# Patient Record
Sex: Female | Born: 1960
Health system: Southern US, Community
[De-identification: ages and names within clinical notes are randomized; demographics above are authoritative.]

## PROBLEM LIST (undated history)

## (undated) DIAGNOSIS — T7840XA Allergy, unspecified, initial encounter: Secondary | ICD-10-CM

## (undated) DIAGNOSIS — K589 Irritable bowel syndrome without diarrhea: Secondary | ICD-10-CM

## (undated) DIAGNOSIS — Z8669 Personal history of other diseases of the nervous system and sense organs: Secondary | ICD-10-CM

## (undated) DIAGNOSIS — R0600 Dyspnea, unspecified: Secondary | ICD-10-CM

## (undated) DIAGNOSIS — J189 Pneumonia, unspecified organism: Secondary | ICD-10-CM

## (undated) DIAGNOSIS — I509 Heart failure, unspecified: Secondary | ICD-10-CM

## (undated) DIAGNOSIS — F410 Panic disorder [episodic paroxysmal anxiety] without agoraphobia: Secondary | ICD-10-CM

## (undated) DIAGNOSIS — G4730 Sleep apnea, unspecified: Secondary | ICD-10-CM

## (undated) DIAGNOSIS — M1990 Unspecified osteoarthritis, unspecified site: Secondary | ICD-10-CM

## (undated) DIAGNOSIS — I10 Essential (primary) hypertension: Secondary | ICD-10-CM

## (undated) DIAGNOSIS — R9431 Abnormal electrocardiogram [ECG] [EKG]: Secondary | ICD-10-CM

## (undated) DIAGNOSIS — E119 Type 2 diabetes mellitus without complications: Secondary | ICD-10-CM

## (undated) DIAGNOSIS — I639 Cerebral infarction, unspecified: Secondary | ICD-10-CM

## (undated) DIAGNOSIS — F329 Major depressive disorder, single episode, unspecified: Secondary | ICD-10-CM

## (undated) DIAGNOSIS — N200 Calculus of kidney: Secondary | ICD-10-CM

## (undated) DIAGNOSIS — F419 Anxiety disorder, unspecified: Secondary | ICD-10-CM

## (undated) DIAGNOSIS — K219 Gastro-esophageal reflux disease without esophagitis: Secondary | ICD-10-CM

## (undated) DIAGNOSIS — E785 Hyperlipidemia, unspecified: Secondary | ICD-10-CM

## (undated) DIAGNOSIS — R569 Unspecified convulsions: Secondary | ICD-10-CM

## (undated) DIAGNOSIS — K760 Fatty (change of) liver, not elsewhere classified: Secondary | ICD-10-CM

## (undated) DIAGNOSIS — F32A Depression, unspecified: Secondary | ICD-10-CM

## (undated) DIAGNOSIS — Z87442 Personal history of urinary calculi: Secondary | ICD-10-CM

## (undated) HISTORY — PX: TUMOR REMOVAL: SHX12

## (undated) HISTORY — DX: Heart failure, unspecified: I50.9

## (undated) HISTORY — DX: Type 2 diabetes mellitus without complications: E11.9

## (undated) HISTORY — DX: Anxiety disorder, unspecified: F41.9

## (undated) HISTORY — DX: Dyspnea, unspecified: R06.00

## (undated) HISTORY — DX: Depression, unspecified: F32.A

## (undated) HISTORY — DX: Unspecified convulsions: R56.9

## (undated) HISTORY — DX: Abnormal electrocardiogram (ECG) (EKG): R94.31

## (undated) HISTORY — DX: Irritable bowel syndrome, unspecified: K58.9

## (undated) HISTORY — DX: Essential (primary) hypertension: I10

## (undated) HISTORY — DX: Major depressive disorder, single episode, unspecified: F32.9

## (undated) HISTORY — DX: Sleep apnea, unspecified: G47.30

## (undated) HISTORY — DX: Cerebral infarction, unspecified: I63.9

## (undated) HISTORY — DX: Calculus of kidney: N20.0

## (undated) HISTORY — DX: Gastro-esophageal reflux disease without esophagitis: K21.9

## (undated) HISTORY — DX: Hyperlipidemia, unspecified: E78.5

## (undated) HISTORY — DX: Allergy, unspecified, initial encounter: T78.40XA

---
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
## 1999-06-30 ENCOUNTER — Encounter (INDEPENDENT_AMBULATORY_CARE_PROVIDER_SITE_OTHER): Payer: Self-pay

## 1999-06-30 ENCOUNTER — Ambulatory Visit (HOSPITAL_COMMUNITY): Admission: RE | Admit: 1999-06-30 | Discharge: 1999-06-30 | Payer: Self-pay | Admitting: Gynecology

## 1999-09-12 HISTORY — PX: HYSTEROSCOPY: SHX211

## 2000-01-10 ENCOUNTER — Other Ambulatory Visit: Admission: RE | Admit: 2000-01-10 | Discharge: 2000-01-10 | Payer: Self-pay | Admitting: Gynecology

## 2000-09-11 HISTORY — PX: TOTAL ABDOMINAL HYSTERECTOMY: SHX209

## 2000-10-27 ENCOUNTER — Inpatient Hospital Stay (HOSPITAL_COMMUNITY): Admission: AD | Admit: 2000-10-27 | Discharge: 2000-10-27 | Payer: Self-pay | Admitting: Gynecology

## 2000-10-30 ENCOUNTER — Inpatient Hospital Stay (HOSPITAL_COMMUNITY): Admission: RE | Admit: 2000-10-30 | Discharge: 2000-11-01 | Payer: Self-pay | Admitting: Gynecology

## 2000-10-30 ENCOUNTER — Encounter (INDEPENDENT_AMBULATORY_CARE_PROVIDER_SITE_OTHER): Payer: Self-pay

## 2001-06-18 ENCOUNTER — Other Ambulatory Visit: Admission: RE | Admit: 2001-06-18 | Discharge: 2001-06-18 | Payer: Self-pay | Admitting: Gynecology

## 2002-07-15 ENCOUNTER — Other Ambulatory Visit: Admission: RE | Admit: 2002-07-15 | Discharge: 2002-07-15 | Payer: Self-pay | Admitting: Gynecology

## 2003-07-21 ENCOUNTER — Other Ambulatory Visit: Admission: RE | Admit: 2003-07-21 | Discharge: 2003-07-21 | Payer: Self-pay | Admitting: Gynecology

## 2003-09-23 ENCOUNTER — Ambulatory Visit (HOSPITAL_COMMUNITY): Admission: RE | Admit: 2003-09-23 | Discharge: 2003-09-23 | Payer: Self-pay | Admitting: Neurology

## 2004-03-09 ENCOUNTER — Observation Stay (HOSPITAL_COMMUNITY): Admission: AD | Admit: 2004-03-09 | Discharge: 2004-03-10 | Payer: Self-pay | Admitting: Cardiology

## 2004-08-03 ENCOUNTER — Other Ambulatory Visit: Admission: RE | Admit: 2004-08-03 | Discharge: 2004-08-03 | Payer: Self-pay | Admitting: Gynecology

## 2005-08-30 ENCOUNTER — Other Ambulatory Visit: Admission: RE | Admit: 2005-08-30 | Discharge: 2005-08-30 | Payer: Self-pay | Admitting: Gynecology

## 2006-09-17 ENCOUNTER — Other Ambulatory Visit: Admission: RE | Admit: 2006-09-17 | Discharge: 2006-09-17 | Payer: Self-pay | Admitting: Gynecology

## 2007-10-30 ENCOUNTER — Ambulatory Visit: Payer: Self-pay | Admitting: Cardiology

## 2008-09-14 ENCOUNTER — Ambulatory Visit: Payer: Self-pay | Admitting: Cardiology

## 2008-09-15 ENCOUNTER — Ambulatory Visit: Payer: Self-pay | Admitting: Cardiology

## 2009-06-08 DIAGNOSIS — R0602 Shortness of breath: Secondary | ICD-10-CM | POA: Insufficient documentation

## 2009-06-08 DIAGNOSIS — R9431 Abnormal electrocardiogram [ECG] [EKG]: Secondary | ICD-10-CM | POA: Insufficient documentation

## 2009-06-08 DIAGNOSIS — I10 Essential (primary) hypertension: Secondary | ICD-10-CM | POA: Insufficient documentation

## 2010-03-18 ENCOUNTER — Other Ambulatory Visit: Admission: RE | Admit: 2010-03-18 | Discharge: 2010-03-18 | Payer: Self-pay | Admitting: Gynecology

## 2010-03-18 ENCOUNTER — Ambulatory Visit: Payer: Self-pay | Admitting: Gynecology

## 2010-10-02 ENCOUNTER — Encounter: Payer: Self-pay | Admitting: Gynecology

## 2011-01-24 NOTE — Assessment & Plan Note (Signed)
[facility]                          [facility] OFFICE NOTE   NAME:[name], [name] FIFITA                        MRN:          [id number]  DATE:09/14/2008                            DOB:          December 09, 1960    HISTORY OF PRESENT ILLNESS:  The patient is a 50 year old female with a  history of normal coronary arteries by prior cardiac catheterization in  2005.  Of note is that the patient has some baseline abnormal  electrocardiogram with ST elevation in inferolateral leads.  QTc was  also slightly prolonged at 470 msec.  The patient's main complaint is  shortness of breath.  She states, on minimal exertion, she becomes very  dyspneic.  She has some chest tightness, the dyspnea is not severe.  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  The patient had admission in February  2009 at [facility] for dyspnea and chest pain.  She ruled out for  myocardial infarction.  She had a stress echo done which was within  normal limits.  The patient denies any orthopnea or PND.  She has no  palpitations or syncope.  She did go see Dr. Corliss Skains, Rheumatology,  and was noted to have low vitamin D levels.  She has been supplemented  and this helped significantly with her depression.   MEDICATIONS:  1. Diovan 80 mg p.o. nightly.  2. Maxzide 37.5/25 mg half a tablet p.o. daily.  3. Metoprolol tartrate 50 mg half a tablet p.o. daily.  4. Aspirin 81 mg a day.  5. Vitamin D 50,000 units q. week.  6. Lexapro 40 mg subcu q.a.m.   PHYSICAL EXAMINATION:  VITAL SIGNS:  Blood pressure is 120/80, heart  rate 75 beats per minute, weight is 172 pounds.  NECK:  Normal carotid upstroke.  No carotid bruits.  LUNGS:  Clear breath sounds bilaterally.  HEART:  Regular rate and rhythm.  Normal S1 and S2.  No murmur, rubs, or  gallops, particularly I do not hear an S4.  ABDOMEN:  Soft, nontender.  No rebound or  guarding.  Good bowel sounds.  EXTREMITIES:  No cyanosis, clubbing, or edema.  NEUROLOGIC:  The patient is alert and oriented and grossly nonfocal.   PROBLEM LIST:  1. Dyspnea.      a.     Normal coronary arteries by catheterization in 2005.      b.     Normal echo stress test in February 2009.      c.     Rule out diastolic heart failure.      d.     Rule out deconditioning.  2. History of depression.  3. Abnormal baseline electrocardiogram with ST elevation and prolonged      QT.  4. Hypertension.  Rule out hypertensive heart disease.   PLAN:  1. I suspect the patient's dyspnea is both the combination of      deconditioning as well as hypertensive heart disease and diastolic      heart failure.  I drew a BNP  level today.  We will just obtain a      formal echocardiographic study to particularly look at the      patient's diastolic parameters.  Of note is that on chest x-ray she      has cardiomegaly which could be secondary to biatrial enlargement.  2. The patient has a baseline abnormal electrocardiogram, but this has      unchanged from prior.  Her QTc is prolonged; however, we will check      a BMET to make sure also potassium levels are within normal limits.  3. If the cardiac workup is within normal limits.  The patient should      start a gradual exercise program and should try to lose weight as      this is likely contributing to her dyspnea.     Learta Codding, MD,FACC  Electronically Signed    GED/MedQ  DD: 09/14/2008  DT: 09/14/2008  Job #: [id number]   cc:   Doreen Beam, MD

## 2011-01-27 NOTE — Discharge Summary (Signed)
[facility]  Patient:    [name] [name], [name] [name]                        MRN: [id number] Adm. Date:  [id number] Disc. Date: [id number] Attending:  Merrily Pew Dictator:   Antony Contras, N.P.                           Discharge Summary  DISCHARGE DIAGNOSES:          Leiomyomata.  PROCEDURE:                    Total abdominal hysterectomy, bilateral salpingo-oophorectomy.  HISTORY OF PRESENT ILLNESS:   Patient is a 50 year old gravida 1, para 0, AB1 with a long history of menorrhagia with multiple myomas to include a submucous myoma.  Patient had a hysteroscopic resection about a year and a half ago with a regrowth of a submucous myoma.  Patient presents at this time for definitive surgery and desires both total abdominal hysterectomy and bilateral salpingo-oophorectomy in order to avoid the possibility of any further surgery.  HOSPITAL COURSE:              Patient was admitted October 30, 2000.  TAH/BSO was performed by Dr. Audie Box assisted by Dr. Farrel Gobble under general anesthesia.  Findings include a mildly enlarged uterus.  Ovaries and tubes were normal.  The right sigmoid had some adhesions and endo puckering, but no active disease.  Postoperatively patient remained afebrile, had no difficulty voiding.  Patient was able to be discharged in satisfactory condition on her second postoperative day.  LABORATORIES:                 CBC:  Hemoglobin 10.5, hematocrit 30.4, WBC 12.2, platelets 260,000.  DISPOSITION:                  Patient to follow-up in two weeks.  She was started on an estrogen patch 0.05.  Also placed on Tylox for pain. DD:  11/19/00 TD:  11/20/00 Job: [id number]

## 2011-01-27 NOTE — Cardiovascular Report (Signed)
NAME:  [name] [name], STOECKER                           ACCOUNT NO.:  [id number]   MEDICAL RECORD NO.:  [id number]                   PATIENT TYPE:  INP   LOCATION:  3727                                 FACILITY:  [facility]   PHYSICIAN:  Rollene Rotunda, M.D.                DATE OF BIRTH:  01/24/1961   DATE OF PROCEDURE:  03/10/2004  DATE OF DISCHARGE:                              CARDIAC CATHETERIZATION   PRIMARY CARE PHYSICIAN:  Doreen Beam, MD   CARDIOLOGIST:  Rollene Rotunda, M.D.   INDICATIONS:  Patient with chest pain suggestive of unstable angina.   PROCEDURE NOTE:  Left heart catheterization was performed via the right  femoral artery. The artery was cannulated using an anterior wall puncture. A  #6 French arterial sheath was inserted via the modified Seldinger technique.  Preformed Judkins and pigtail catheter were utilized.  The patient tolerated  the procedure well and left the lab in stable condition.   RESULTS   HEMODYNAMICS:  LV 162/23, AO 161/91.   CORONARIES:  1. The left main was normal.  2. The LAD was normal.  3. There was a large first diagonal which was normal.  4. There was a circumflex which was normal.  5. There was a large OM-1 and OM-2 which were normal.  6. The right coronary artery was dominant and normal.   LEFT VENTRICULOGRAM:  The left ventriculogram was obtained in the RAO  projection. The EF was 65% with normal wall motion.   CONCLUSION:  1. Normal coronaries.  2. Normal left ventricular function.   PLAN:  No further cardiac workup is suggested. The patient will be referred  back to the primary care doctor for evaluation of nonanginal chest pain.  She will resume her previous cardiac medications.                                               Rollene Rotunda, M.D.    JH/MEDQ  D:  03/10/2004  T:  03/10/2004  Job:  [id number]   cc:   Doreen Beam  [address]  [place]  [place] [address]  Fax: [phone]

## 2011-01-27 NOTE — Discharge Summary (Signed)
NAME:  [name] [name], [name] [name]                           ACCOUNT NO.:  [id number]   MEDICAL RECORD NO.:  [id number]                   PATIENT TYPE:  INP   LOCATION:  3727                                 FACILITY:  [facility]   PHYSICIAN:  [name] [name], M.D.                DATE OF BIRTH:  08/12/1961   DATE OF ADMISSION:  03/09/2004  DATE OF DISCHARGE:  03/10/2004                                 DISCHARGE SUMMARY   PROCEDURE:  1. Cardiac catheterization.  2. Coronary arteriogram.  3. Left ventriculogram.   HOSPITAL COURSE:  [name] [name] is a 50 year old female with no known history of  coronary artery disease. She has a history of hypertension and amaurosis  fugax as well as gastroesophageal reflux disease symptoms. She went to  [facility] for chest pain and was seen there by [name] [name] who  consulted cardiology. [name] [name] saw the patient and felt that her symptoms  were suspicious for angina pectous and that she had a strong family history  for premature coronary artery disease. It was felt that she needed further  evaluation and catheterization. She was transferred to [facility].   Her enzymes were negative for MI, and she had a cardiac catheterization March 10, 2004. She had no significant coronary artery disease, and her EF was  65%. [name] [name] evaluated the films and felt that no further cardiac  workup was indicated. He felt that she was okay for discharge and could  resume her previous medications. She is to follow up with primary care and  with cardiology on a p.r.n. basis.   DISCHARGE CONDITION:  Improved.   DISCHARGE DIAGNOSES:  1. Chest pain, no significant coronary artery disease by catheterization.  2. Hypertension.  3. Neck pain with a history of cervical degenerative joint disease.  4. History of migraines.  5. History of thoracic pain.  6. Amaurosis fugax.  7. Carpal tunnel.  8. Gastroesophageal reflux disease.  9. History of vasovagal syncope in May of 2004  with hypokalemia, conversion     reaction, and gait disorder.  10.      Allergies to DOXYCYCLINE, E-MYCIN, and CODEINE.  11.      Total history.  12.      Hyperlipidemia.   DISCHARGE INSTRUCTIONS:  Her activity level was to include no driving,  sexual, or strenuous activity for two days. She is to call the office with  problems with the catheterization site. She is to stick to a low fat diet.  She is to follow up with [name] [name] as needed. She is to call [name] [name] for  an appointment.   DISCHARGE MEDICATIONS:  1. Lexapro 40 mg q.h.s.  2. Diovan 80 mg 1 half tablet q.h.s.  3. Aspirin 81 mg q.h.s.  4. Triamterene hydrochlorothiazide 37.5/25 1 half tablet q.h.s.      [name] [name], P.A. [facility]  [name] [name], M.D.    RB/MEDQ  D:  03/10/2004  T:  03/11/2004  Job:  [id number]   cc:   [name] [name]  [address]  [place]  [place] [address]  Fax: [phone]   [facility]  [place], [place]

## 2011-01-27 NOTE — H&P (Signed)
[facility]  Patient:    KELIS, PLASSE                          MRN: [id number] Adm. Date:  10/30/00 Attending:  Marcial Pacas P. Fontaine, M.D.                         History and Physical  CHIEF COMPLAINT:              Menorrhagia.  HISTORY OF PRESENT ILLNESS:   A 50 year old, G1, P0, TAB 1 female with a long history of menorrhagia, dysmenorrhea and leiomyomata. The patient underwent hysteroscopic resection of a submucosa myoma in 2000 with initial response with decreased cramping, decreased bleeding but subsequently had increasing bleeding and cramping and underwent sonohysterogram October 2001 and was found to have an enlarge uterus with multiple myomas and after saline injection had an intercavitary myoma measuring 24 x 14 mm. Options for conservative therapy with rehysteroscopic resection versus definitive surgery to include hysterectomy was discussed and the patient elected for a hysterectomy. She is being followed for portal hypertension and had previously taken oral contraceptives in an attempt to control this years past but this negatively affected her blood pressure and she cannot tolerate significant hormonal manipulation. The patient has been anemic in the past and was begun on Lupron suppression over the past several months with hemoglobin check in January of 12.9. The patient is admitted at this time for a total abdominal hysterectomy, bilateral salpingo-oophorectomy.  PAST MEDICAL HISTORY:         Hypertension.  PAST SURGICAL HISTORY:        TAB, ankle surgery, ear surgery, hysteroscopy.  CURRENT MEDICATIONS:          Maxzide 25 mg 1/2 tab daily, Lotrel 5/20 mg one tablet daily, Nu-Iron one tablet daily, Lipitor 10 mg daily.  ALLERGIES:                    ERYTHROMYCIN, DOXYCYCLINE AND CODEINE.  SOCIAL HISTORY:               Noncontributory without alcohol or tobacco use.  FAMILY HISTORY:               Noncontributory.  ADMISSION  PHYSICAL EXAM:  HEENT:                        Normal.  LUNGS:                        Clear.  CARDIAC:                      Regular rate without rubs, murmurs or gallops.  ABDOMEN:                      Benign.  PELVIC:                       External BUS, vagina grossly normal. Cervix grossly normal. Uterus anteverted, slightly enlarged, nontender. Adnexa without masses or tenderness.  ASSESSMENT:                   A 50 year old, G1, P0, AB 1 female with a long history of menorrhagia with multiple myomas to include a submucosa myoma. The patients status post hysteroscopic resection a year and a half  ago with regrowth of the submucous myoma. Options to include rehysteroscopy up to and including hysterectomy were reviewed and the patient was to proceed with hysterectomy. The ovarian conservation issue was discussed with the patient and the options of maintaining her ovaries versus removing her ovaries were discussed and the risks and benefits of both approaches reviewed. The patient understands that if she keeps both ovaries she will have continued hormonal production but does have the risk of reoperation for both benign or malignant ovarian processes. If she has her ovaries removed then the issue of estrogen replacement therapy was discussed and the potential risks of estrogen replacement therapy were reviewed to include gallbladder disease, vascular thrombosis, as well as the possible increased risk of breast cancer. The options of removing her ovaries with no hormonal replacement was also discussed and the risks long term to her health were reviewed. The patient strongly desires to have both ovaries removed to avoid any potential for surgery in the future and she plans on initiating estrogen replacement therapy afterwards. The acute intraoperative and postoperative risks of the surgery were reviewed with her to include the risks of infection both generalized internal requiring  prolonged antibiotics as well as abscess requiring opening and draining of abscess formations. The risks of wound complications to include opening and draining of wound infections and closure by secondary intention was all discussed, understood and accepted. The risks of hemorrhage necessitating transfusion and the risk of transfusion were reviewed to include transfusion reaction, hepatitis, AIDs, as well as unknown diseases such as mad cow disease all of which was understood and accepted. The risks of inadvertent injury to internal organs including bowel, bladder, ureters, vessels, and nerves necessitating major exploratory repairative surgeries and future repairative surgeries including ostomy formation was discussed with her. The absolute irreversible sterility of hysterectomy was discussed with her, understood and accepted. Lastly the issues of sexuality following hysterectomy was discussed and the possibilities for orgasmic dysfunction as well as persistent dyspareunia was reviewed, understood, and accepted. The patients questions were answered to her satisfaction and she is ready to proceed with surgery. DD:  10/25/00 TD:  10/25/00 Job: [id number]

## 2011-03-17 ENCOUNTER — Encounter: Payer: Self-pay | Admitting: Cardiology

## 2012-07-17 ENCOUNTER — Other Ambulatory Visit: Payer: Self-pay | Admitting: Gynecology

## 2012-07-17 DIAGNOSIS — Z1231 Encounter for screening mammogram for malignant neoplasm of breast: Secondary | ICD-10-CM

## 2012-07-31 ENCOUNTER — Encounter: Payer: Self-pay | Admitting: Gynecology

## 2012-08-07 ENCOUNTER — Ambulatory Visit (HOSPITAL_COMMUNITY): Payer: Self-pay

## 2012-08-21 ENCOUNTER — Ambulatory Visit (HOSPITAL_COMMUNITY): Payer: Self-pay

## 2012-08-28 ENCOUNTER — Ambulatory Visit (HOSPITAL_COMMUNITY): Payer: Self-pay

## 2013-01-13 DIAGNOSIS — R079 Chest pain, unspecified: Secondary | ICD-10-CM

## 2013-01-14 ENCOUNTER — Encounter: Payer: Self-pay | Admitting: Cardiology

## 2013-01-14 DIAGNOSIS — R079 Chest pain, unspecified: Secondary | ICD-10-CM

## 2013-10-29 ENCOUNTER — Encounter: Payer: Self-pay | Admitting: Gastroenterology

## 2013-12-02 ENCOUNTER — Ambulatory Visit: Payer: 59 | Admitting: Gastroenterology

## 2013-12-23 ENCOUNTER — Ambulatory Visit: Payer: 59 | Admitting: Gastroenterology

## 2013-12-24 ENCOUNTER — Ambulatory Visit: Payer: Self-pay | Admitting: Gynecology

## 2014-01-07 ENCOUNTER — Ambulatory Visit: Payer: Self-pay | Admitting: Gynecology

## 2014-05-11 ENCOUNTER — Encounter: Payer: Self-pay | Admitting: Gastroenterology

## 2014-07-13 ENCOUNTER — Other Ambulatory Visit (INDEPENDENT_AMBULATORY_CARE_PROVIDER_SITE_OTHER): Payer: 59

## 2014-07-13 ENCOUNTER — Ambulatory Visit (INDEPENDENT_AMBULATORY_CARE_PROVIDER_SITE_OTHER): Payer: 59 | Admitting: Gastroenterology

## 2014-07-13 ENCOUNTER — Encounter: Payer: Self-pay | Admitting: Gastroenterology

## 2014-07-13 VITALS — BP 138/90 | HR 64 | Ht 61.0 in | Wt 173.6 lb

## 2014-07-13 DIAGNOSIS — R197 Diarrhea, unspecified: Secondary | ICD-10-CM

## 2014-07-13 LAB — COMPREHENSIVE METABOLIC PANEL
ALT: 54 U/L — ABNORMAL HIGH (ref 0–35)
AST: 56 U/L — ABNORMAL HIGH (ref 0–37)
Albumin: 3.3 g/dL — ABNORMAL LOW (ref 3.5–5.2)
Alkaline Phosphatase: 101 U/L (ref 39–117)
BUN: 12 mg/dL (ref 6–23)
CO2: 30 mEq/L (ref 19–32)
Calcium: 9.3 mg/dL (ref 8.4–10.5)
Chloride: 101 mEq/L (ref 96–112)
Creatinine, Ser: 0.7 mg/dL (ref 0.4–1.2)
GFR: 88.12 mL/min (ref >60.00)
Glucose, Bld: 109 mg/dL — ABNORMAL HIGH (ref 70–99)
Potassium: 3.3 mEq/L — ABNORMAL LOW (ref 3.5–5.1)
Sodium: 138 mEq/L (ref 135–145)
Total Bilirubin: 0.5 mg/dL (ref 0.2–1.2)
Total Protein: 7 g/dL (ref 6.0–8.3)

## 2014-07-13 LAB — CBC WITH DIFFERENTIAL/PLATELET
Basophils Absolute: 0.1 10*3/uL (ref 0.0–0.1)
Basophils Relative: 0.8 % (ref 0.0–3.0)
Eosinophils Absolute: 0.2 10*3/uL (ref 0.0–0.7)
Eosinophils Relative: 2.3 % (ref 0.0–5.0)
HCT: 39.7 % (ref 36.0–46.0)
Hemoglobin: 13.3 g/dL (ref 12.0–15.0)
Lymphocytes Relative: 28.8 % (ref 12.0–46.0)
Lymphs Abs: 2.2 10*3/uL (ref 0.7–4.0)
MCHC: 33.5 g/dL (ref 30.0–36.0)
MCV: 88.6 fl (ref 78.0–100.0)
Monocytes Absolute: 0.5 10*3/uL (ref 0.1–1.0)
Monocytes Relative: 7 % (ref 3.0–12.0)
Neutro Abs: 4.8 10*3/uL (ref 1.4–7.7)
Neutrophils Relative %: 61.1 % (ref 43.0–77.0)
Platelets: 216 10*3/uL (ref 150.0–400.0)
RBC: 4.48 Mil/uL (ref 3.87–5.11)
RDW: 14.6 % (ref 11.5–15.5)
WBC: 7.8 10*3/uL (ref 4.0–10.5)

## 2014-07-13 LAB — SEDIMENTATION RATE: Sed Rate: 22 mm/hr (ref 0–22)

## 2014-07-13 MED ORDER — MOVIPREP 100 G PO SOLR: 1.0000 | Freq: Once | ORAL | Status: DC

## 2014-07-13 NOTE — Progress Notes (Signed)
HPI: This is a    Very pleasant 53 year old woman whom I am meeting for the first time today  More frequent than usual BMs (4-5 times per day) has been going for 2 years.  Usually just once per day.  Usually very loose, non-bloody.  She will take imodium about 2 times per week.  No real urgency associated.  Non bloody.  Drinks 3 sodas per day.  NO etoh.  Overall her weight has been up recently.  Never had colonoscopy  No FH of colon cancer.  Review of systems: Pertinent positive and negative review of systems were noted in the above HPI section. Complete review of systems was performed and was otherwise normal.    Past Medical History  Diagnosis Date  . Hypertension     Unspecified  . Abnormal EKG   . Dyspnea   . Depression   . Stroke     Past Surgical History  Procedure Laterality Date  . Tumor removal      from ear  . Total abdominal hysterectomy      Current Outpatient Prescriptions  Medication Sig Dispense Refill  . ALPRAZolam (XANAX) 0.5 MG tablet   5  . aspirin 81 MG tablet Take 81 mg by mouth daily.    . bisoprolol-hydrochlorothiazide (ZIAC) 10-6.25 MG per tablet   12  . FLUoxetine (PROZAC) 20 MG capsule   11  . omeprazole (PRILOSEC) 20 MG capsule   9  . verapamil (CALAN-SR) 120 MG CR tablet   11   No current facility-administered medications for this visit.    Allergies as of 07/13/2014 - Review Complete 07/13/2014  Allergen Reaction Noted  . Doxycycline Rash 07/13/2014  . Erythromycin Rash 07/13/2014    Family History  Problem Relation Age of Onset  . Coronary artery disease Paternal Grandfather   . Uterine cancer Mother   . Heart disease Maternal Grandmother   . Irritable bowel syndrome Mother     History   Social History  . Marital Status: Married    Spouse Name: N/A    Number of Children: N/A  . Years of Education: N/A   Occupational History  . Full Time     Sales   Social History Main Topics  . Smoking status: Never Smoker   .  Smokeless tobacco: Never Used  . Alcohol Use: No  . Drug Use: No  . Sexual Activity: Not on file   Other Topics Concern  . Not on file   Social History Narrative   Regular exercise: No       Physical Exam: BP 138/90 mmHg  Pulse 64  Ht 5\' 1"  (1.549 m)  Wt 173 lb 9.6 oz (78.744 kg)  BMI 32.82 kg/m2 Constitutional: generally well-appearing Psychiatric: alert and oriented x3 Eyes: extraocular movements intact Mouth: oral pharynx moist, no lesions Neck: supple no lymphadenopathy Cardiovascular: heart regular rate and rhythm Lungs: clear to auscultation bilaterally Abdomen: soft, nontender, nondistended, no obvious ascites, no peritoneal signs, normal bowel sounds Extremities: no lower extremity edema bilaterally Skin: no lesions on visible extremities    Assessment and plan: 53 y.o. female with   Frequent bowel movements for the past 2 years  She really has chronic diarrhea with multiple loose stools daily for about 2 years. I recommend we proceed with colonoscopy as well as some basic blood tests including a CBC, complete in about profile, celiac panel, sedimentation rate. She understands and wishes to proceed.

## 2014-07-13 NOTE — Patient Instructions (Addendum)
You can take take imodium daily if needed.  You have been scheduled for a colonoscopy. Please follow written instructions given to you at your visit today.  Please pick up your prep kit at the pharmacy within the next 1-3 days. If you use inhalers (even only as needed), please bring them with you on the day of your procedure. Your physician has requested that you go to www.startemmi.com and enter the access code given to you at your visit today. This web site gives a general overview about your procedure. However, you should still follow specific instructions given to you by our office regarding your preparation for the procedure.  Your physician has requested that you go to the basement for the following lab work before leaving today: CBC, CMET, Celiac 10, Sed Rate

## 2014-07-14 LAB — CELIAC PANEL 10
Endomysial Screen: NEGATIVE
Gliadin IgA: 10.7 U/mL (ref <20)
Gliadin IgG: 10.4 U/mL (ref <20)
IgA: 117 mg/dL (ref 69–380)
Tissue Transglut Ab: 15.5 U/mL (ref <20)
Tissue Transglutaminase Ab, IgA: 5.6 U/mL (ref <20)

## 2014-07-21 ENCOUNTER — Other Ambulatory Visit: Payer: Self-pay

## 2014-07-21 DIAGNOSIS — R7401 Elevation of levels of liver transaminase levels: Secondary | ICD-10-CM

## 2014-07-21 DIAGNOSIS — R740 Nonspecific elevation of levels of transaminase and lactic acid dehydrogenase [LDH]: Principal | ICD-10-CM

## 2014-07-23 ENCOUNTER — Encounter: Payer: Self-pay | Admitting: Gastroenterology

## 2014-09-14 ENCOUNTER — Encounter: Payer: 59 | Admitting: Gastroenterology

## 2014-11-04 ENCOUNTER — Telehealth: Payer: Self-pay | Admitting: Gastroenterology

## 2014-11-04 NOTE — Telephone Encounter (Signed)
Patient had called due to rectal bleeding. I had left her message to call back when I got her voicemail.

## 2014-11-13 ENCOUNTER — Encounter: Payer: Self-pay | Admitting: Gastroenterology

## 2014-11-20 ENCOUNTER — Telehealth: Payer: Self-pay | Admitting: Gastroenterology

## 2014-11-20 ENCOUNTER — Encounter: Payer: Self-pay | Admitting: Nurse Practitioner

## 2014-11-20 ENCOUNTER — Ambulatory Visit (INDEPENDENT_AMBULATORY_CARE_PROVIDER_SITE_OTHER): Payer: 59 | Admitting: Nurse Practitioner

## 2014-11-20 VITALS — BP 120/76 | HR 76 | Ht 61.0 in | Wt 173.0 lb

## 2014-11-20 DIAGNOSIS — K648 Other hemorrhoids: Secondary | ICD-10-CM

## 2014-11-20 DIAGNOSIS — K625 Hemorrhage of anus and rectum: Secondary | ICD-10-CM

## 2014-11-20 MED ORDER — HYDROCORTISONE ACETATE 25 MG RE SUPP: 25.0000 mg | Freq: Every day | RECTAL | Status: DC

## 2014-11-20 NOTE — Patient Instructions (Signed)
You have been scheduled for a colonoscopy. Please follow written instructions given to you at your visit today.  Please pick up your prep supplies at the pharmacy within the next 1-3 days. If you use inhalers (even only as needed), please bring them with you on the day of your procedure. Your physician has requested that you go to www.startemmi.com and enter the access code given to you at your visit today. This web site gives a general overview about your procedure. However, you should still follow specific instructions given to you by our office regarding your preparation for the procedure.  We have sent the following medications to your pharmacy for you to pick up at your convenience: Anusol [facility] suppositories- Insert one suppository per rectum every night x 7 nights.  If insurance will not pay, may use preparation H suppositories 1% per rectum once per night x 7 nights instead.

## 2014-11-20 NOTE — Progress Notes (Signed)
     History of Present Illness:   Patient is a 54 year old female known to Dr. Ardis Hughs. He evaluated her in November of last year for diarrhea. Labs obtained were remarkable for mild transaminitis. Her white count and hgb were normal. Diarrhea was actually more of a chronic issue. Patient was scheduled for a colonoscopy but has cancelled a few times. She is rescheduled for May but is worked in for rectal bleeding.   Patient was hospitalized in [place] late last month for generalized mid to lower abdominal and rectal bleeding. Her hemoglobin was 12. CT scan of the abdomen and pelvis negative for any acute findings per admission H &P. Patient was seen by GI apparently. Per patient, colonoscopy offered but she wanted to have it done outpatient with [place]. Patient had no further bleeding until yesterday when she saw blood in her  underwear   . Current Medications, Allergies, Past Medical History, Past Surgical History, Family History and Social History were reviewed in [employer] record.  Physical Exam: General: Pleasant, obese white female in no acute distress Head: Normocephalic and atraumatic Eyes:  sclerae anicteric, conjunctiva pink  Ears: Normal auditory acuity Lungs: Clear throughout to auscultation Heart: Regular rate and rhythm Abdomen: Soft, obese, mild LLQ tenderness. No masses, no hepatomegaly. Normal bowel sounds Rectal: External hemorrhoids. No obvious fissures. Anal vaults tight, walls felt edematous / full.  Musculoskeletal: Symmetrical with no gross deformities  Extremities: No edema  Neurological: Alert oriented x 4, grossly nonfocal Psychological:  Alert and cooperative. Normal mood and affect  Assessment and Recommendations:   54 year old female with recent admission for abdominal pain and rectal bleeding. Infectious seems unlikely in absence of diarrhea. Diverticular bleed seems unlikey given the pain. Ischemic colitis would fit though CTscan  was normal. She does have hemorrhoids which may have been ource of bleeding but that doesn't explain abdominal pain. Patient already scheduled for colonoscopy in May, will try and have this moved closer. The risks, benefits, and alternatives to colonoscopy with possible biopsy and possible polypectomy were discussed with the patient and she consents to proceed. Trial of Anusol HC supp for hemorrhoids.

## 2014-11-20 NOTE — Telephone Encounter (Signed)
Pt states she had some rectal bleeding last night and today her stomach is very bloated. Pt scheduled to see Tye Savoy NP today at 1:30pm, pt aware.

## 2014-11-21 ENCOUNTER — Telehealth: Payer: Self-pay | Admitting: Internal Medicine

## 2014-11-21 NOTE — Telephone Encounter (Signed)
Pt called Still having rectal bleeding, bright red Painful when passing stool then throbbing for some time after Using suppository recently rx'ed No abd pain, no fever, chills Expect fissure or spasm Would likely benefit from ntg 0.125% ointment TID Did not want to come to specialty pharmacy today as she lives in [place], [place] Will ask Patty or Paula/Regina to call her Monday and will defer to Dr. Ardis Hughs re: med management, colon is already scheduled per patient

## 2014-11-22 ENCOUNTER — Encounter: Payer: Self-pay | Admitting: Nurse Practitioner

## 2014-11-22 DIAGNOSIS — K625 Hemorrhage of anus and rectum: Secondary | ICD-10-CM | POA: Insufficient documentation

## 2014-11-22 DIAGNOSIS — K648 Other hemorrhoids: Secondary | ICD-10-CM | POA: Insufficient documentation

## 2014-11-22 NOTE — Telephone Encounter (Signed)
Patient called over weekend with more bleeding and now having RECTAL pain (new). She is using anusol supp for internal hemorrhoids She may have a fissure. Please call in Diltiazem gel for her to apply about one inch up into anus TID for 4 weeks. Margaree Mackintosh bath tid may help.   Thanks

## 2014-11-23 ENCOUNTER — Telehealth: Payer: Self-pay | Admitting: Nurse Practitioner

## 2014-11-23 MED ORDER — DILTIAZEM GEL 2 %: CUTANEOUS | Status: DC

## 2014-11-23 NOTE — Telephone Encounter (Signed)
See phone note from 11/21/14 for additional notes.

## 2014-11-23 NOTE — Addendum Note (Signed)
Addended by: Marlon Pel on: 11/23/2014 09:48 AM   Modules accepted: Orders

## 2014-11-23 NOTE — Telephone Encounter (Signed)
Patient notified of the recommendations.  She will call back for additional questions or concerns.

## 2014-11-23 NOTE — Progress Notes (Signed)
I agree with the above note, plan. She no showed for previous colonoscopy, we'll try to get her in sooner than her currently scheduled May colonoscopy. Should be possible.

## 2014-12-04 ENCOUNTER — Encounter (HOSPITAL_COMMUNITY): Payer: Self-pay | Admitting: Emergency Medicine

## 2014-12-04 ENCOUNTER — Emergency Department (HOSPITAL_COMMUNITY)
Admission: EM | Admit: 2014-12-04 | Discharge: 2014-12-04 | Disposition: A | Discharge status: Home / Self Care | Payer: 59 | Attending: Emergency Medicine | Admitting: Emergency Medicine

## 2014-12-04 DIAGNOSIS — Z8673 Personal history of transient ischemic attack (TIA), and cerebral infarction without residual deficits: Secondary | ICD-10-CM | POA: Diagnosis not present

## 2014-12-04 DIAGNOSIS — Z7952 Long term (current) use of systemic steroids: Secondary | ICD-10-CM | POA: Insufficient documentation

## 2014-12-04 DIAGNOSIS — Z7982 Long term (current) use of aspirin: Secondary | ICD-10-CM | POA: Insufficient documentation

## 2014-12-04 DIAGNOSIS — R109 Unspecified abdominal pain: Secondary | ICD-10-CM | POA: Diagnosis not present

## 2014-12-04 DIAGNOSIS — F329 Major depressive disorder, single episode, unspecified: Secondary | ICD-10-CM | POA: Diagnosis not present

## 2014-12-04 DIAGNOSIS — K625 Hemorrhage of anus and rectum: Secondary | ICD-10-CM

## 2014-12-04 DIAGNOSIS — I10 Essential (primary) hypertension: Secondary | ICD-10-CM | POA: Insufficient documentation

## 2014-12-04 DIAGNOSIS — K644 Residual hemorrhoidal skin tags: Secondary | ICD-10-CM | POA: Insufficient documentation

## 2014-12-04 DIAGNOSIS — Z79899 Other long term (current) drug therapy: Secondary | ICD-10-CM | POA: Insufficient documentation

## 2014-12-04 LAB — URINALYSIS, ROUTINE W REFLEX MICROSCOPIC
Bilirubin Urine: NEGATIVE
Glucose, UA: NEGATIVE mg/dL
Hgb urine dipstick: NEGATIVE
Ketones, ur: NEGATIVE mg/dL
Leukocytes, UA: NEGATIVE
Nitrite: NEGATIVE
Protein, ur: NEGATIVE mg/dL
Specific Gravity, Urine: 1.012 (ref 1.005–1.030)
Urobilinogen, UA: 0.2 mg/dL (ref 0.0–1.0)
pH: 5.5 (ref 5.0–8.0)

## 2014-12-04 LAB — COMPREHENSIVE METABOLIC PANEL
ALT: 58 U/L — ABNORMAL HIGH (ref 0–35)
AST: 70 U/L — ABNORMAL HIGH (ref 0–37)
Albumin: 4 g/dL (ref 3.5–5.2)
Alkaline Phosphatase: 107 U/L (ref 39–117)
Anion gap: 10 (ref 5–15)
BUN: 14 mg/dL (ref 6–23)
CO2: 28 mmol/L (ref 19–32)
Calcium: 9.2 mg/dL (ref 8.4–10.5)
Chloride: 103 mmol/L (ref 96–112)
Creatinine, Ser: 0.65 mg/dL (ref 0.50–1.10)
GFR calc Af Amer: >90 mL/min (ref >90)
GFR calc non Af Amer: >90 mL/min (ref >90)
Glucose, Bld: 153 mg/dL — ABNORMAL HIGH (ref 70–99)
Potassium: 3.4 mmol/L — ABNORMAL LOW (ref 3.5–5.1)
Sodium: 141 mmol/L (ref 135–145)
Total Bilirubin: 0.6 mg/dL (ref 0.3–1.2)
Total Protein: 7.2 g/dL (ref 6.0–8.3)

## 2014-12-04 LAB — CBC WITH DIFFERENTIAL/PLATELET
Basophils Absolute: 0 10*3/uL (ref 0.0–0.1)
Basophils Relative: 0 % (ref 0–1)
Eosinophils Absolute: 0.1 10*3/uL (ref 0.0–0.7)
Eosinophils Relative: 1 % (ref 0–5)
HCT: 36.8 % (ref 36.0–46.0)
Hemoglobin: 12 g/dL (ref 12.0–15.0)
Lymphocytes Relative: 31 % (ref 12–46)
Lymphs Abs: 2 10*3/uL (ref 0.7–4.0)
MCH: 29.9 pg (ref 26.0–34.0)
MCHC: 32.6 g/dL (ref 30.0–36.0)
MCV: 91.8 fL (ref 78.0–100.0)
Monocytes Absolute: 0.4 10*3/uL (ref 0.1–1.0)
Monocytes Relative: 6 % (ref 3–12)
Neutro Abs: 3.9 10*3/uL (ref 1.7–7.7)
Neutrophils Relative %: 62 % (ref 43–77)
Platelets: 209 10*3/uL (ref 150–400)
RBC: 4.01 MIL/uL (ref 3.87–5.11)
RDW: 13.1 % (ref 11.5–15.5)
WBC: 6.4 10*3/uL (ref 4.0–10.5)

## 2014-12-04 LAB — LIPASE, BLOOD: Lipase: 20 U/L (ref 11–59)

## 2014-12-04 MED ORDER — DOCUSATE SODIUM 100 MG PO CAPS: 100.0000 mg | ORAL_CAPSULE | Freq: Two times a day (BID) | ORAL | Status: DC

## 2014-12-04 MED ORDER — HYDROCORTISONE ACETATE 25 MG RE SUPP: 25.0000 mg | Freq: Every day | RECTAL | Status: DC

## 2014-12-04 NOTE — ED Notes (Addendum)
Pt from home c/o rectal bleeding intermittently x few weeks. She reports having BRB on paper when she wiped. C/o lower abdominal cramping

## 2014-12-04 NOTE — Discharge Instructions (Signed)
Rectal Bleeding Rectal bleeding is when blood passes out of the anus. It is usually a sign that something is wrong. It may not be serious, but it should always be evaluated. Rectal bleeding may present as bright red blood or extremely dark stools. The color may range from dark red or maroon to black (like tar). It is important that the cause of rectal bleeding be identified so treatment can be started and the problem corrected. CAUSES   Hemorrhoids. These are enlarged (dilated) blood vessels or veins in the anal or rectal area.  Fistulas. Theseare abnormal, burrowing channels that usually run from inside the rectum to the skin around the anus. They can bleed.  Anal fissures. This is a tear in the tissue of the anus. Bleeding occurs with bowel movements.  Diverticulosis. This is a condition in which pockets or sacs project from the bowel wall. Occasionally, the sacs can bleed.  Diverticulitis. Thisis an infection involving diverticulosis of the colon.  Proctitis and colitis. These are conditions in which the rectum, colon, or both, can become inflamed and pitted (ulcerated).  Polyps and cancer. Polyps are non-cancerous (benign) growths in the colon that may bleed. Certain types of polyps turn into cancer.  Protrusion of the rectum. Part of the rectum can project from the anus and bleed.  Certain medicines.  Intestinal infections.  Blood vessel abnormalities. HOME CARE INSTRUCTIONS  Eat a high-fiber diet to keep your stool soft.  Limit activity.  Drink enough fluids to keep your urine clear or pale yellow.  Warm baths may be useful to soothe rectal pain.  Follow up with your caregiver as directed. SEEK IMMEDIATE MEDICAL CARE IF:  You develop increased bleeding.  You have black or dark red stools.  You vomit blood or material that looks like coffee grounds.  You have abdominal pain or tenderness.  You have a fever.  You feel weak, nauseous, or you faint.  You have  severe rectal pain or you are unable to have a bowel movement. MAKE SURE YOU:  Understand these instructions.  Will watch your condition.  Will get help right away if you are not doing well or get worse. Document Released: 02/17/2002 Document Revised: 11/20/2011 Document Reviewed: 02/12/2011 [facility] Patient Information 2015 [place], [place]. This information is not intended to replace advice given to you by your health care provider. Make sure you discuss any questions you have with your health care provider. See your Gi doctor for evaluation. Try Recticare over the counter to help discomfort.

## 2014-12-04 NOTE — ED Notes (Signed)
Pt comes from home where she lives with her husband with c/o rectal bleeding x4 weeks, worsening today.  Pt was admitted to [facility] for rectal bleeding @ 4 weeks ago and evaluated overnight.  Patient states scans were negative and she was referred to [facility].  Patient has followed up with them x1 and was prescribed diltiazem gel.  Pt states she has been using that as prescribed.  Patient states this afternoon she had a BM with a clot of medium red blood.  Patient also endorses diarrhea.  Patient denies N/V and fever.  On exam, patient's lung sounds are clear in all lobes.  Heart sounds WNL, S1/S2, no gallop, rub or murmur.  +3 radial and pedal pulses.  Patient's abdomen soft and tender to palpation, >periumbilical.  Bowel sounds hypoactive.  Mucus membranes are moist.  No pallor noted to conjunctiva or gums.

## 2014-12-04 NOTE — ED Provider Notes (Signed)
CSN: [id number]     Arrival date & time 12/04/14  1402 History   First MD Initiated Contact with Patient 12/04/14 1513     Chief Complaint  Patient presents with  . Rectal Bleeding  . Abdominal Pain     (Consider location/radiation/quality/duration/timing/severity/associated sxs/prior Treatment) Patient is a 54 y.o. female presenting with hematochezia and abdominal pain. The history is provided by the patient. No language interpreter was used.  Rectal Bleeding Quality:  Bright red Duration:  4 weeks Timing:  Sporadic Progression:  Worsening Chronicity:  Recurrent Context: hemorrhoids and rectal pain   Pain details:    Quality:  Aching   Severity:  Moderate   Duration:  4 weeks   Timing:  Constant   Progression:  Worsening Similar prior episodes: no   Relieved by:  Nothing Associated symptoms: abdominal pain   Risk factors: no anticoagulant use   Abdominal Pain Associated symptoms: hematochezia     Past Medical History  Diagnosis Date  . Hypertension     Unspecified  . Abnormal EKG   . Dyspnea   . Depression   . Stroke    Past Surgical History  Procedure Laterality Date  . Tumor removal      from ear  . Total abdominal hysterectomy     Family History  Problem Relation Age of Onset  . Coronary artery disease Paternal Grandfather   . Uterine cancer Mother   . Heart disease Maternal Grandmother   . Irritable bowel syndrome Mother    History  Substance Use Topics  . Smoking status: Never Smoker   . Smokeless tobacco: Never Used  . Alcohol Use: No   OB History    No data available     Review of Systems  Gastrointestinal: Positive for abdominal pain, blood in stool, hematochezia and rectal pain.  All other systems reviewed and are negative.     Allergies  Doxycycline and Erythromycin  Home Medications   Prior to Admission medications   Medication Sig Start Date End Date Taking? Authorizing Provider  ALPRAZolam Duanne Moron) 0.5 MG tablet Take 0.5 mg  by mouth 3 (three) times daily as needed for anxiety.  05/19/14  Yes Historical Provider, MD  aspirin 81 MG tablet Take 81 mg by mouth daily.   Yes Historical Provider, MD  bisoprolol-hydrochlorothiazide (ZIAC) 10-6.25 MG per tablet Take 1 tablet by mouth every morning.  07/02/14  Yes Historical Provider, MD  diltiazem 2 % GEL Apply one inch into anus three times a day for 4 weeks. 11/23/14  Yes Willia Craze, NP  FLUoxetine (PROZAC) 20 MG capsule Take 60 mg by mouth every morning.  06/24/14  Yes Historical Provider, MD  omeprazole (PRILOSEC) 20 MG capsule Take 20 mg by mouth at bedtime.  07/02/14  Yes Historical Provider, MD  verapamil (CALAN-SR) 120 MG CR tablet Take 120 mg by mouth at bedtime.  07/02/14  Yes Historical Provider, MD  hydrocortisone (ANUSOL-HC) 25 MG suppository Place 1 suppository (25 mg total) rectally at bedtime. Patient not taking: Reported on 12/04/2014 11/20/14   Willia Craze, NP  MOVIPREP 100 G SOLR Take 1 kit (200 g total) by mouth once. Patient not taking: Reported on 12/04/2014 07/13/14   Milus Banister, MD   BP 160/93 mmHg  Pulse 72  Temp(Src) 97.7 F (36.5 C) (Oral)  Resp 18  SpO2 98% Physical Exam  Constitutional: She is oriented to person, place, and time. She appears well-developed and well-nourished.  HENT:  Head: Normocephalic and atraumatic.  Eyes: Pupils are equal, round, and reactive to light.  Neck: Normal range of motion.  Cardiovascular: Normal rate and normal heart sounds.   Pulmonary/Chest: Effort normal.  Abdominal: Soft. There is no tenderness.  Genitourinary:  Rectal  Large external non thrombosed hemorrhoids, no gross blood, rectal exam palp internal hemorrhoids, painful,   Musculoskeletal: Normal range of motion.  Neurological: She is alert and oriented to person, place, and time.  Skin: Skin is warm.  Psychiatric: She has a normal mood and affect.  Nursing note and vitals reviewed.   ED Course  Procedures (including critical care  time) Labs Review Labs Reviewed  COMPREHENSIVE METABOLIC PANEL - Abnormal; Notable for the following:    Potassium 3.4 (*)    Glucose, Bld 153 (*)    AST 70 (*)    ALT 58 (*)    All other components within normal limits  CBC WITH DIFFERENTIAL/PLATELET  LIPASE, BLOOD  URINALYSIS, ROUTINE W REFLEX MICROSCOPIC    Imaging Review No results found.   EKG Interpretation None      MDM  I spoke to Dr. Hoyt Koch on call for Dr. Ardis Hughs.  She advised Recticare, anusol suppositories, colace and sitz baths.   Pt advised to call her Gi doctor on Monday to be seen   Final diagnoses:  Rectal bleeding    Results for orders placed or performed during the hospital encounter of 12/04/14  CBC with Differential  Result Value Ref Range   WBC 6.4 4.0 - 10.5 K/uL   RBC 4.01 3.87 - 5.11 MIL/uL   Hemoglobin 12.0 12.0 - 15.0 g/dL   HCT 36.8 36.0 - 46.0 %   MCV 91.8 78.0 - 100.0 fL   MCH 29.9 26.0 - 34.0 pg   MCHC 32.6 30.0 - 36.0 g/dL   RDW 13.1 11.5 - 15.5 %   Platelets 209 150 - 400 K/uL   Neutrophils Relative % 62 43 - 77 %   Neutro Abs 3.9 1.7 - 7.7 K/uL   Lymphocytes Relative 31 12 - 46 %   Lymphs Abs 2.0 0.7 - 4.0 K/uL   Monocytes Relative 6 3 - 12 %   Monocytes Absolute 0.4 0.1 - 1.0 K/uL   Eosinophils Relative 1 0 - 5 %   Eosinophils Absolute 0.1 0.0 - 0.7 K/uL   Basophils Relative 0 0 - 1 %   Basophils Absolute 0.0 0.0 - 0.1 K/uL  Comprehensive metabolic panel  Result Value Ref Range   Sodium 141 135 - 145 mmol/L   Potassium 3.4 (L) 3.5 - 5.1 mmol/L   Chloride 103 96 - 112 mmol/L   CO2 28 19 - 32 mmol/L   Glucose, Bld 153 (H) 70 - 99 mg/dL   BUN 14 6 - 23 mg/dL   Creatinine, Ser 0.65 0.50 - 1.10 mg/dL   Calcium 9.2 8.4 - 10.5 mg/dL   Total Protein 7.2 6.0 - 8.3 g/dL   Albumin 4.0 3.5 - 5.2 g/dL   AST 70 (H) 0 - 37 U/L   ALT 58 (H) 0 - 35 U/L   Alkaline Phosphatase 107 39 - 117 U/L   Total Bilirubin 0.6 0.3 - 1.2 mg/dL   GFR calc non Af Amer >90 >90 mL/min   GFR calc Af  Amer >90 >90 mL/min   Anion gap 10 5 - 15  Lipase, blood  Result Value Ref Range   Lipase 20 11 - 59 U/L  Urinalysis, Routine w reflex microscopic  Result Value Ref Range  Color, Urine YELLOW YELLOW   APPearance CLEAR CLEAR   Specific Gravity, Urine 1.012 1.005 - 1.030   pH 5.5 5.0 - 8.0   Glucose, UA NEGATIVE NEGATIVE mg/dL   Hgb urine dipstick NEGATIVE NEGATIVE   Bilirubin Urine NEGATIVE NEGATIVE   Ketones, ur NEGATIVE NEGATIVE mg/dL   Protein, ur NEGATIVE NEGATIVE mg/dL   Urobilinogen, UA 0.2 0.0 - 1.0 mg/dL   Nitrite NEGATIVE NEGATIVE   Leukocytes, UA NEGATIVE NEGATIVE   No results found. Meds ordered this encounter  Medications  . hydrocortisone (ANUSOL-HC) 25 MG suppository    Sig: Place 1 suppository (25 mg total) rectally at bedtime.    Dispense:  7 suppository    Refill:  0    Order Specific Question:  Supervising Provider    Answer:  MILLER, BRIAN [[id number]]  . docusate sodium (COLACE) 100 MG capsule    Sig: Take 1 capsule (100 mg total) by mouth every 12 (twelve) hours.    Dispense:  60 capsule    Refill:  0    Order Specific Question:  Supervising Provider    Answer:  Noemi Chapel [[id number]]      Hollace Kinnier [place], PA-C 12/04/14 1639  Debby Freiberg, MD 12/05/14 [phone]

## 2014-12-04 NOTE — ED Notes (Signed)
Nurse drawing labs. 

## 2014-12-07 ENCOUNTER — Telehealth: Payer: Self-pay | Admitting: Gastroenterology

## 2014-12-07 NOTE — Telephone Encounter (Signed)
Pt was seen in the ER over the holiday for rectal bleeding. Pt states she is still bleeding and would like to be seen. Pt scheduled to see Alonza Bogus PA tomorrow at 2:30pm. Pt aware of appt.

## 2014-12-08 ENCOUNTER — Telehealth: Payer: Self-pay | Admitting: *Deleted

## 2014-12-08 ENCOUNTER — Encounter: Payer: Self-pay | Admitting: Gastroenterology

## 2014-12-08 ENCOUNTER — Ambulatory Visit (INDEPENDENT_AMBULATORY_CARE_PROVIDER_SITE_OTHER): Payer: 59 | Admitting: Gastroenterology

## 2014-12-08 VITALS — BP 160/90 | HR 88 | Ht 61.0 in | Wt 175.2 lb

## 2014-12-08 DIAGNOSIS — K625 Hemorrhage of anus and rectum: Secondary | ICD-10-CM | POA: Diagnosis not present

## 2014-12-08 DIAGNOSIS — K648 Other hemorrhoids: Secondary | ICD-10-CM

## 2014-12-08 DIAGNOSIS — K644 Residual hemorrhoidal skin tags: Secondary | ICD-10-CM | POA: Insufficient documentation

## 2014-12-08 NOTE — Patient Instructions (Addendum)
We have given you a work note for Monday 12-07-2014.   We have given you rectal care instructions/Sitz bath ,  We will call you with the appointment for a consult with [facility] for external hemorrhoids.

## 2014-12-08 NOTE — Progress Notes (Signed)
     12/08/2014 [name] [name] [id number] 03/09/1961   History of Present Illness:  This is a 54 year old female who was seen here earlier this month by our NP for rectal bleeding.  Has never undergone colonoscopy and that is scheduled with Dr. Ardis Hughs for 4/8.  She was examined and was given hydrocortisone suppositories to use.  She says that despite those she continues to have pain and bleeding.  Passed a clot the other day and went to the ED; Hgb at that time was 12.0 grams.  She called back here at one point and was given diltiazem gel to use as well.  She says that she is frustrated and wants something done with her hemorrhoids.  Says that it feels like there is a ball between her butt-cheeks because of the hemorrhoids.   Current Medications, Allergies, Past Medical History, Past Surgical History, Family History and Social History were reviewed in [employer] record.   Physical Exam: BP 160/90 mmHg  Pulse 88  Ht 5\' 1"  (1.549 m)  Wt 175 lb 4 oz (79.493 kg)  BMI 33.13 kg/m2 General: Well developed white female in no acute distress; very anxious Head: Normocephalic and atraumatic Eyes:  Sclerae anicteric, conjunctiva pink  Ears: Normal auditory acuity Rectal:  Large external hemorrhoids noted.  Some discomfort on DRE posteriorly.  No stool or blood noted on exam glove. Musculoskeletal: Symmetrical with no gross deformities  Extremities: No edema  Neurological: Alert oriented x 4, grossly non-focal Psychological:  Alert and cooperative. Normal mood and affect  Assessment and Recommendations: -54 year old female with rectal bleeding and pain:  She does have large external hemorrhoids on exam and no doubt could be causing her some discomfort.  The bleeding and actual pain may be coming from internal hemorrhoids and possibly an anal fissure.  Will continue diltiazem gel 2% TID and hydrocortisone suppositories at bedtime for now along with sitz baths.  Proceed with  colonoscopy on 4/8 with Dr. Ardis Hughs to rule out other sources of bleeding as well.  She is adamant about having something done with her hemorrhoids.  Will refer to [facility] as well for evaluation.  Ok to give a work note for missing work yesterday due to her recent issues.

## 2014-12-08 NOTE — Telephone Encounter (Signed)
Called pt to inform her the consult with [facility] is scheduled for 12-28-2014 . She is to arrive at 8:30. Will have to fill out some patient paperwork. Pt verbalized understanding.She will see Dr. Rosendo Gros.

## 2014-12-09 NOTE — Progress Notes (Signed)
i agree with the above note, plan 

## 2014-12-18 ENCOUNTER — Encounter: Payer: Self-pay | Admitting: Gastroenterology

## 2014-12-18 ENCOUNTER — Ambulatory Visit (AMBULATORY_SURGERY_CENTER): Payer: 59 | Admitting: Gastroenterology

## 2014-12-18 VITALS — BP 138/62 | HR 66 | Temp 97.4°F | Resp 18 | Ht 61.0 in | Wt 175.0 lb

## 2014-12-18 DIAGNOSIS — D125 Benign neoplasm of sigmoid colon: Secondary | ICD-10-CM | POA: Diagnosis not present

## 2014-12-18 DIAGNOSIS — K648 Other hemorrhoids: Secondary | ICD-10-CM

## 2014-12-18 DIAGNOSIS — D120 Benign neoplasm of cecum: Secondary | ICD-10-CM

## 2014-12-18 DIAGNOSIS — K644 Residual hemorrhoidal skin tags: Secondary | ICD-10-CM

## 2014-12-18 DIAGNOSIS — Z1211 Encounter for screening for malignant neoplasm of colon: Secondary | ICD-10-CM | POA: Diagnosis present

## 2014-12-18 MED ORDER — SODIUM CHLORIDE 0.9 % IV SOLN: 500.0000 mL | INTRAVENOUS | Status: DC

## 2014-12-18 NOTE — Patient Instructions (Signed)
Impressions/recommendations:  Polyps (handout given) Hemorrhoids (handout given)  YOU HAD AN ENDOSCOPIC PROCEDURE TODAY AT [place]:   Refer to the procedure report that was given to you for any specific questions about what was found during the examination.  If the procedure report does not answer your questions, please call your gastroenterologist to clarify.  If you requested that your care partner not be given the details of your procedure findings, then the procedure report has been included in a sealed envelope for you to review at your convenience later.  YOU SHOULD EXPECT: Some feelings of bloating in the abdomen. Passage of more gas than usual.  Walking can help get rid of the air that was put into your GI tract during the procedure and reduce the bloating. If you had a lower endoscopy (such as a colonoscopy or flexible sigmoidoscopy) you may notice spotting of blood in your stool or on the toilet paper. If you underwent a bowel prep for your procedure, you may not have a normal bowel movement for a few days.  Please Note:  You might notice some irritation and congestion in your nose or some drainage.  This is from the oxygen used during your procedure.  There is no need for concern and it should clear up in a day or so.  SYMPTOMS TO REPORT IMMEDIATELY:   Following lower endoscopy (colonoscopy or flexible sigmoidoscopy):  Excessive amounts of blood in the stool  Significant tenderness or worsening of abdominal pains  Swelling of the abdomen that is new, acute  Fever of 100F or higher   For urgent or emergent issues, a gastroenterologist can be reached at any hour by calling [phone].   DIET: Your first meal following the procedure should be a small meal and then it is ok to progress to your normal diet. Heavy or fried foods are harder to digest and may make you feel nauseous or bloated.  Likewise, meals heavy in dairy and vegetables can increase bloating.   Drink plenty of fluids but you should avoid alcoholic beverages for 24 hours.  ACTIVITY:  You should plan to take it easy for the rest of today and you should NOT DRIVE or use heavy machinery until tomorrow (because of the sedation medicines used during the test).    FOLLOW UP: Our staff will call the number listed on your records the next business day following your procedure to check on you and address any questions or concerns that you may have regarding the information given to you following your procedure. If we do not reach you, we will leave a message.  However, if you are feeling well and you are not experiencing any problems, there is no need to return our call.  We will assume that you have returned to your regular daily activities without incident.  If any biopsies were taken you will be contacted by phone or by letter within the next 1-3 weeks.  Please call us at [phone] if you have not heard about the biopsies in 3 weeks.    SIGNATURES/CONFIDENTIALITY: You and/or your care partner have signed paperwork which will be entered into your electronic medical record.  These signatures attest to the fact that that the information above on your After Visit Summary has been reviewed and is understood.  Full responsibility of the confidentiality of this discharge information lies with you and/or your care-partner.

## 2014-12-18 NOTE — Progress Notes (Signed)
A/ox3 pleased with MAC, report to Tracy RN 

## 2014-12-18 NOTE — Progress Notes (Signed)
Called to room to assist during endoscopic procedure.  Patient ID and intended procedure confirmed with present staff. Received instructions for my participation in the procedure from the performing physician.  

## 2014-12-18 NOTE — Op Note (Signed)
[facility]  [address] [place], [address]   COLONOSCOPY PROCEDURE REPORT  PATIENT: [name] [name], [name] [name]  MR#: [id number] BIRTHDATE: [birth year] , 53  yrs. old GENDER: female ENDOSCOPIST: Milus Banister, MD REFERRED QB:[id number] Woody Seller, M.D. PROCEDURE DATE:  12/18/2014 PROCEDURE:   Colonoscopy with snare polypectomy First Screening Colonoscopy - Avg.  risk and is 50 yrs.  old or older Yes.  Prior Negative Screening - Now for repeat screening. N/A  History of Adenoma - Now for follow-up colonoscopy & has been > or = to 3 yrs.  N/A ASA CLASS:   Class II INDICATIONS:Screening for colonic neoplasia. MEDICATIONS: Monitored anesthesia care and Propofol 200 mg IV  DESCRIPTION OF PROCEDURE:   After the risks benefits and alternatives of the procedure were thoroughly explained, informed consent was obtained.  The digital rectal exam revealed no abnormalities of the rectum.   The LB [id number]  endoscope was introduced through the anus and advanced to the cecum, which was identified by both the appendix and ileocecal valve. No adverse events experienced.   The quality of the prep was excellent.  The instrument was then slowly withdrawn as the colon was fully examined.  COLON FINDINGS: Fourpolyps ranging between 3-24mm in size were found in the sigmoid colon and at the cecum.  The largest was in the sigmoid, was pedunculated and was removed with snare/cautery (jar 2) . The otherwse were all sessile, removed with cold snare (jar 1).  The resection was complete, the polyp tissue was completely retrieved and sent to histology.   Large external and small internal hemorrhoids were found.  Retroflexed views revealed no abnormalities. The time to cecum = 2.8 Withdrawal time = 11.6   The scope was withdrawn and the procedure completed. COMPLICATIONS: There were no immediate complications.  ENDOSCOPIC IMPRESSION: 1.  Four sessile polyps ranging between 3-1mm in size were found  in the sigmoid colon and at the cecum; polypectomies were performed with a cold snare 2.   External and internal hemorrhoids  RECOMMENDATIONS: 1. If the polyp(s) removed today are proven to be adenomatous (pre-cancerous) polyps, you will need a colonoscopy in 3 years. Otherwise you should continue to follow colorectal cancer screening guidelines for "routine risk" patients with a colonoscopy in 10 years.  You will receive a letter within 1-2 weeks with the results of your biopsy as well as final recommendations.  Please call my office if you have not received a letter after 3 weeks. 2. Please continue with general surgery appt to discuss your large external hemorrhoids.  eSigned:  Milus Banister, MD 12/18/2014 2:56 PM

## 2014-12-21 ENCOUNTER — Telehealth: Payer: Self-pay | Admitting: *Deleted

## 2014-12-21 NOTE — Telephone Encounter (Signed)
  Follow up Call-  Call back number 12/18/2014  Post procedure Call Back phone  # [phone]  Permission to leave phone message Yes     Patient questions:  Do you have a fever, pain , or abdominal swelling? No. Pain Score  0 *  Have you tolerated food without any problems? Yes.    Have you been able to return to your normal activities? Yes.    Do you have any questions about your discharge instructions: Diet   No. Medications  No. Follow up visit  No.  Do you have questions or concerns about your Care? No.  Actions: * If pain score is 4 or above: No action needed, pain <4.

## 2014-12-24 ENCOUNTER — Encounter: Payer: Self-pay | Admitting: Gastroenterology

## 2015-01-25 ENCOUNTER — Encounter: Payer: Self-pay | Admitting: Gastroenterology

## 2015-02-19 NOTE — Telephone Encounter (Signed)
Pt seen

## 2015-03-29 ENCOUNTER — Telehealth: Payer: Self-pay | Admitting: Gastroenterology

## 2015-03-29 NOTE — Telephone Encounter (Signed)
See alternate note  

## 2015-03-29 NOTE — Telephone Encounter (Signed)
Pt cx her surgical consult, she was advised to follow up with that office.  I offered to call [facility] for her and she states she will call herself and get that set up.  Pt was given the phone number and told to call back with any concerns or problems

## 2015-09-08 ENCOUNTER — Telehealth: Payer: Self-pay | Admitting: Gastroenterology

## 2015-09-08 NOTE — Telephone Encounter (Signed)
Call placed to [facility] and office note to be faxed to the office.

## 2015-09-08 NOTE — Telephone Encounter (Signed)
Dr Ardis Hughs the pt is continuing to have rectal bleeding and cramps with external hemorrhoids.  I have placed the last [facility] office note on your desk for review.  She was last seen in our office on 12/18/14.  Do you want her to be seen by extender or your next available?

## 2015-09-08 NOTE — Telephone Encounter (Signed)
Per last note pt was referred to [facility] for rectal bleeding, she cx the appt.  I advised her when she last called to follow up with [facility].  Message left for her to return call and to confirm if she saw [facility].

## 2015-09-08 NOTE — Telephone Encounter (Signed)
Pt said she talked with [facility] and they said they do not recommend surgery at this time

## 2015-09-09 NOTE — Telephone Encounter (Signed)
Yes, offer her both (my next avail and next extender appt, whichever she prefers)  thanks

## 2015-09-09 NOTE — Telephone Encounter (Signed)
The pt has been scheduled for 11/08/15 with Dr Ardis Hughs per her preference.

## 2015-09-30 ENCOUNTER — Telehealth: Payer: Self-pay | Admitting: Gastroenterology

## 2015-09-30 ENCOUNTER — Other Ambulatory Visit (INDEPENDENT_AMBULATORY_CARE_PROVIDER_SITE_OTHER): Payer: 59

## 2015-09-30 ENCOUNTER — Encounter: Payer: Self-pay | Admitting: Gastroenterology

## 2015-09-30 ENCOUNTER — Ambulatory Visit (INDEPENDENT_AMBULATORY_CARE_PROVIDER_SITE_OTHER): Payer: 59 | Admitting: Gastroenterology

## 2015-09-30 VITALS — BP 144/84 | HR 70 | Ht 61.0 in | Wt 168.0 lb

## 2015-09-30 DIAGNOSIS — K644 Residual hemorrhoidal skin tags: Secondary | ICD-10-CM

## 2015-09-30 DIAGNOSIS — K648 Other hemorrhoids: Secondary | ICD-10-CM

## 2015-09-30 LAB — CBC WITH DIFFERENTIAL/PLATELET
Basophils Absolute: 0 10*3/uL (ref 0.0–0.1)
Basophils Relative: 0.4 % (ref 0.0–3.0)
Eosinophils Absolute: 0.1 10*3/uL (ref 0.0–0.7)
Eosinophils Relative: 1.2 % (ref 0.0–5.0)
HCT: 36.2 % (ref 36.0–46.0)
Hemoglobin: 12.1 g/dL (ref 12.0–15.0)
Lymphocytes Relative: 33.2 % (ref 12.0–46.0)
Lymphs Abs: 1.8 10*3/uL (ref 0.7–4.0)
MCHC: 33.3 g/dL (ref 30.0–36.0)
MCV: 85.6 fl (ref 78.0–100.0)
Monocytes Absolute: 0.4 10*3/uL (ref 0.1–1.0)
Monocytes Relative: 7.6 % (ref 3.0–12.0)
Neutro Abs: 3.1 10*3/uL (ref 1.4–7.7)
Neutrophils Relative %: 57.6 % (ref 43.0–77.0)
Platelets: 155 10*3/uL (ref 150.0–400.0)
RBC: 4.23 Mil/uL (ref 3.87–5.11)
RDW: 14 % (ref 11.5–15.5)
WBC: 5.4 10*3/uL (ref 4.0–10.5)

## 2015-09-30 NOTE — Telephone Encounter (Signed)
[name] [name] I found this note open in the chart nothing was documented in it.  The pt left a message on my voice mail that she missed you call as well

## 2015-09-30 NOTE — Progress Notes (Signed)
Review of pertinent gastrointestinal problems: 1. Precancerous colon polyps:  Colonoscopy Dr. Ardis Hughs 12/2014 found 5 polyps, 4 were precancerous, recommended recall colonoscopy at 3 year interval. 2. Internal/external hemorrhoids: large external, small internal.  Was referred to [facility] 04/2015 who recommended topical therapies.  HPI: This is a  very pleasant 55 year old woman whom I last saw the time of a colonoscopy several months ago.  Chief complaint is continued rectal bleeding  She does not have any problems with constipation. She has fairly soft stools once a day to every few days. She has significant dripping of blood from her hemorrhoids once to twice a week. She has no anal pain from them. She has general fatigue. No CBC in several with months.     Past Medical History  Diagnosis Date  . Hypertension     Unspecified  . Abnormal EKG   . Dyspnea   . Depression   . Stroke ([place])   . Sleep apnea     no CPAP worn    Past Surgical History  Procedure Laterality Date  . Tumor removal      from ear  . Total abdominal hysterectomy      Current Outpatient Prescriptions  Medication Sig Dispense Refill  . ALPRAZolam (XANAX) 0.5 MG tablet Take 0.5 mg by mouth 3 (three) times daily as needed for anxiety.   5  . aspirin 81 MG tablet Take 81 mg by mouth daily.    . bisoprolol-hydrochlorothiazide (ZIAC) 10-6.25 MG per tablet Take 1 tablet by mouth every morning.   12  . FLUoxetine (PROZAC) 20 MG capsule Take 60 mg by mouth every morning.   11  . omeprazole (PRILOSEC) 20 MG capsule Take 20 mg by mouth at bedtime.   9  . verapamil (CALAN-SR) 120 MG CR tablet Take 120 mg by mouth at bedtime.   11   No current facility-administered medications for this visit.    Allergies as of 09/30/2015 - Review Complete 09/30/2015  Allergen Reaction Noted  . Doxycycline Rash 07/13/2014  . Erythromycin Rash 07/13/2014    Family History  Problem Relation Age of Onset  . Coronary artery  disease Paternal Grandfather   . Uterine cancer Mother   . Irritable bowel syndrome Mother   . Heart disease Maternal Grandmother   . Colitis Neg Hx   . Esophageal cancer Neg Hx   . Stomach cancer Neg Hx   . Rectal cancer Neg Hx     Social History   Social History  . Marital Status: Married    Spouse Name: N/A  . Number of Children: N/A  . Years of Education: N/A   Occupational History  . Full Time     Sales   Social History Main Topics  . Smoking status: Never Smoker   . Smokeless tobacco: Never Used  . Alcohol Use: No  . Drug Use: No  . Sexual Activity: Not on file   Other Topics Concern  . Not on file   Social History Narrative   Regular exercise: No     Physical Exam: BP 144/84 mmHg  Pulse 70  Ht 5\' 1"  (1.549 m)  Wt 168 lb (76.204 kg)  BMI 31.76 kg/m2 Constitutional: generally well-appearing Psychiatric: alert and oriented x3 Abdomen: soft, nontender, nondistended, no obvious ascites, no peritoneal signs, normal bowel sounds Rectal examination with female assistant in the room: Medium to large sized external hemorrhoids which are nonthrombosed. She has at least 3 or 4 hemorrhoidal protuberances, no anal fissures, digital rectal  exam revealed no internal masses, no distal rectal masses.  Assessment and plan: 55 y.o. female with persistently bleeding external hemorrhoids  She does not have any significant constipation to try to adjust. I am going to put her on fiber supplement once daily since she has somewhat loose stools, this may help a bit with the looseness keep her from having a white a bit.  She has dripping of blood into the toilet at least once a week and I do believe that the external hemorrhoids or other cause. She will get a CBC today to see if she is anemic. We are going to refer her back to [facility] surgery, Dr. Rosendo Gros to reconsider hemorrhoidal therapies.   Owens Loffler, MD [facility] 09/30/2015, 3:12 PM

## 2015-09-30 NOTE — Telephone Encounter (Signed)
Pt returned call and was scheduled to see Dr Ardis Hughs today at 3 pm for rectal bleeding

## 2015-09-30 NOTE — Patient Instructions (Addendum)
Return to see Dr. Rosendo Gros at [facility] we will help set up the appt.  The pt has a balance at [facility] and they need her to call the office before an appt can be made. You will have labs checked today in the basement lab.  Please head down after you check out with the front desk ([facility]). Please start taking citrucel (orange flavored) powder fiber supplement.  This may cause some bloating at first but that usually goes away. Begin with a small spoonful and work your way up to a large, heaping spoonful daily over a week.

## 2015-10-01 ENCOUNTER — Encounter: Payer: Self-pay | Admitting: Gastroenterology

## 2015-11-08 ENCOUNTER — Ambulatory Visit: Payer: 59 | Admitting: Gastroenterology

## 2015-11-16 ENCOUNTER — Encounter: Payer: Self-pay | Admitting: Gynecology

## 2015-11-16 ENCOUNTER — Ambulatory Visit (INDEPENDENT_AMBULATORY_CARE_PROVIDER_SITE_OTHER): Payer: 59 | Admitting: Gynecology

## 2015-11-16 VITALS — BP 130/80 | Ht 61.0 in | Wt 164.0 lb

## 2015-11-16 DIAGNOSIS — Z01419 Encounter for gynecological examination (general) (routine) without abnormal findings: Secondary | ICD-10-CM

## 2015-11-16 DIAGNOSIS — N951 Menopausal and female climacteric states: Secondary | ICD-10-CM | POA: Diagnosis not present

## 2015-11-16 NOTE — Progress Notes (Signed)
    Italie Lejeune Clarey 28-Aug-1961 [id number]        55 y.o.  G0P0000  for annual exam.  Has not been in the office since 2011. Several issues noted below.  Past medical history,surgical history, problem list, medications, allergies, family history and social history were all reviewed and documented as reviewed in the EPIC chart.  ROS:  Performed with pertinent positives and negatives included in the history, assessment and plan.   Additional significant findings :  none   Exam: Caryn Bee assistant Filed Vitals:   11/16/15 1445  BP: 130/80  Height: 5\' 1"  (1.549 m)  Weight: 164 lb (74.39 kg)   General appearance:  Normal affect, orientation and appearance. Skin: Grossly normal HEENT: Without gross lesions.  No cervical or supraclavicular adenopathy. Thyroid normal.  Lungs:  Clear without wheezing, rales or rhonchi Cardiac: RR, without RMG Abdominal:  Soft, nontender, without masses, guarding, rebound, organomegaly or hernia Breasts:  Examined lying and sitting without masses, retractions, discharge or axillary adenopathy. Pelvic:  Ext/BUS/vagina normal with mild atrophic changes. Pap smear of cuff done  Adnexa without masses or tenderness    Anus and perineum normal   Rectovaginal normal sphincter tone without palpated masses or tenderness.    Assessment/Plan:  55 y.o. G0P0000 female for annual exam.   1. Postmenopausal/mild atrophic changes. Status post TAH/BSO 2002 for menorrhagia. Final pathology showed leiomyomata, adenomyosis and simple and complex endometrial hyperplasia without atypia. Had been on ERT afterwards but discontinued 2004 when she developed a stroke thought to be secondary to hypertension. Had done well until over the past year she's developed more hot flashes and sweats.  Otherwise doing well. Currently on Prozac and Wellbutrin by her psychiatrist for depression. Reviewed options with the patient. Given her stroke history do not feel ERT wise at this time.  Alternatives such as Effexor also discussed but again being on several psychiatric medications do not feel this is appropriate. Recommended she try OTC soy based products such as Estrovin and she is going to go ahead and try this. She is seeing her internist tomorrow and have recommended that she ask them to check her thyroid just to make sure this is not the issue when she agrees to discuss this with him. Will follow up with me if this continues to be a significant issue. 2. Mammography several years ago. Patient knows she is overdue and agrees to schedule. Names and numbers provided. SBE month are reviewed. 3. Pap smear 2011. Pap smear of cuff done today. No history of significant abnormal Pap smears. Options to stop screening altogether given she is status post hysterectomy for benign indications versus less frequent screening intervals reviewed. Will readdress on an annual basis. 4. Colonoscopy 2016. Repeat at their recommended interval. 5. DEXA reported 2015 normal. Recommend repeat at age 84. Increased calcium and vitamin D. 6. Health maintenance. No routine blood work done as patient reports this done at her primary physician's office. Follow up 1 year, sooner as needed.   Anastasio Auerbach MD, 3:16 PM 11/16/2015

## 2015-11-16 NOTE — Patient Instructions (Signed)
Try over-the-counter soy-based products for the hot flashes such as Estrovin  Call to Schedule your mammogram  Facilities in [place]: 1)  [facility]. [address] [place] [address] [place] Phone: [phone] 2)  Dr. Isaiah Blakes at [facility] [address] Phone: [phone]     Mammogram A mammogram is an X-ray test to find changes in a woman's breast. You should get a mammogram if:  You are 56 years of age or older  You have risk factors.   Your doctor recommends that you have one.  BEFORE THE TEST  Do not schedule the test the week before your period, especially if your breasts are sore during this time.  On the day of your mammogram:  Wash your breasts and armpits well. After washing, do not put on any deodorant or talcum powder on until after your test.   Eat and drink as you usually do.   Take your medicines as usual.   If you are diabetic and take insulin, make sure you:   Eat before coming for your test.   Take your insulin as usual.   If you cannot keep your appointment, call before the appointment to cancel. Schedule another appointment.  TEST  You will need to undress from the waist up. You will put on a hospital gown.   Your breast will be put on the mammogram machine, and it will press firmly on your breast with a piece of plastic called a compression paddle. This will make your breast flatter so that the machine can X-ray all parts of your breast.   Both breasts will be X-rayed. Each breast will be X-rayed from above and from the side. An X-ray might need to be taken again if the picture is not good enough.   The mammogram will last about 15 to 30 minutes.  AFTER THE TEST Finding out the results of your test Ask when your test results will be ready. Make sure you get your test results.  Document Released: 11/24/2008 Document Revised: 08/17/2011 Document Reviewed: 11/24/2008 [facility] Patient  Information 2012 [place].

## 2015-11-16 NOTE — Addendum Note (Signed)
Addended by: Nelva Nay on: 11/16/2015 03:45 PM   Modules accepted: Orders

## 2015-11-17 LAB — URINALYSIS W MICROSCOPIC + REFLEX CULTURE
Bacteria, UA: NONE SEEN [HPF]
Bilirubin Urine: NEGATIVE
Casts: NONE SEEN [LPF]
Crystals: NONE SEEN [HPF]
Glucose, UA: NEGATIVE
Hgb urine dipstick: NEGATIVE
Ketones, ur: NEGATIVE
Leukocytes, UA: NEGATIVE
Nitrite: NEGATIVE
Protein, ur: NEGATIVE
RBC / HPF: NONE SEEN RBC/HPF (ref <=2)
Specific Gravity, Urine: 1.016 (ref 1.001–1.035)
Squamous Epithelial / LPF: NONE SEEN [HPF] (ref <=5)
WBC, UA: NONE SEEN WBC/HPF (ref <=5)
Yeast: NONE SEEN [HPF]
pH: 6 (ref 5.0–8.0)

## 2015-11-17 LAB — PAP IG W/ RFLX HPV ASCU

## 2015-11-18 ENCOUNTER — Encounter (HOSPITAL_COMMUNITY): Payer: Self-pay | Admitting: Emergency Medicine

## 2015-11-18 ENCOUNTER — Emergency Department (HOSPITAL_COMMUNITY)
Admission: EM | Admit: 2015-11-18 | Discharge: 2015-11-18 | Disposition: A | Discharge status: Home / Self Care | Payer: 59 | Attending: Emergency Medicine | Admitting: Emergency Medicine

## 2015-11-18 DIAGNOSIS — Z8673 Personal history of transient ischemic attack (TIA), and cerebral infarction without residual deficits: Secondary | ICD-10-CM | POA: Insufficient documentation

## 2015-11-18 DIAGNOSIS — F329 Major depressive disorder, single episode, unspecified: Secondary | ICD-10-CM | POA: Insufficient documentation

## 2015-11-18 DIAGNOSIS — I10 Essential (primary) hypertension: Secondary | ICD-10-CM | POA: Insufficient documentation

## 2015-11-18 DIAGNOSIS — Z5321 Procedure and treatment not carried out due to patient leaving prior to being seen by health care provider: Secondary | ICD-10-CM | POA: Insufficient documentation

## 2015-11-18 DIAGNOSIS — R109 Unspecified abdominal pain: Secondary | ICD-10-CM | POA: Insufficient documentation

## 2015-11-18 LAB — URINALYSIS, ROUTINE W REFLEX MICROSCOPIC
Bilirubin Urine: NEGATIVE
Glucose, UA: NEGATIVE mg/dL
Hgb urine dipstick: NEGATIVE
Ketones, ur: NEGATIVE mg/dL
Leukocytes, UA: NEGATIVE
Nitrite: NEGATIVE
Protein, ur: NEGATIVE mg/dL
Specific Gravity, Urine: >1.03 — ABNORMAL HIGH (ref 1.005–1.030)
pH: 5.5 (ref 5.0–8.0)

## 2015-11-18 NOTE — ED Notes (Signed)
Pt left before being seen by provider per registration.

## 2015-11-18 NOTE — ED Notes (Signed)
Having left flank pain for last two days.  Seen PCP yesterday and given percocet.  Having increasing pain to left flank area.  Rates pain 8/10.  History of kidney stones.

## 2016-03-06 ENCOUNTER — Other Ambulatory Visit: Payer: Self-pay | Admitting: Gynecology

## 2016-03-06 ENCOUNTER — Telehealth: Payer: Self-pay | Admitting: *Deleted

## 2016-03-06 DIAGNOSIS — N644 Mastodynia: Secondary | ICD-10-CM

## 2016-03-06 NOTE — Telephone Encounter (Signed)
Pt called c/o breast tenderness x 2 days now, requesting order for breast center. I advised pt to schedule OV with provider as this is a new problem. Transferred to front desk.

## 2016-03-07 ENCOUNTER — Ambulatory Visit: Payer: 59 | Admitting: Gynecology

## 2016-03-24 ENCOUNTER — Telehealth: Payer: Self-pay | Admitting: Gastroenterology

## 2016-03-24 NOTE — Telephone Encounter (Signed)
Pt has pain below the belly button on the left side, walking makes it hurt worse and very sore.  Saw PCP and had a CT that was normal other than "fatty liver" .  She will have the result faxed to our office.  The pain is off/on, was recently put on metformin 2 weeks ago and the pain began shortly after beginning the medication.  Dr Woody Seller at [facility] prescribed the metformin.  Pt was advised to see keep appt with Dr Woody Seller on Monday and advise him of the pain and when it started.  I will also inform Dr Ardis Hughs for further recommendations.

## 2016-03-27 NOTE — Telephone Encounter (Signed)
i agree.  Lets see what IM says about the metformin.  Thanks

## 2016-07-03 ENCOUNTER — Other Ambulatory Visit: Payer: Self-pay | Admitting: Gynecology

## 2016-07-03 DIAGNOSIS — Z1231 Encounter for screening mammogram for malignant neoplasm of breast: Secondary | ICD-10-CM

## 2016-07-17 ENCOUNTER — Ambulatory Visit
Admission: RE | Admit: 2016-07-17 | Discharge: 2016-07-17 | Disposition: A | Discharge status: Home / Self Care | Payer: 59 | Source: Ambulatory Visit | Attending: Gynecology | Admitting: Gynecology

## 2016-07-17 DIAGNOSIS — Z1231 Encounter for screening mammogram for malignant neoplasm of breast: Secondary | ICD-10-CM

## 2016-07-21 ENCOUNTER — Other Ambulatory Visit: Payer: Self-pay | Admitting: Gynecology

## 2016-07-21 DIAGNOSIS — R928 Other abnormal and inconclusive findings on diagnostic imaging of breast: Secondary | ICD-10-CM

## 2016-07-25 ENCOUNTER — Other Ambulatory Visit: Payer: Self-pay

## 2016-07-25 ENCOUNTER — Other Ambulatory Visit: Payer: Self-pay | Admitting: Gynecology

## 2016-07-25 DIAGNOSIS — R928 Other abnormal and inconclusive findings on diagnostic imaging of breast: Secondary | ICD-10-CM

## 2016-07-26 ENCOUNTER — Ambulatory Visit
Admission: RE | Admit: 2016-07-26 | Discharge: 2016-07-26 | Disposition: A | Discharge status: Home / Self Care | Payer: 59 | Source: Ambulatory Visit | Attending: Gynecology | Admitting: Gynecology

## 2016-07-26 DIAGNOSIS — R928 Other abnormal and inconclusive findings on diagnostic imaging of breast: Secondary | ICD-10-CM

## 2016-12-05 ENCOUNTER — Other Ambulatory Visit (HOSPITAL_COMMUNITY): Payer: Self-pay | Admitting: Psychiatry

## 2016-12-29 ENCOUNTER — Telehealth: Payer: Self-pay | Admitting: *Deleted

## 2016-12-29 NOTE — Telephone Encounter (Signed)
-----   Message from Arnoldo Lenis, MD sent at 12/29/2016  2:21 PM EDT ----- Can put on for first available, if nothing within 2 weeks let me know  JB ----- Message ----- From: Massie Maroon, CMA Sent: 12/29/2016  10:21 AM To: Arnoldo Lenis, MD  This pt is requesting to see you for SOB. Her husband sees you. This would be a self referral.   Hardie Pulley

## 2016-12-29 NOTE — Telephone Encounter (Signed)
Pt scheduled for 5/7 - pt made aware of appt

## 2017-01-09 ENCOUNTER — Ambulatory Visit: Payer: 59 | Admitting: Gastroenterology

## 2017-01-12 ENCOUNTER — Encounter: Payer: Self-pay | Admitting: *Deleted

## 2017-01-15 ENCOUNTER — Telehealth: Payer: Self-pay | Admitting: Cardiology

## 2017-01-15 ENCOUNTER — Encounter: Payer: Self-pay | Admitting: Cardiology

## 2017-01-15 ENCOUNTER — Ambulatory Visit (INDEPENDENT_AMBULATORY_CARE_PROVIDER_SITE_OTHER): Payer: 59 | Admitting: Cardiology

## 2017-01-15 VITALS — BP 164/89 | HR 76 | Ht 61.0 in | Wt 172.4 lb

## 2017-01-15 DIAGNOSIS — E119 Type 2 diabetes mellitus without complications: Secondary | ICD-10-CM

## 2017-01-15 DIAGNOSIS — E782 Mixed hyperlipidemia: Secondary | ICD-10-CM

## 2017-01-15 DIAGNOSIS — R0602 Shortness of breath: Secondary | ICD-10-CM

## 2017-01-15 DIAGNOSIS — I10 Essential (primary) hypertension: Secondary | ICD-10-CM

## 2017-01-15 MED ORDER — LISINOPRIL 5 MG PO TABS: 5.0000 mg | ORAL_TABLET | Freq: Every day | ORAL | 1 refills | Status: DC

## 2017-01-15 MED ORDER — PRAVASTATIN SODIUM 20 MG PO TABS
20.0000 mg | ORAL_TABLET | Freq: Every evening | ORAL | 1 refills | Status: DC
Start: 2017-01-15 — End: 2017-07-16

## 2017-01-15 NOTE — Patient Instructions (Signed)
Your physician recommends that you schedule a follow-up appointment in: [place] has recommended you make the following change in your medication:   START LISINOPRIL 5 MG DAILY  START PRAVASTATIN 20 MG DAILY  Your physician has requested that you have an echocardiogram. Echocardiography is a painless test that uses sound waves to create images of your heart. It provides your doctor with information about the size and shape of your heart and how well your heart's chambers and valves are working. This procedure takes approximately one hour. There are no restrictions for this procedure.  Your physician recommends that you return for lab work in: [place]  Thank you for choosing [facility]!!

## 2017-01-15 NOTE — Telephone Encounter (Signed)
Echo scheduled in [place] February 15, 2017

## 2017-01-15 NOTE — Progress Notes (Signed)
Clinical Summary [name] [name] is a 56 y.o.female seen as new patient, seen by Dr Dannielle Burn in 2010. She is self referred for SOB.    1. SOB - long history, evaluted by Lutricia Feil several years ago - 2005 normal coronaries. 2009 normal stress echo. 2014 stress echo negative.  - chronic ST/T changes on EKG  - recent SOB x 1 month. Mainly occurs with exertion. Example walking with flowers today just a few feet, SOB and chest pain - tightness in midchest, 7/10 in severity. Lasted few minutes. Has had prior episodes - no coughing, no wheezing - reports 7 lbs weight gain.   CAD risk factors: HTN, DM2,   2. Hyperlipidemia - LDL 181 - she stopped lipitor due to leg pains in the past   3. HTN - followed at [facility].  - does not check bp regularly  4. DM2 - followed by pcp, on oral agents - on ASA. Not on statin or ACE-I  SH: Husband is patient of mine, Medical laboratory scientific officer.  Past Medical History:  Diagnosis Date  . Abnormal EKG   . Depression   . Dyspnea   . Hypertension    Unspecified  . Sleep apnea    no CPAP worn  . Stroke [facility])      Allergies  Allergen Reactions  . Doxycycline Rash  . Erythromycin Rash     Current Outpatient Prescriptions  Medication Sig Dispense Refill  . ALPRAZolam (XANAX) 0.5 MG tablet Take 0.5 mg by mouth 3 (three) times daily as needed for anxiety.   5  . aspirin 81 MG tablet Take 81 mg by mouth daily.    . bisoprolol-hydrochlorothiazide (ZIAC) 10-6.25 MG per tablet Take 1 tablet by mouth every morning.   12  . buPROPion (WELLBUTRIN SR) 150 MG 12 hr tablet Take 150 mg by mouth daily.    . Cholecalciferol (VITAMIN D PO) Take 5,000 Units by mouth.    Marland Kitchen FLUoxetine (PROZAC) 20 MG capsule Take 60 mg by mouth every morning.   11  . omeprazole (PRILOSEC) 20 MG capsule Take 20 mg by mouth at bedtime.   9  . verapamil (CALAN-SR) 120 MG CR tablet Take 120 mg by mouth at bedtime.   11   No current facility-administered medications for this visit.       Past Surgical History:  Procedure Laterality Date  . HYSTEROSCOPY  2001   submucous myomectomy  . TOTAL ABDOMINAL HYSTERECTOMY  2002   TAH BSO  pathology showed leiomyoma, adenomyosis, simple and complex hyperplasia without atypia  . TUMOR REMOVAL     from ear     Allergies  Allergen Reactions  . Doxycycline Rash  . Erythromycin Rash      Family History  Problem Relation Age of Onset  . Cancer Father     Prostate  . Coronary artery disease Paternal Grandfather   . Uterine cancer Mother   . Irritable bowel syndrome Mother   . Stroke Mother   . Heart disease Maternal Grandmother   . Heart attack Brother   . Colitis Neg Hx   . Esophageal cancer Neg Hx   . Stomach cancer Neg Hx   . Rectal cancer Neg Hx      Social History [name] [name] reports that she has never smoked. She has never used smokeless tobacco. [name] [name] reports that she does not drink alcohol.   Review of Systems CONSTITUTIONAL: No weight loss, fever, chills, weakness or fatigue.  HEENT: Eyes: No  visual loss, blurred vision, double vision or yellow sclerae.No hearing loss, sneezing, congestion, runny nose or sore throat.  SKIN: No rash or itching.  CARDIOVASCULAR: per HPI RESPIRATORY: per HPI GASTROINTESTINAL: No anorexia, nausea, vomiting or diarrhea. No abdominal pain or blood.  GENITOURINARY: No burning on urination, no polyuria NEUROLOGICAL: No headache, dizziness, syncope, paralysis, ataxia, numbness or tingling in the extremities. No change in bowel or bladder control.  MUSCULOSKELETAL: No muscle, back pain, joint pain or stiffness.  LYMPHATICS: No enlarged nodes. No history of splenectomy.  PSYCHIATRIC: No history of depression or anxiety.  ENDOCRINOLOGIC: No reports of sweating, cold or heat intolerance. No polyuria or polydipsia.  Marland Kitchen   Physical Examination Vitals:   01/15/17 1340 01/15/17 1344  BP: (!) 171/83 (!) 164/89  Pulse: 74 76   Vitals:   01/15/17 1340  Weight: 172 lb 6.4  oz (78.2 kg)  Height: 5\' 1"  (1.549 m)    Gen: resting comfortably, no acute distress HEENT: no scleral icterus, pupils equal round and reactive, no palptable cervical adenopathy,  CV: RRR, no m/r/g, no jvd Resp: Clear to auscultation bilaterally GI: abdomen is soft, non-tender, non-distended, normal bowel sounds, no hepatosplenomegaly MSK: extremities are warm, no edema.  Skin: warm, no rash Neuro:  no focal deficits Psych: appropriate affect     Assessment and Plan  1. SOB - unclear etiology. CAD risk factors included DM2, and HTN. EKG in clinic shows SR lateral T-wave inversions.  - we will obtain echo, pending results likely obtain exercise nuclear stress.   2. HTN - elevated in clinic - in setting of her DM2 and HTN, will start lisinopril 5mg  daily.  - can consider stopping verapamil in the future to simplify her regimen as ACE-I is titrated.   3. Hyperlipidemia - elevated LDL. In setting of HL and DM2 will try alternative statin, she had side effects on lipitor in the past.She has mild LFT elevation at baseline AST 60, ALT 50. - start pravastatin 20mg  daily, follow liver function with CMET in 2 weeks  4. DM2 - glycemic control per pcp - from cardiac standpoint she is on ASA. We will start statin and ACE-I.    F/u pending testing results. After echo, likely obtain exercise nuclear stress.  Arnoldo Lenis, M.D.

## 2017-02-06 ENCOUNTER — Ambulatory Visit (INDEPENDENT_AMBULATORY_CARE_PROVIDER_SITE_OTHER): Payer: 59 | Admitting: Gynecology

## 2017-02-06 ENCOUNTER — Encounter: Payer: Self-pay | Admitting: Gynecology

## 2017-02-06 ENCOUNTER — Telehealth: Payer: Self-pay | Admitting: *Deleted

## 2017-02-06 ENCOUNTER — Other Ambulatory Visit (HOSPITAL_COMMUNITY): Payer: Self-pay | Admitting: Psychiatry

## 2017-02-06 VITALS — BP 130/86 | Ht 61.0 in | Wt 169.0 lb

## 2017-02-06 DIAGNOSIS — N644 Mastodynia: Secondary | ICD-10-CM | POA: Diagnosis not present

## 2017-02-06 DIAGNOSIS — N952 Postmenopausal atrophic vaginitis: Secondary | ICD-10-CM

## 2017-02-06 DIAGNOSIS — Z01411 Encounter for gynecological examination (general) (routine) with abnormal findings: Secondary | ICD-10-CM

## 2017-02-06 NOTE — Progress Notes (Signed)
    [name] [name] April 27, 1961 [id number]        56 y.o.  G0P0000 for annual exam.  Notes over the last several weeks right lateral breast discomfort. No palpable abnormalities on self-exam or any nipple discharge.  Most recent mammogram 07/2016. She did have follow up views of the right breast due to possible mass on screening mammography. A small cyst was noted.  Past medical history,surgical history, problem list, medications, allergies, family history and social history were all reviewed and documented as reviewed in the EPIC chart.  ROS:  Performed with pertinent positives and negatives included in the history, assessment and plan.   Additional significant findings :  None   Exam: Caryn Bee assistant Vitals:   02/06/17 1106  BP: 130/86  Weight: 169 lb (76.7 kg)  Height: 5\' 1"  (1.549 m)   Body mass index is 31.93 kg/m.  General appearance:  Normal affect, orientation and appearance. Skin: Grossly normal HEENT: Without gross lesions.  No cervical or supraclavicular adenopathy. Thyroid normal.  Lungs:  Clear without wheezing, rales or rhonchi Cardiac: RR, without RMG Abdominal:  Soft, nontender, without masses, guarding, rebound, organomegaly or hernia Breasts:  Examined lying and sitting without masses, retractions, discharge or axillary adenopathy. Pelvic:  Ext, BUS, Vagina: With atrophic changes  Adnexa: Without masses or tenderness    Anus and perineum: Normal   Rectovaginal: Normal sphincter tone without palpated masses or tenderness.    Assessment/Plan:  56 y.o. G0P0000 female for annual exam.   1. Right breast mastalgia over the last several weeks. Normal exam. Small cyst noted 6 months ago on mammogram/ultrasound. Recommended follow up study now with diagnostic mammogram and ultrasound of the right breast and patient will follow up with the breast center for this. She knows to call us if she does not hear from them within the next 1-2 weeks to schedule. 2. History of  TAH/BSO 2002 for menorrhagia. Final pathology showed leiomyoma, adenomyosis and simple/complex endometrial hyperplasia without atypia. Had been on HRT but discontinued 2004. Doing well from a symptomatic standpoint now. Was having more significant hot flashes/night sweats last year but these seem to be getting better. 3. Pap smear 2017. No Pap smear done today. No history of abnormal Pap smears previously. Options to stop screening altogether based on hysterectomy history discussed per current screening guidelines. 4. Colonoscopy 2016. Repeat at their recommended interval. 5. DEXA 2015 normal. Recommend repeat DEXA at age 80. 71. Health maintenance. Blood pressure 130/86. Does have a history of elevated blood pressure as well as diabetes being followed through her primary physician's office. She will continue to follow up with them in reference to this. No routine lab work done as this is done at their office. Follow up for her diagnostic breast studies otherwise follow up in one year for annual exam.   Anastasio Auerbach MD, 11:36 AM 02/06/2017

## 2017-02-06 NOTE — Patient Instructions (Signed)
Breast center will call to schedule mammogram and ultrasound

## 2017-02-06 NOTE — Telephone Encounter (Signed)
Appointment on 02/09/17 @ 8:40am pt informed.

## 2017-02-06 NOTE — Telephone Encounter (Signed)
-----   Message from Anastasio Auerbach, MD sent at 02/06/2017 11:35 AM EDT ----- Schedule right breast diagnostic mammography and ultrasound at the breast center reference several week history of right tail of Spence pain.

## 2017-02-08 ENCOUNTER — Other Ambulatory Visit: Payer: 59

## 2017-02-08 ENCOUNTER — Ambulatory Visit
Admission: RE | Admit: 2017-02-08 | Discharge: 2017-02-08 | Disposition: A | Discharge status: Home / Self Care | Payer: 59 | Source: Ambulatory Visit | Attending: Gynecology | Admitting: Gynecology

## 2017-02-08 DIAGNOSIS — N644 Mastodynia: Secondary | ICD-10-CM

## 2017-02-09 ENCOUNTER — Other Ambulatory Visit: Payer: 59

## 2017-02-14 ENCOUNTER — Other Ambulatory Visit: Payer: 59

## 2017-02-14 ENCOUNTER — Ambulatory Visit: Payer: 59 | Admitting: Gastroenterology

## 2017-02-15 ENCOUNTER — Ambulatory Visit (INDEPENDENT_AMBULATORY_CARE_PROVIDER_SITE_OTHER): Payer: 59

## 2017-02-15 ENCOUNTER — Other Ambulatory Visit: Payer: Self-pay

## 2017-02-15 DIAGNOSIS — R0602 Shortness of breath: Secondary | ICD-10-CM | POA: Diagnosis not present

## 2017-02-15 LAB — ECHOCARDIOGRAM COMPLETE
Ao-asc: 27 cm
E decel time: 272 msec
E/e' ratio: 12.66
FS: 42 % (ref 28–44)
IVS/LV PW RATIO, ED: 1.17
LA ID, A-P, ES: 43 mm
LA diam end sys: 43 mm
LA diam index: 2.33 cm/m2
LA vol A4C: 42.1 ml
LA vol index: 22.5 mL/m2
LA vol: 41.6 mL
LV E/e' medial: 12.66
LV E/e'average: 12.66
LV PW d: 6.9 mm — AB (ref 0.6–1.1)
LVOT SV: 69 mL
LVOT VTI: 27.2 cm
LVOT area: 2.54 cm2
LVOT diameter: 18 mm
LVOT peak grad rest: 7 mmHg
LVOT peak vel: 132 cm/s
Lateral S' vel: 15.2 cm/s
MV Dec: 272
MV Peak grad: 3 mmHg
MV pk A vel: 95.5 m/s
MV pk E vel: 80.8 m/s
RV sys press: 20 mmHg
Reg peak vel: 209 cm/s
TAPSE: 32.1 mm
TDI e' medial: 6.38
TR max vel: 209 cm/s

## 2017-02-19 ENCOUNTER — Telehealth: Payer: Self-pay | Admitting: Cardiology

## 2017-02-19 ENCOUNTER — Encounter: Payer: Self-pay | Admitting: *Deleted

## 2017-02-19 ENCOUNTER — Telehealth: Payer: Self-pay | Admitting: *Deleted

## 2017-02-19 DIAGNOSIS — R0602 Shortness of breath: Secondary | ICD-10-CM

## 2017-02-19 NOTE — Telephone Encounter (Signed)
Pt aware and rescheduled for next available 7/6

## 2017-02-19 NOTE — Telephone Encounter (Signed)
Yes reschedule until after stress   Zandra Abts MD

## 2017-02-19 NOTE — Telephone Encounter (Signed)
-----   Message from Arnoldo Lenis, MD sent at 02/19/2017 10:33 AM EDT ----- Echo looks good, heart function is normal. Please order an exercise nuclear stress test for SOB to look for blockages as the possible cause of her symptoms, hold ziac and verapamil day of test.   Zandra Abts MD

## 2017-02-19 NOTE — Telephone Encounter (Signed)
Pt agreeable to stress test - will place orders and forward to schedulers - pt has appt tomorrow with Dr Harl Bowie - will message if pt should reschedule appt until after stress test

## 2017-02-19 NOTE — Telephone Encounter (Signed)
exercise nuclear stress test for this pt for SOB Schedule February 23, 2017 at [facility]

## 2017-02-20 ENCOUNTER — Ambulatory Visit: Payer: 59 | Admitting: Cardiology

## 2017-02-23 ENCOUNTER — Encounter (HOSPITAL_COMMUNITY): Payer: Self-pay

## 2017-02-23 ENCOUNTER — Encounter (HOSPITAL_BASED_OUTPATIENT_CLINIC_OR_DEPARTMENT_OTHER)
Admission: RE | Admit: 2017-02-23 | Discharge: 2017-02-23 | Disposition: A | Discharge status: Home / Self Care | Payer: 59 | Source: Ambulatory Visit | Attending: Cardiology | Admitting: Cardiology

## 2017-02-23 ENCOUNTER — Encounter (HOSPITAL_COMMUNITY)
Admission: RE | Admit: 2017-02-23 | Discharge: 2017-02-23 | Disposition: A | Discharge status: Home / Self Care | Payer: 59 | Source: Ambulatory Visit | Attending: Cardiology | Admitting: Cardiology

## 2017-02-23 DIAGNOSIS — R0602 Shortness of breath: Secondary | ICD-10-CM

## 2017-02-23 LAB — NM MYOCAR MULTI W/SPECT W/WALL MOTION / EF
Estimated workload: 6.8 METS
Exercise duration (min): 3 min
Exercise duration (sec): 57 s
LV dias vol: 35 mL (ref 46–106)
LV sys vol: 5 mL
MPHR: 163 {beats}/min
Peak HR: 129 {beats}/min
Percent HR: 79 %
RATE: 0.35
RPE: 16
Rest HR: 74 {beats}/min
SDS: 3
SRS: 2
SSS: 5
TID: 0.93

## 2017-02-23 MED ORDER — REGADENOSON 0.4 MG/5ML IV SOLN
INTRAVENOUS | Status: AC
  Administered 2017-02-23: 0.4 mg via INTRAVENOUS
  Filled 2017-02-23: qty 5

## 2017-02-23 MED ORDER — SODIUM CHLORIDE 0.9% FLUSH
INTRAVENOUS | Status: AC
  Administered 2017-02-23: 10 mL via INTRAVENOUS
  Filled 2017-02-23: qty 10

## 2017-02-23 MED ORDER — TECHNETIUM TC 99M TETROFOSMIN IV KIT
10.0000 | PACK | Freq: Once | INTRAVENOUS | Status: AC | PRN
  Administered 2017-02-23: 10 via INTRAVENOUS

## 2017-02-23 MED ORDER — TECHNETIUM TC 99M TETROFOSMIN IV KIT
30.0000 | PACK | Freq: Once | INTRAVENOUS | Status: AC | PRN
  Administered 2017-02-23: 30 via INTRAVENOUS

## 2017-02-26 ENCOUNTER — Telehealth: Payer: Self-pay

## 2017-02-26 NOTE — Telephone Encounter (Signed)
-----   Message from Arnoldo Lenis, MD sent at 02/26/2017 12:59 PM EDT ----- Stress test looks good, no evidence of blocakges. She should f/u in 1 month to discuss all her tests in detail and reassess her symptmos  Zandra Abts MD

## 2017-02-26 NOTE — Telephone Encounter (Signed)
Patient notified. Routed to PCP 

## 2017-03-16 ENCOUNTER — Ambulatory Visit (INDEPENDENT_AMBULATORY_CARE_PROVIDER_SITE_OTHER): Payer: 59 | Admitting: Cardiology

## 2017-03-16 ENCOUNTER — Encounter: Payer: Self-pay | Admitting: Cardiology

## 2017-03-16 VITALS — BP 122/78 | HR 71 | Ht 61.0 in | Wt 168.0 lb

## 2017-03-16 DIAGNOSIS — R0602 Shortness of breath: Secondary | ICD-10-CM | POA: Diagnosis not present

## 2017-03-16 DIAGNOSIS — E782 Mixed hyperlipidemia: Secondary | ICD-10-CM | POA: Diagnosis not present

## 2017-03-16 DIAGNOSIS — I10 Essential (primary) hypertension: Secondary | ICD-10-CM | POA: Diagnosis not present

## 2017-03-16 NOTE — Patient Instructions (Signed)

## 2017-03-16 NOTE — Progress Notes (Signed)
Clinical Summary [name] [name] is a 56 y.o.female seen today for follow up of the following medical problems.  1. SOB - long history, evaluted by Lutricia Feil several years ago - 2005 normal coronaries. 2009 normal stress echo. 2014 stress echo negative.  - chronic ST/T changes on EKG  - last visit reported symptoms of SOB and chest tightness.     02/2017 echo LVEF 65-70%, no WMAs, grade I diastolic dysfunction 03/9389 nuclear stress test no ischemia, exercised 4 minutes -  Symptoms improved since last visit.   2. Hyperlipidemia - LDL 181 - she stopped lipitor due to leg pains in the past  - last visit we started pravatstin 20mg  daily. Tolerating well without troubles.   3. HTN - followed at [facility].  - does not check bp regularly  - last visit we started lisinopril 5mg  daily. Stable renal function and potassium.      SH: Husband is patient of mine, Medical laboratory scientific officer.    Past Medical History:  Diagnosis Date  . Abnormal EKG   . Anxiety    PTSD  . Depression   . Diabetes mellitus without complication ([place])   . Dyspnea   . Hypertension    Unspecified  . Sleep apnea    no CPAP worn  . Stroke [facility])      Allergies  Allergen Reactions  . Doxycycline Rash  . Erythromycin Rash     Current Outpatient Prescriptions  Medication Sig Dispense Refill  . ALPRAZolam (XANAX) 0.5 MG tablet Take 0.5 mg by mouth 3 (three) times daily as needed for anxiety.   5  . aspirin 81 MG tablet Take 81 mg by mouth daily.    . bisoprolol-hydrochlorothiazide (ZIAC) 10-6.25 MG per tablet Take 1 tablet by mouth every morning.   12  . buPROPion (WELLBUTRIN SR) 150 MG 12 hr tablet Take 150 mg by mouth daily.    Marland Kitchen FLUoxetine (PROZAC) 20 MG capsule Take 60 mg by mouth every morning.   11  . glimepiride (AMARYL) 2 MG tablet Take 1 mg by mouth at bedtime.    Marland Kitchen lisinopril (PRINIVIL,ZESTRIL) 5 MG tablet Take 1 tablet (5 mg total) by mouth daily. 90 tablet 1  . metFORMIN (GLUCOPHAGE) 500 MG  tablet Take 500 mg by mouth daily with breakfast.    . omeprazole (PRILOSEC) 20 MG capsule Take 20 mg by mouth at bedtime.   9  . pravastatin (PRAVACHOL) 20 MG tablet Take 1 tablet (20 mg total) by mouth every evening. 90 tablet 1  . verapamil (CALAN-SR) 120 MG CR tablet Take 120 mg by mouth at bedtime.   11   No current facility-administered medications for this visit.      Past Surgical History:  Procedure Laterality Date  . HYSTEROSCOPY  2001   submucous myomectomy  . TOTAL ABDOMINAL HYSTERECTOMY  2002   TAH BSO  pathology showed leiomyoma, adenomyosis, simple and complex hyperplasia without atypia  . TUMOR REMOVAL     from ear     Allergies  Allergen Reactions  . Doxycycline Rash  . Erythromycin Rash      Family History  Problem Relation Age of Onset  . Cancer Father        Prostate  . Coronary artery disease Paternal Grandfather   . Uterine cancer Mother   . Irritable bowel syndrome Mother   . Stroke Mother   . Heart disease Maternal Grandmother   . Heart attack Brother   . Cancer Brother  Prostate  . Colitis Neg Hx   . Esophageal cancer Neg Hx   . Stomach cancer Neg Hx   . Rectal cancer Neg Hx      Social History Ms. Goodreau reports that she has never smoked. She has never used smokeless tobacco. Ms. Holster reports that she does not drink alcohol.   Review of Systems CONSTITUTIONAL: No weight loss, fever, chills, weakness or fatigue.  HEENT: Eyes: No visual loss, blurred vision, double vision or yellow sclerae.No hearing loss, sneezing, congestion, runny nose or sore throat.  SKIN: No rash or itching.  CARDIOVASCULAR: per hpi RESPIRATORY: No shortness of breath, cough or sputum.  GASTROINTESTINAL: No anorexia, nausea, vomiting or diarrhea. No abdominal pain or blood.  GENITOURINARY: No burning on urination, no polyuria NEUROLOGICAL: No headache, dizziness, syncope, paralysis, ataxia, numbness or tingling in the extremities. No change in bowel or  bladder control.  MUSCULOSKELETAL: No muscle, back pain, joint pain or stiffness.  LYMPHATICS: No enlarged nodes. No history of splenectomy.  PSYCHIATRIC: No history of depression or anxiety.  ENDOCRINOLOGIC: No reports of sweating, cold or heat intolerance. No polyuria or polydipsia.  Marland Kitchen   Physical Examination Vitals:   03/16/17 1109  BP: 122/78  Pulse: 71   Vitals:   03/16/17 1109  Weight: 168 lb (76.2 kg)  Height: 5\' 1"  (1.549 m)    Gen: resting comfortably, no acute distress HEENT: no scleral icterus, pupils equal round and reactive, no palptable cervical adenopathy,  CV: RRR, no mrg, no jvd Resp: Clear to auscultation bilaterally GI: abdomen is soft, non-tender, non-distended, normal bowel sounds, no hepatosplenomegaly MSK: extremities are warm, no edema.  Skin: warm, no rash Neuro:  no focal deficits Psych: appropriate affect   Diagnostic Studies  02/2017 echo Study Conclusions  - Left ventricle: The cavity size was normal. Wall thickness was   increased in a pattern of mild LVH. Systolic function was   vigorous. The estimated ejection fraction was in the range of 65%   to 70%. Wall motion was normal; there were no regional wall   motion abnormalities. Doppler parameters are consistent with   abnormal left ventricular relaxation (grade 1 diastolic   dysfunction). - Aortic valve: Valve area (VTI): 2.32 cm^2. Valve area (Vmax):   2.19 cm^2. Valve area (Vmean): 2.18 cm^2. - Technically adequate study.    02/2017 echo  Blood pressure demonstrated a hypertensive response to exercise.  There was no ST segment deviation noted during stress.  The study is normal.  This is a low risk study.  Nuclear stress EF: 85%.  Assessment and Plan   1. SOB - negative cardiac workup, symptoms have improved - continue to monitor.   2. HTN - at goal, continue current meds  3. Hyperlipidemia - elevated LDL. In setting of HL and DM2 will try alternative statin, she  had side effects on lipitor in the past.She has mild LFT elevation at baseline AST 60, ALT 50. - tolerating pravstatin, we will continue  F/u 6 months      Arnoldo Lenis, M.D

## 2017-06-06 ENCOUNTER — Other Ambulatory Visit: Payer: Self-pay | Admitting: Gynecology

## 2017-06-06 DIAGNOSIS — Z1231 Encounter for screening mammogram for malignant neoplasm of breast: Secondary | ICD-10-CM

## 2017-07-16 ENCOUNTER — Other Ambulatory Visit: Payer: Self-pay | Admitting: Cardiology

## 2017-07-19 ENCOUNTER — Ambulatory Visit: Payer: 59

## 2017-07-21 ENCOUNTER — Other Ambulatory Visit: Payer: Self-pay | Admitting: Cardiology

## 2017-08-15 ENCOUNTER — Ambulatory Visit: Payer: 59

## 2017-10-20 ENCOUNTER — Other Ambulatory Visit: Payer: Self-pay | Admitting: Cardiology

## 2017-12-04 ENCOUNTER — Encounter: Payer: Self-pay | Admitting: Gastroenterology

## 2017-12-28 ENCOUNTER — Other Ambulatory Visit: Payer: Self-pay

## 2017-12-28 ENCOUNTER — Emergency Department (HOSPITAL_COMMUNITY)
Admission: EM | Admit: 2017-12-28 | Discharge: 2017-12-28 | Disposition: A | Discharge status: Home / Self Care | Payer: 59 | Attending: Emergency Medicine | Admitting: Emergency Medicine

## 2017-12-28 ENCOUNTER — Emergency Department (HOSPITAL_COMMUNITY): Payer: 59

## 2017-12-28 DIAGNOSIS — R1032 Left lower quadrant pain: Secondary | ICD-10-CM | POA: Diagnosis present

## 2017-12-28 DIAGNOSIS — I10 Essential (primary) hypertension: Secondary | ICD-10-CM | POA: Diagnosis not present

## 2017-12-28 DIAGNOSIS — N200 Calculus of kidney: Secondary | ICD-10-CM | POA: Diagnosis not present

## 2017-12-28 DIAGNOSIS — Z7982 Long term (current) use of aspirin: Secondary | ICD-10-CM | POA: Diagnosis not present

## 2017-12-28 DIAGNOSIS — E119 Type 2 diabetes mellitus without complications: Secondary | ICD-10-CM | POA: Insufficient documentation

## 2017-12-28 DIAGNOSIS — Z79899 Other long term (current) drug therapy: Secondary | ICD-10-CM | POA: Insufficient documentation

## 2017-12-28 LAB — COMPREHENSIVE METABOLIC PANEL
ALT: 86 U/L — ABNORMAL HIGH (ref 14–54)
AST: 98 U/L — ABNORMAL HIGH (ref 15–41)
Albumin: 4.1 g/dL (ref 3.5–5.0)
Alkaline Phosphatase: 177 U/L — ABNORMAL HIGH (ref 38–126)
Anion gap: 14 (ref 5–15)
BUN: 21 mg/dL — ABNORMAL HIGH (ref 6–20)
CO2: 21 mmol/L — ABNORMAL LOW (ref 22–32)
Calcium: 9.7 mg/dL (ref 8.9–10.3)
Chloride: 102 mmol/L (ref 101–111)
Creatinine, Ser: 0.83 mg/dL (ref 0.44–1.00)
GFR calc Af Amer: >60 mL/min (ref >60)
GFR calc non Af Amer: >60 mL/min (ref >60)
Glucose, Bld: 239 mg/dL — ABNORMAL HIGH (ref 65–99)
Potassium: 3.7 mmol/L (ref 3.5–5.1)
Sodium: 137 mmol/L (ref 135–145)
Total Bilirubin: 0.5 mg/dL (ref 0.3–1.2)
Total Protein: 7.1 g/dL (ref 6.5–8.1)

## 2017-12-28 LAB — CBC
HCT: 41.7 % (ref 36.0–46.0)
Hemoglobin: 13.6 g/dL (ref 12.0–15.0)
MCH: 28.5 pg (ref 26.0–34.0)
MCHC: 32.6 g/dL (ref 30.0–36.0)
MCV: 87.4 fL (ref 78.0–100.0)
Platelets: 257 10*3/uL (ref 150–400)
RBC: 4.77 MIL/uL (ref 3.87–5.11)
RDW: 13.7 % (ref 11.5–15.5)
WBC: 9.7 10*3/uL (ref 4.0–10.5)

## 2017-12-28 LAB — URINALYSIS, ROUTINE W REFLEX MICROSCOPIC
Bilirubin Urine: NEGATIVE
Glucose, UA: NEGATIVE mg/dL
Ketones, ur: NEGATIVE mg/dL
Leukocytes, UA: NEGATIVE
Nitrite: NEGATIVE
Protein, ur: 100 mg/dL — AB
Specific Gravity, Urine: 1.009 (ref 1.005–1.030)
pH: 6 (ref 5.0–8.0)

## 2017-12-28 LAB — LIPASE, BLOOD: Lipase: 35 U/L (ref 11–51)

## 2017-12-28 LAB — I-STAT BETA HCG BLOOD, ED (MC, WL, AP ONLY): I-stat hCG, quantitative: 9 m[IU]/mL — ABNORMAL HIGH (ref <5)

## 2017-12-28 MED ORDER — ONDANSETRON HCL 4 MG/2ML IJ SOLN
4.0000 mg | Freq: Once | INTRAMUSCULAR | Status: AC
  Administered 2017-12-28: 4 mg via INTRAVENOUS
  Filled 2017-12-28: qty 2

## 2017-12-28 MED ORDER — HYDROCODONE-ACETAMINOPHEN 5-325 MG PO TABS: 2.0000 | ORAL_TABLET | Freq: Four times a day (QID) | ORAL | 0 refills | Status: DC | PRN

## 2017-12-28 MED ORDER — OXYCODONE-ACETAMINOPHEN 5-325 MG PO TABS
1.0000 | ORAL_TABLET | Freq: Once | ORAL | Status: AC
  Administered 2017-12-28: 1 via ORAL
  Filled 2017-12-28: qty 1

## 2017-12-28 MED ORDER — ONDANSETRON 4 MG PO TBDP: 4.0000 mg | ORAL_TABLET | Freq: Three times a day (TID) | ORAL | 0 refills | Status: DC | PRN

## 2017-12-28 MED ORDER — HYDROMORPHONE HCL 2 MG/ML IJ SOLN
1.0000 mg | Freq: Once | INTRAMUSCULAR | Status: AC
  Administered 2017-12-28: 1 mg via INTRAVENOUS
  Filled 2017-12-28: qty 1

## 2017-12-28 MED ORDER — KETOROLAC TROMETHAMINE 30 MG/ML IJ SOLN
30.0000 mg | Freq: Once | INTRAMUSCULAR | Status: AC
  Administered 2017-12-28: 30 mg via INTRAVENOUS
  Filled 2017-12-28: qty 1

## 2017-12-28 MED ORDER — CEPHALEXIN 500 MG PO CAPS: 500.0000 mg | ORAL_CAPSULE | Freq: Two times a day (BID) | ORAL | 0 refills | Status: DC

## 2017-12-28 MED ORDER — HYDROCODONE-ACETAMINOPHEN 5-325 MG PO TABS
2.0000 | ORAL_TABLET | Freq: Once | ORAL | Status: AC
  Administered 2017-12-28: 2 via ORAL
  Filled 2017-12-28: qty 2

## 2017-12-28 NOTE — ED Provider Notes (Signed)
Patient placed in Quick Look pathway, seen and evaluated   Chief Complaint: L flank pain  HPI:   Patient presents to ED for evaluation of acute onset left-sided flank pain now radiating to her left lower quadrant.  She also noticed one episode of gross hematuria prior to arrival.  She has had a history of kidney stones and states this feels similar.  She has not taken any medications prior to arrival to help with symptoms.  Denies any dysuria, fevers, vomiting.  ROS: flank pain  Physical Exam:   Gen: Appears uncomfortable  Neuro: Awake and Alert  Skin: Warm    Focused Exam: Left lower quadrant and left flank tenderness to palpation with no rebound or guarding present.    Initiation of care has begun. The patient has been counseled on the process, plan, and necessity for staying for the completion/evaluation, and the remainder of the medical screening examination  Portions of this note were generated with Dragon dictation software. Dictation errors may occur despite best attempts at proofreading.    Delia Heady, PA-C 12/28/17 1653    Orlie Dakin, MD 12/29/17 [phone]

## 2017-12-28 NOTE — ED Provider Notes (Signed)
[facility] EMERGENCY DEPARTMENT Provider Note   CSN: [id number] Arrival date & time: 12/28/17  1511     History   Chief Complaint Chief Complaint  Patient presents with  . Flank Pain  . Abdominal Pain    HPI [name] [name] is a 57 y.o. female.  Patient presents to the emergency department with a chief complaint of sudden onset left flank, back, and lower abdominal pain that started today at around 2 PM.  She states that it feels similar to prior kidney stones.  She rates her pain is 10 out of 10.  She reports associated nausea and vomiting.  She denies any fevers chills.  Denies any dysuria.  She has not taken anything for symptoms.  She denies any aggravating factors.  The history is provided by the patient. No language interpreter was used.    Past Medical History:  Diagnosis Date  . Abnormal EKG   . Anxiety    PTSD  . Depression   . Diabetes mellitus without complication ([place])   . Dyspnea   . Hypertension    Unspecified  . Sleep apnea    no CPAP worn  . Stroke [facility])     Patient Active Problem List   Diagnosis Date Noted  . External hemorrhoids 12/08/2014  . Rectal bleeding 11/22/2014  . Hemorrhoids, internal 11/22/2014  . HYPERTENSION, UNSPECIFIED 06/08/2009  . DYSPNEA 06/08/2009  . ABNORMAL EKG 06/08/2009    Past Surgical History:  Procedure Laterality Date  . HYSTEROSCOPY  2001   submucous myomectomy  . TOTAL ABDOMINAL HYSTERECTOMY  2002   TAH BSO  pathology showed leiomyoma, adenomyosis, simple and complex hyperplasia without atypia  . TUMOR REMOVAL     from ear     OB History    Gravida  0   Para  0   Term  0   Preterm  0   AB  0   Living  0     SAB  0   TAB  0   Ectopic  0   Multiple  0   Live Births               Home Medications    Prior to Admission medications   Medication Sig Start Date End Date Taking? Authorizing Provider  ALPRAZolam Duanne Moron) 0.5 MG tablet Take 0.5 mg by mouth 3 (three) times  daily as needed for anxiety.  05/19/14  Yes [provider]  aspirin 81 MG tablet Take 81 mg by mouth at bedtime.    Yes [provider]  bisoprolol-hydrochlorothiazide (ZIAC) 10-6.25 MG per tablet Take 1 tablet by mouth every morning.  07/02/14  Yes [provider]  buPROPion (WELLBUTRIN SR) 150 MG 12 hr tablet Take 150 mg by mouth daily.   Yes [provider]  FLUoxetine (PROZAC) 20 MG capsule Take 60 mg by mouth every morning.  06/24/14  Yes [provider]  gabapentin (NEURONTIN) 100 MG capsule Take 100 mg by mouth 3 (three) times daily. 12/19/17  Yes [provider]  glimepiride (AMARYL) 2 MG tablet Take 2-4 mg by mouth See admin instructions. 4 mg in the morning 2 mg in the evening   Yes [provider]  lisinopril (PRINIVIL,ZESTRIL) 5 MG tablet TAKE 1 TABLET BY MOUTH EVERY DAY 10/22/17  Yes Branch, Alphonse Guild, MD  omeprazole (PRILOSEC) 20 MG capsule Take 20 mg by mouth at bedtime.  07/02/14  Yes [provider]  pravastatin (PRAVACHOL) 20 MG tablet  TAKE 1 TABLET BY MOUTH EVERY DAY IN THE EVENING 07/16/17  Yes Branch, Alphonse Guild, MD  verapamil (CALAN-SR) 120 MG CR tablet Take 120 mg by mouth at bedtime.  07/02/14  Yes [provider]    Family History Family History  Problem Relation Age of Onset  . Cancer Father        Prostate  . Coronary artery disease Paternal Grandfather   . Uterine cancer Mother   . Irritable bowel syndrome Mother   . Stroke Mother   . Heart disease Maternal Grandmother   . Heart attack Brother   . Cancer Brother        Prostate  . Colitis Neg Hx   . Esophageal cancer Neg Hx   . Stomach cancer Neg Hx   . Rectal cancer Neg Hx     Social History Social History   Tobacco Use  . Smoking status: Never Smoker  . Smokeless tobacco: Never Used  Substance Use Topics  . Alcohol use: No    Alcohol/week: 0.0 oz  . Drug use: No     Allergies   Doxycycline and  Erythromycin   Review of Systems Review of Systems  All other systems reviewed and are negative.    Physical Exam Updated Vital Signs BP 136/82   Pulse 64   Temp 98.8 F (37.1 C) (Oral)   Resp 18   SpO2 96%   Physical Exam  Constitutional: She is oriented to person, place, and time. She appears well-developed and well-nourished.  HENT:  Head: Normocephalic and atraumatic.  Eyes: Pupils are equal, round, and reactive to light. Conjunctivae and EOM are normal.  Neck: Normal range of motion. Neck supple.  Cardiovascular: Normal rate and regular rhythm. Exam reveals no gallop and no friction rub.  No murmur heard. Pulmonary/Chest: Effort normal and breath sounds normal. No respiratory distress. She has no wheezes. She has no rales. She exhibits no tenderness.  Abdominal: Soft. Bowel sounds are normal. She exhibits no distension and no mass. There is no tenderness. There is no rebound and no guarding.  Musculoskeletal: Normal range of motion. She exhibits no edema or tenderness.  Neurological: She is alert and oriented to person, place, and time.  Skin: Skin is warm and dry.  Psychiatric: She has a normal mood and affect. Her behavior is normal. Judgment and thought content normal.  Nursing note and vitals reviewed.    ED Treatments / Results  Labs (all labs ordered are listed, but only abnormal results are displayed) Labs Reviewed  COMPREHENSIVE METABOLIC PANEL - Abnormal; Notable for the following components:      Result Value   CO2 21 (*)    Glucose, Bld 239 (*)    BUN 21 (*)    AST 98 (*)    ALT 86 (*)    Alkaline Phosphatase 177 (*)    All other components within normal limits  URINALYSIS, ROUTINE W REFLEX MICROSCOPIC - Abnormal; Notable for the following components:   Color, Urine AMBER (*)    APPearance CLOUDY (*)    Hgb urine dipstick LARGE (*)    Protein, ur 100 (*)    Bacteria, UA RARE (*)    Squamous Epithelial / LPF 0-5 (*)    All other components within  normal limits  I-STAT BETA HCG BLOOD, ED ([facility], [facility], [facility] ONLY) - Abnormal; Notable for the following components:   I-stat hCG, quantitative 9.0 (*)    All other components within normal limits  LIPASE, BLOOD  CBC    EKG None  Radiology Ct Renal Stone Study  Result Date: 12/28/2017 CLINICAL DATA:  Left-sided flank pain beginning this morning. EXAM: CT ABDOMEN AND PELVIS WITHOUT CONTRAST TECHNIQUE: Multidetector CT imaging of the abdomen and pelvis was performed following the standard protocol without IV contrast. COMPARISON:  Previous CT 08/15/2017. FINDINGS: Lower chest: Normal size heart. Trace pericardial effusion or thickening. Clear lung bases. Hepatobiliary: Hepatic steatosis. Slight surface nodularity of the liver may reflect morphologic changes of cirrhosis. No biliary dilatation. Contracted gallbladder without stones. Pancreas: Normal Spleen: Normal Adrenals/Urinary Tract: Normal bilateral adrenal glands. Moderate left-sided hydronephrosis and hydroureter secondary to a calculus seen at the left pelvic brim measuring 4 mm. The urinary bladder is unremarkable. The right-sided renal collecting system is within normal limits. Stomach/Bowel: Stomach is within normal limits. Appendix appears normal. No evidence of bowel wall thickening, distention, or inflammatory changes. Scattered colonic diverticulosis. Vascular/Lymphatic: Mild aortoiliac atherosclerosis. No aneurysm. No lymphadenopathy. Small porta hepatic, right gastric and peripancreatic lymph nodes as before. Reproductive: Surgically absent uterus.  No adnexal mass. Other: Abdominopelvic ascites or free air. Musculoskeletal: Degenerative disc disease T11-12. No acute or significant osseous findings. IMPRESSION: 1. 4 mm left ureteral stone at the pelvic brim causing moderate left-sided hydronephrosis and hydroureter. 2. Colonic diverticulosis without acute diverticulitis. 3. Hepatic steatosis. Slightly lobular liver surface that may reflect  morphologic changes of cirrhosis. Electronically Signed   By: Ashley Royalty M.D.   On: 12/28/2017 18:17    Procedures Procedures (including critical care time)  Medications Ordered in ED Medications  oxyCODONE-acetaminophen (PERCOCET/ROXICET) 5-325 MG per tablet 1 tablet (1 tablet Oral Given 12/28/17 1710)  HYDROmorphone (DILAUDID) injection 1 mg (1 mg Intravenous Given 12/28/17 2224)  ondansetron (ZOFRAN) injection 4 mg (4 mg Intravenous Given 12/28/17 2224)  ketorolac (TORADOL) 30 MG/ML injection 30 mg (30 mg Intravenous Given 12/28/17 2224)     Initial Impression / Assessment and Plan / ED Course  I have reviewed the triage vital signs and the nursing notes.  Pertinent labs & imaging results that were available during my care of the patient were reviewed by me and considered in my medical decision making (see chart for details).     Patient with left flank pain.  Differential includes, but is not limited to kidney stone, diverticulitis, muscle strain.  CT scan ordered in triage shows 4 mm ureteral stone with hydronephrosis and hydroureter.  Pain is well controlled with Dilaudid and Toradol.  Will fluid challenge.  She does have some white cells in her urine, will cover with Keflex and send urine for culture.  Creatinine is normal.  Patient is established with urology, I will encourage her to follow-up with them if she does not pass stone in the next 72 hours.  Prior charts reviewed.  Hx of hysterectomy.    11:31 PM Pain well controlled.  Comfortable with discharge plan.      Final Clinical Impressions(s) / ED Diagnoses   Final diagnoses:  Kidney stone    ED Discharge Orders        Ordered    HYDROcodone-acetaminophen (NORCO/VICODIN) 5-325 MG tablet  Every 6 hours PRN     12/28/17 2330    cephALEXin (KEFLEX) 500 MG capsule  2 times daily     12/28/17 2330    ondansetron (ZOFRAN ODT) 4 MG disintegrating tablet  Every 8 hours PRN     12/28/17 2330       Montine Circle,  PA-C 12/28/17 2331    Winfred Leeds, Sam,  MD 12/29/17 [id number]

## 2017-12-28 NOTE — ED Notes (Signed)
Pt given ice pack per request. 

## 2017-12-28 NOTE — ED Triage Notes (Signed)
Patient here for abdominal pain, and flank pain.  Was out today and had a blood in her urin.  States unable to sit still due to the pain.  A&Ox4.

## 2018-01-10 ENCOUNTER — Other Ambulatory Visit: Payer: Self-pay | Admitting: Cardiology

## 2018-01-17 ENCOUNTER — Other Ambulatory Visit: Payer: Self-pay | Admitting: Cardiology

## 2018-02-13 ENCOUNTER — Encounter: Payer: Self-pay | Admitting: Gastroenterology

## 2018-02-18 ENCOUNTER — Other Ambulatory Visit: Payer: Self-pay | Admitting: Cardiology

## 2018-03-17 ENCOUNTER — Other Ambulatory Visit: Payer: Self-pay | Admitting: Cardiology

## 2018-03-23 ENCOUNTER — Other Ambulatory Visit: Payer: Self-pay | Admitting: Cardiology

## 2018-03-25 ENCOUNTER — Other Ambulatory Visit: Payer: Self-pay | Admitting: Cardiology

## 2018-03-25 MED ORDER — LISINOPRIL 5 MG PO TABS: 5.0000 mg | ORAL_TABLET | Freq: Every day | ORAL | 2 refills | Status: DC

## 2018-03-25 NOTE — Telephone Encounter (Signed)
°*  STAT* If patient is at the pharmacy, call can be transferred to refill team.   1. Which medications need to be refilled?lisinopril (PRINIVIL,ZESTRIL) 5 MG tablet     2. Which pharmacy/location (including street and city if local pharmacy) is medication to be sent to? [facility] [place]  3. Do they need a 30 day or 90 day supply?

## 2018-03-25 NOTE — Telephone Encounter (Signed)
Done

## 2018-04-02 ENCOUNTER — Ambulatory Visit: Payer: 59 | Admitting: Cardiology

## 2018-04-02 ENCOUNTER — Encounter: Payer: Self-pay | Admitting: Cardiology

## 2018-04-02 VITALS — BP 131/77 | HR 74 | Ht 61.0 in | Wt 167.0 lb

## 2018-04-02 DIAGNOSIS — I10 Essential (primary) hypertension: Secondary | ICD-10-CM

## 2018-04-02 DIAGNOSIS — E119 Type 2 diabetes mellitus without complications: Secondary | ICD-10-CM

## 2018-04-02 DIAGNOSIS — E782 Mixed hyperlipidemia: Secondary | ICD-10-CM | POA: Diagnosis not present

## 2018-04-02 DIAGNOSIS — R0602 Shortness of breath: Secondary | ICD-10-CM

## 2018-04-02 MED ORDER — LISINOPRIL 5 MG PO TABS: 5.0000 mg | ORAL_TABLET | Freq: Every day | ORAL | 3 refills | Status: DC

## 2018-04-02 NOTE — Progress Notes (Signed)
Clinical Summary [name] [name] is a 57 y.o.female seen today for follow up of the following medical problems.  1. SOB - long history, evaluted by Lutricia Feil several years ago - 2005 normal coronaries. 2009 normal stress echo. 2014 stress echo negative.  - chronic ST/T changes on EKG  - last visit reported symptoms of SOB and chest tightness.     02/2017 echo LVEF 65-70%, no WMAs, grade I diastolic dysfunction 05/3809 nuclear stress test no ischemia, exercised 4 minutes  - no recent SOB since last visit.    2. Hyperlipidemia - she stopped lipitor due to leg pains in the past - tolerating pravastatin. Has no recent labs.   3. HTN - compliant with meds     SH: Husband is patient of mine, Medical laboratory scientific officer.     Past Medical History:  Diagnosis Date  . Abnormal EKG   . Anxiety    PTSD  . Depression   . Diabetes mellitus without complication ([place])   . Dyspnea   . Hypertension    Unspecified  . Sleep apnea    no CPAP worn  . Stroke [facility])      Allergies  Allergen Reactions  . Doxycycline Rash  . Erythromycin Rash     Current Outpatient Medications  Medication Sig Dispense Refill  . ALPRAZolam (XANAX) 0.5 MG tablet Take 0.5 mg by mouth 3 (three) times daily as needed for anxiety.   5  . aspirin 81 MG tablet Take 81 mg by mouth at bedtime.     . bisoprolol-hydrochlorothiazide (ZIAC) 10-6.25 MG per tablet Take 1 tablet by mouth every morning.   12  . buPROPion (WELLBUTRIN SR) 150 MG 12 hr tablet Take 150 mg by mouth daily.    . cephALEXin (KEFLEX) 500 MG capsule Take 1 capsule (500 mg total) by mouth 2 (two) times daily. 14 capsule 0  . FLUoxetine (PROZAC) 20 MG capsule Take 60 mg by mouth every morning.   11  . gabapentin (NEURONTIN) 100 MG capsule Take 100 mg by mouth 3 (three) times daily.  4  . glimepiride (AMARYL) 2 MG tablet Take 2-4 mg by mouth See admin instructions. 4 mg in the morning 2 mg in the evening    . HYDROcodone-acetaminophen (NORCO/VICODIN)  5-325 MG tablet Take 2 tablets by mouth every 6 (six) hours as needed. 15 tablet 0  . lisinopril (PRINIVIL,ZESTRIL) 5 MG tablet Take 1 tablet (5 mg total) by mouth daily. 30 tablet 2  . omeprazole (PRILOSEC) 20 MG capsule Take 20 mg by mouth at bedtime.   9  . ondansetron (ZOFRAN ODT) 4 MG disintegrating tablet Take 1 tablet (4 mg total) by mouth every 8 (eight) hours as needed for nausea or vomiting. 10 tablet 0  . pravastatin (PRAVACHOL) 20 MG tablet TAKE 1 TABLET BY MOUTH EVERY DAY IN THE EVENING 90 tablet 0  . verapamil (CALAN-SR) 120 MG CR tablet Take 120 mg by mouth at bedtime.   11   No current facility-administered medications for this visit.      Past Surgical History:  Procedure Laterality Date  . HYSTEROSCOPY  2001   submucous myomectomy  . TOTAL ABDOMINAL HYSTERECTOMY  2002   TAH BSO  pathology showed leiomyoma, adenomyosis, simple and complex hyperplasia without atypia  . TUMOR REMOVAL     from ear     Allergies  Allergen Reactions  . Doxycycline Rash  . Erythromycin Rash      Family History  Problem Relation Age  of Onset  . Cancer Father        Prostate  . Coronary artery disease Paternal Grandfather   . Uterine cancer Mother   . Irritable bowel syndrome Mother   . Stroke Mother   . Heart disease Maternal Grandmother   . Heart attack Brother   . Cancer Brother        Prostate  . Colitis Neg Hx   . Esophageal cancer Neg Hx   . Stomach cancer Neg Hx   . Rectal cancer Neg Hx      Social History [name] [name] reports that she has never smoked. She has never used smokeless tobacco. [name] [name] reports that she does not drink alcohol.   Review of Systems CONSTITUTIONAL: No weight loss, fever, chills, weakness or fatigue.  HEENT: Eyes: No visual loss, blurred vision, double vision or yellow sclerae.No hearing loss, sneezing, congestion, runny nose or sore throat.  SKIN: No rash or itching.  CARDIOVASCULAR: no chest pain, no palpitations.  RESPIRATORY: No  shortness of breath, cough or sputum.  GASTROINTESTINAL: No anorexia, nausea, vomiting or diarrhea. No abdominal pain or blood.  GENITOURINARY: No burning on urination, no polyuria NEUROLOGICAL: No headache, dizziness, syncope, paralysis, ataxia, numbness or tingling in the extremities. No change in bowel or bladder control.  MUSCULOSKELETAL: No muscle, back pain, joint pain or stiffness.  LYMPHATICS: No enlarged nodes. No history of splenectomy.  PSYCHIATRIC: No history of depression or anxiety.  ENDOCRINOLOGIC: No reports of sweating, cold or heat intolerance. No polyuria or polydipsia.  Marland Kitchen   Physical Examination Vitals:   04/02/18 0956  BP: 131/77  Pulse: 74   Vitals:   04/02/18 0956  Weight: 167 lb (75.8 kg)  Height: 5\' 1"  (1.549 m)    Gen: resting comfortably, no acute distress HEENT: no scleral icterus, pupils equal round and reactive, no palptable cervical adenopathy,  CV: RRR, no m/r/g, no jvd Resp: Clear to auscultation bilaterally GI: abdomen is soft, non-tender, non-distended, normal bowel sounds, no hepatosplenomegaly MSK: extremities are warm, no edema.  Skin: warm, no rash Neuro:  no focal deficits Psych: appropriate affect   Diagnostic Studies 02/2017 echo Study Conclusions  - Left ventricle: The cavity size was normal. Wall thickness was increased in a pattern of mild LVH. Systolic function was vigorous. The estimated ejection fraction was in the range of 65% to 70%. Wall motion was normal; there were no regional wall motion abnormalities. Doppler parameters are consistent with abnormal left ventricular relaxation (grade 1 diastolic dysfunction). - Aortic valve: Valve area (VTI): 2.32 cm^2. Valve area (Vmax): 2.19 cm^2. Valve area (Vmean): 2.18 cm^2. - Technically adequate study.    02/2017 echo  Blood pressure demonstrated a hypertensive response to exercise.  There was no ST segment deviation noted during stress.  The study is  normal.  This is a low risk study.  Nuclear stress EF: 85%.     Assessment and Plan   1. SOB - negative cardiac workup just last year - no recent symptoms, continue to monitor.  - EKG today shows SR, no ischemic changes  2. HTN - bp at goal, continue curren tmeds  3. Hyperlipidemia - elevated LDL. In setting of HL and DM2 will try alternative statin, she had side effects on lipitor in the past.She has mild LFT elevation at baseline AST 60, ALT 50. - she will continue pravastatin, repeat labs  4. DM2 - continue statin, ACE-I  F/u 6 months       Arnoldo Lenis, M.D.

## 2018-04-02 NOTE — Patient Instructions (Signed)
Your physician wants you to follow-up in: [place] will receive a reminder letter in the mail two months in advance. If you don't receive a letter, please call our office to schedule the follow-up appointment.  Your physician recommends that you continue on your current medications as directed. Please refer to the Current Medication list given to you today.  Your physician recommends that you return for lab work - PLEASE FAST 6-8 HOURS PRIOR TO LAB WORK   Thank you for choosing [facility]!!

## 2018-04-10 ENCOUNTER — Other Ambulatory Visit: Payer: Self-pay | Admitting: Cardiology

## 2018-04-16 NOTE — Progress Notes (Signed)
Per J. Monday, CRNA, reviewed echo results from 02/2017, ok to proceed with colonoscopy at [facility].

## 2018-04-23 ENCOUNTER — Ambulatory Visit (AMBULATORY_SURGERY_CENTER): Payer: Self-pay

## 2018-04-23 ENCOUNTER — Encounter: Payer: Self-pay | Admitting: Gastroenterology

## 2018-04-23 VITALS — Ht 61.0 in | Wt 171.6 lb

## 2018-04-23 DIAGNOSIS — Z86010 Personal history of colonic polyps: Secondary | ICD-10-CM

## 2018-04-23 MED ORDER — NA SULFATE-K SULFATE-MG SULF 17.5-3.13-1.6 GM/177ML PO SOLN: 1.0000 | Freq: Once | ORAL | 0 refills | Status: AC

## 2018-04-23 NOTE — Progress Notes (Signed)
Denies allergies to eggs or soy products. Denies complication of anesthesia or sedation. Denies use of weight loss medication. Denies use of O2.   Emmi instructions declined.  

## 2018-05-07 ENCOUNTER — Ambulatory Visit (AMBULATORY_SURGERY_CENTER): Payer: 59 | Admitting: Gastroenterology

## 2018-05-07 ENCOUNTER — Encounter: Payer: Self-pay | Admitting: Gastroenterology

## 2018-05-07 VITALS — BP 135/67 | HR 69 | Temp 98.0°F | Resp 16 | Ht 61.0 in | Wt 167.0 lb

## 2018-05-07 DIAGNOSIS — D128 Benign neoplasm of rectum: Secondary | ICD-10-CM

## 2018-05-07 DIAGNOSIS — D129 Benign neoplasm of anus and anal canal: Secondary | ICD-10-CM

## 2018-05-07 DIAGNOSIS — Z86010 Personal history of colonic polyps: Secondary | ICD-10-CM | POA: Diagnosis not present

## 2018-05-07 MED ORDER — SODIUM CHLORIDE 0.9 % IV SOLN: 500.0000 mL | Freq: Once | INTRAVENOUS | Status: DC

## 2018-05-07 NOTE — Op Note (Signed)
[facility] Patient Name: [name] [name] Procedure Date: 05/07/2018 8:54 AM MRN: [id number] Endoscopist: Milus Banister , MD Age: 57 Referring MD:  Date of Birth: 1960/11/23 Gender: Female Account #: [id number] Procedure:                Colonoscopy Indications:              High risk colon cancer surveillance: Personal                            history of colonic polyps; Precancerous colon                            polyps: Colonoscopy Dr. Ardis Hughs 12/2014 found 5                            polyps, 4 were precancerous, recommended recall                            colonoscopy at 3 year interval. Medicines:                Monitored Anesthesia Care Procedure:                Pre-Anesthesia Assessment:                           - Prior to the procedure, a History and Physical                            was performed, and patient medications and                            allergies were reviewed. The patient's tolerance of                            previous anesthesia was also reviewed. The risks                            and benefits of the procedure and the sedation                            options and risks were discussed with the patient.                            All questions were answered, and informed consent                            was obtained. Prior Anticoagulants: The patient has                            taken no previous anticoagulant or antiplatelet                            agents. ASA Grade Assessment: II - A patient with  mild systemic disease. After reviewing the risks                            and benefits, the patient was deemed in                            satisfactory condition to undergo the procedure.                           After obtaining informed consent, the colonoscope                            was passed under direct vision. Throughout the                            procedure, the patient's blood pressure, pulse,  and                            oxygen saturations were monitored continuously. The                            Model PCF-H190DL [id number]) scope was introduced                            through the anus and advanced to the the cecum,                            identified by appendiceal orifice and ileocecal                            valve. The colonoscopy was performed without                            difficulty. The patient tolerated the procedure                            well. The quality of the bowel preparation was                            good. The ileocecal valve, appendiceal orifice, and                            rectum were photographed. Scope In: 8:57:51 AM Scope Out: 9:15:39 AM Scope Withdrawal Time: 0 hours 14 minutes 55 seconds  Total Procedure Duration: 0 hours 17 minutes 48 seconds  Findings:                 A 3 mm polyp was found in the rectum. The polyp was                            sessile. The polyp was removed with a cold snare.                            Resection and retrieval were complete.  Multiple small and large-mouthed diverticula were                            found in the left colon.                           External and internal hemorrhoids were found. The                            hemorrhoids were medium-sized.                           The exam was otherwise without abnormality on                            direct and retroflexion views. Complications:            No immediate complications. Estimated blood loss:                            None. Estimated Blood Loss:     Estimated blood loss: none. Impression:               - One 3 mm polyp in the rectum, removed with a cold                            snare. Resected and retrieved.                           - Diverticulosis in the left colon.                           - External and internal hemorrhoids.                           - The examination was otherwise normal  on direct                            and retroflexion views. Recommendation:           - Patient has a contact number available for                            emergencies. The signs and symptoms of potential                            delayed complications were discussed with the                            patient. Return to normal activities tomorrow.                            Written discharge instructions were provided to the                            patient.                           -  Resume previous diet.                           - Continue present medications.                           You will receive a letter within 2-3 weeks with the                            pathology results and my final recommendations.                           If the polyp(s) is proven to be 'pre-cancerous' on                            pathology, you will need repeat colonoscopy in 5                            years. Milus Banister, MD 05/07/2018 9:18:49 AM This report has been signed electronically.

## 2018-05-07 NOTE — Progress Notes (Signed)
A/ox3 pleased with MAC, report to RN 

## 2018-05-07 NOTE — Progress Notes (Signed)
Pt's states no medical or surgical changes since previsit or office visit. 

## 2018-05-07 NOTE — Patient Instructions (Signed)
   Information on polyps,diverticulosis,& hemorrhoids given to you today   Await pathology result on polyp removed   YOU HAD AN ENDOSCOPIC PROCEDURE TODAY AT [facility]:   Refer to the procedure report that was given to you for any specific questions about what was found during the examination.  If the procedure report does not answer your questions, please call your gastroenterologist to clarify.  If you requested that your care partner not be given the details of your procedure findings, then the procedure report has been included in a sealed envelope for you to review at your convenience later.  YOU SHOULD EXPECT: Some feelings of bloating in the abdomen. Passage of more gas than usual.  Walking can help get rid of the air that was put into your GI tract during the procedure and reduce the bloating. If you had a lower endoscopy (such as a colonoscopy or flexible sigmoidoscopy) you may notice spotting of blood in your stool or on the toilet paper. If you underwent a bowel prep for your procedure, you may not have a normal bowel movement for a few days.  Please Note:  You might notice some irritation and congestion in your nose or some drainage.  This is from the oxygen used during your procedure.  There is no need for concern and it should clear up in a day or so.  SYMPTOMS TO REPORT IMMEDIATELY:   Following lower endoscopy (colonoscopy or flexible sigmoidoscopy):  Excessive amounts of blood in the stool  Significant tenderness or worsening of abdominal pains  Swelling of the abdomen that is new, acute  Fever of 100F or higher    For urgent or emergent issues, a gastroenterologist can be reached at any hour by calling [phone].   DIET:  We do recommend a small meal at first, but then you may proceed to your regular diet.  Drink plenty of fluids but you should avoid alcoholic beverages for 24 hours.  ACTIVITY:  You should plan to take it easy for the rest of  today and you should NOT DRIVE or use heavy machinery until tomorrow (because of the sedation medicines used during the test).    FOLLOW UP: Our staff will call the number listed on your records the next business day following your procedure to check on you and address any questions or concerns that you may have regarding the information given to you following your procedure. If we do not reach you, we will leave a message.  However, if you are feeling well and you are not experiencing any problems, there is no need to return our call.  We will assume that you have returned to your regular daily activities without incident.  If any biopsies were taken you will be contacted by phone or by letter within the next 1-3 weeks.  Please call us at [phone] if you have not heard about the biopsies in 3 weeks.    SIGNATURES/CONFIDENTIALITY: You and/or your care partner have signed paperwork which will be entered into your electronic medical record.  These signatures attest to the fact that that the information above on your After Visit Summary has been reviewed and is understood.  Full responsibility of the confidentiality of this discharge information lies with you and/or your care-partner.

## 2018-05-07 NOTE — Progress Notes (Signed)
Called to room to assist during endoscopic procedure.  Patient ID and intended procedure confirmed with present staff. Received instructions for my participation in the procedure from the performing physician.  

## 2018-05-08 ENCOUNTER — Telehealth: Payer: Self-pay | Admitting: *Deleted

## 2018-05-08 NOTE — Telephone Encounter (Signed)
  Follow up Call-  Call back number 05/07/2018  Post procedure Call Back phone  # [phone]  Permission to leave phone message Yes  Some recent data might be hidden     Patient questions:  Do you have a fever, pain , or abdominal swelling? Yes.   Pain Score  0 *  Have you tolerated food without any problems? Yes.    Have you been able to return to your normal activities? Yes.    Do you have any questions about your discharge instructions: Diet   No. Medications  No. Follow up visit  No.  Do you have questions or concerns about your Care? No.  Actions: * If pain score is 4 or above: No action needed, pain <4.

## 2018-05-10 ENCOUNTER — Encounter: Payer: Self-pay | Admitting: Gastroenterology

## 2018-06-17 IMAGING — MG 2D DIGITAL DIAGNOSTIC UNILATERAL RIGHT MAMMOGRAM WITH CAD AND AD
6 series · 6 of 14 positions shown · non-contrast
Comparison: Mammography 07/17/2016.

CLINICAL DATA: Recall from 2D screening mammography, possible mass
in the lower outer right breast at anterior to middle depth.

EXAM:
2D DIGITAL DIAGNOSTIC RIGHT MAMMOGRAM WITH CAD AND ADJUNCT TOMO
ULTRASOUND RIGHT BREAST

[R MLO]
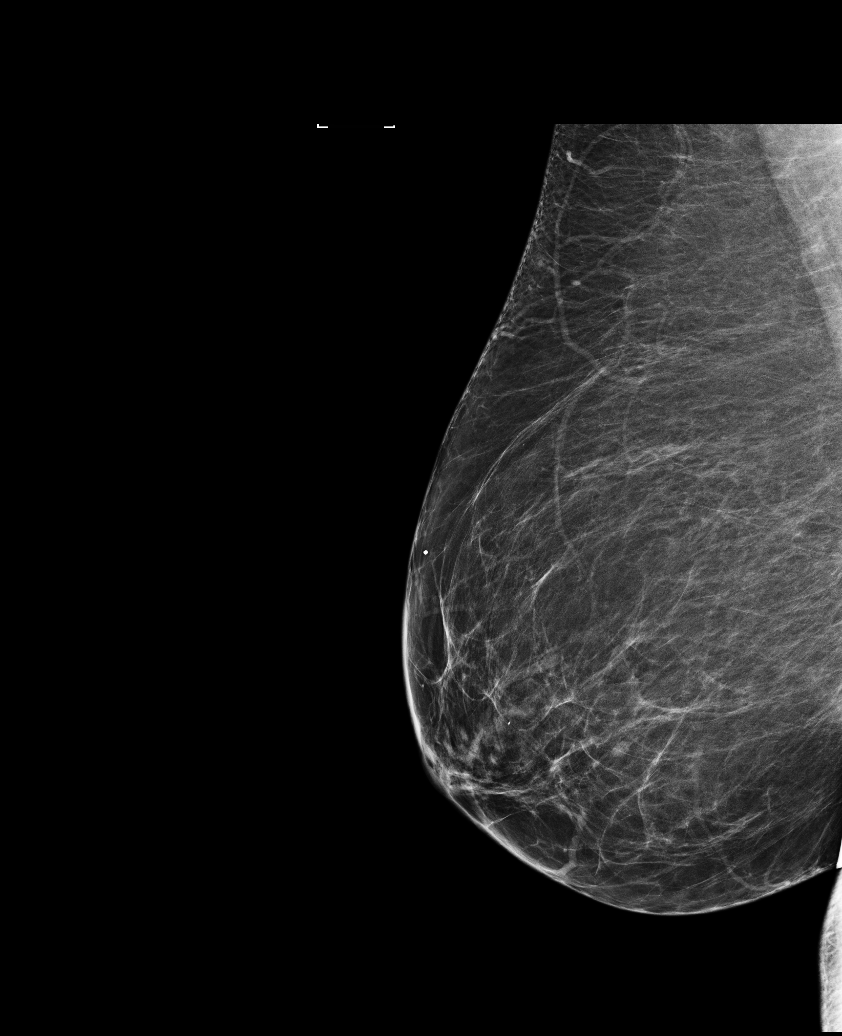

[R CC synth-2D]
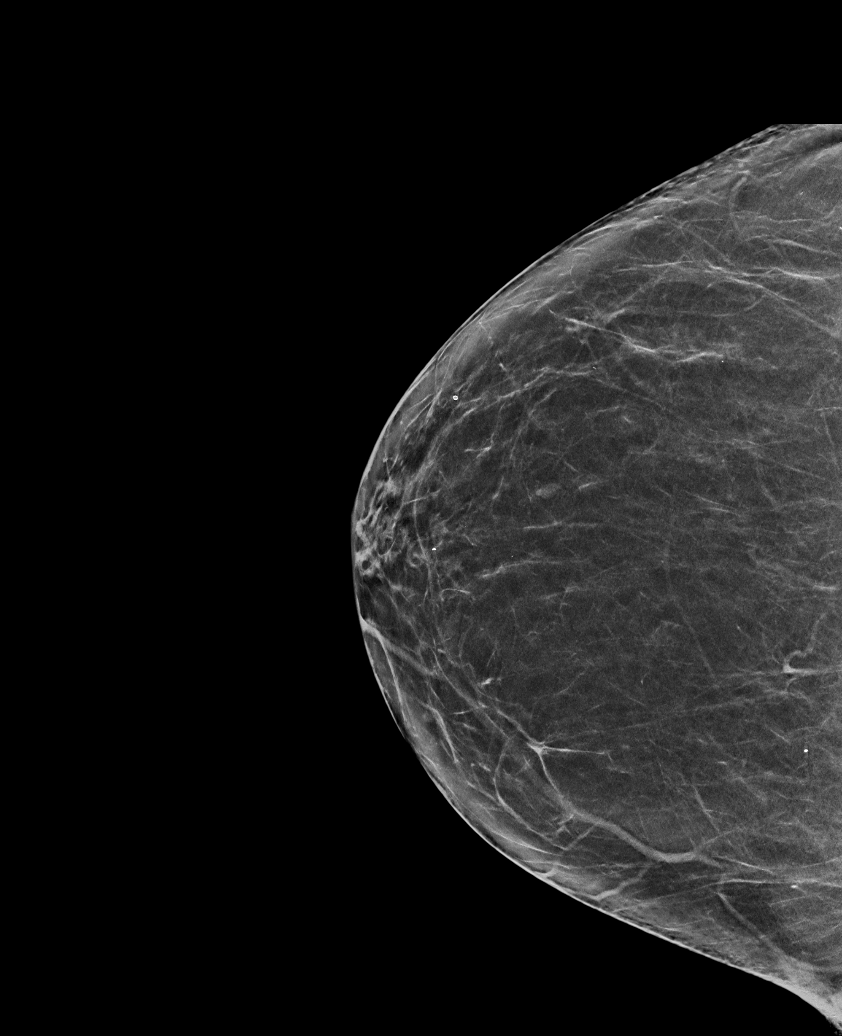

[R MLO synth-2D]
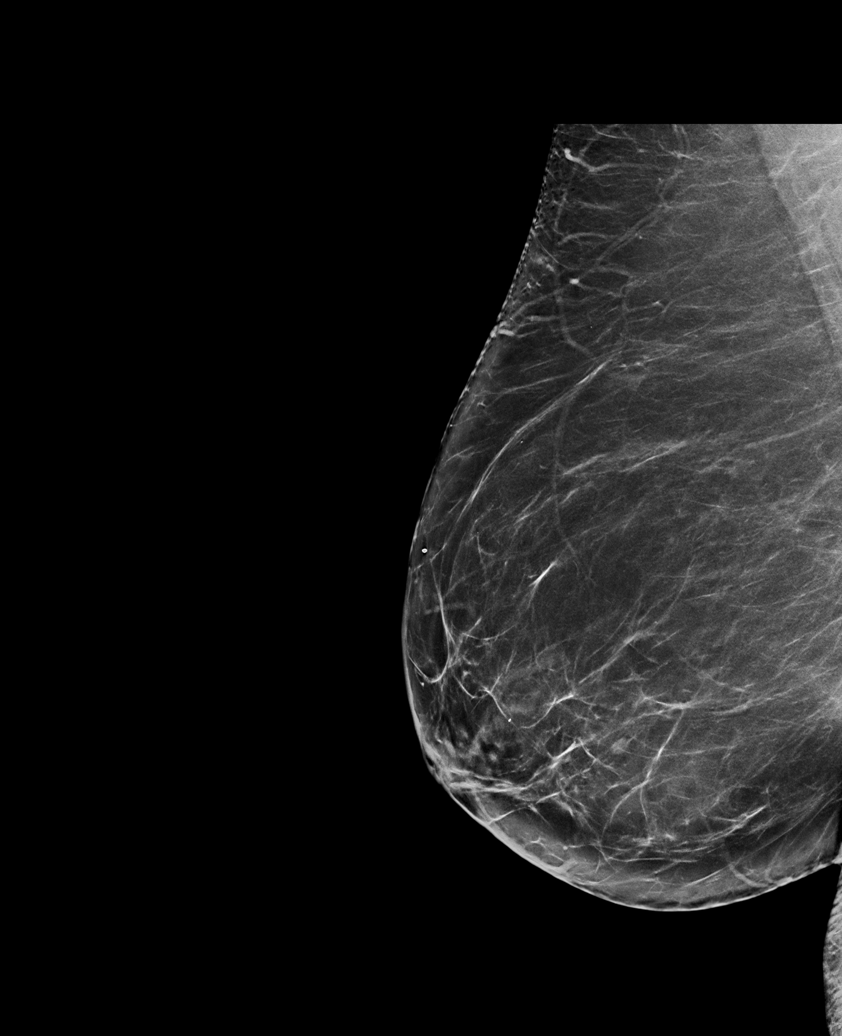

[R CC]
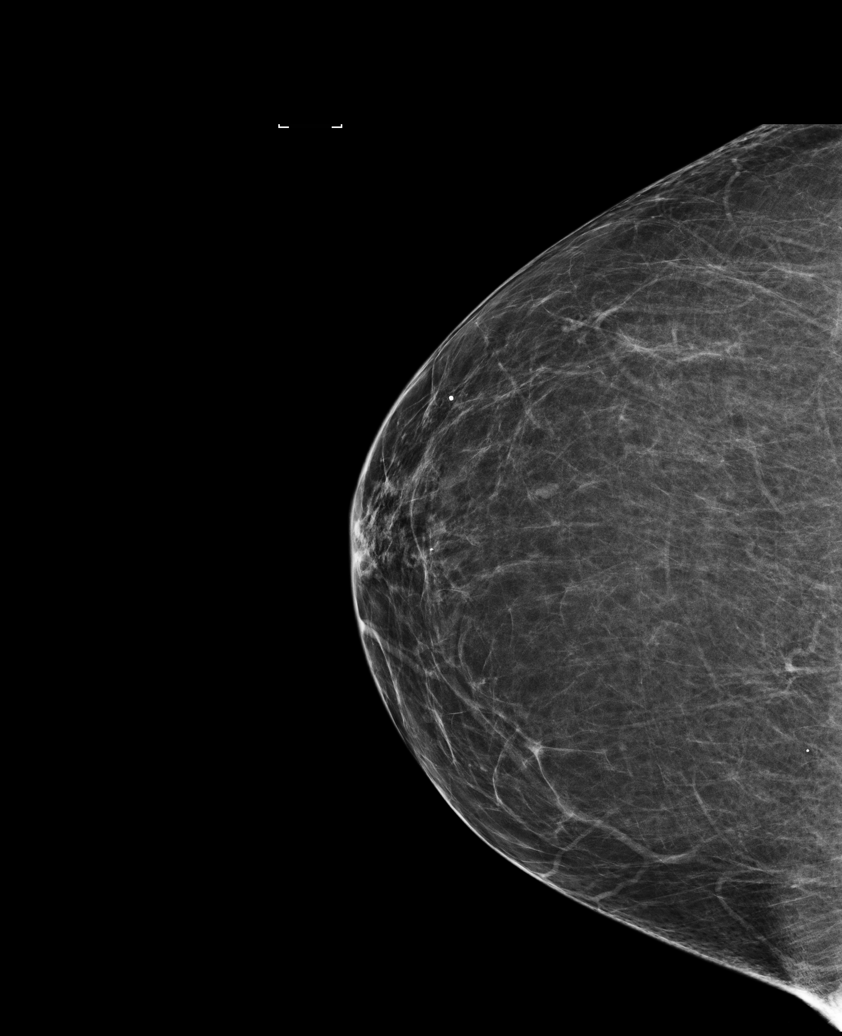

[R MLO tomo · tomo slice 42/83.0]
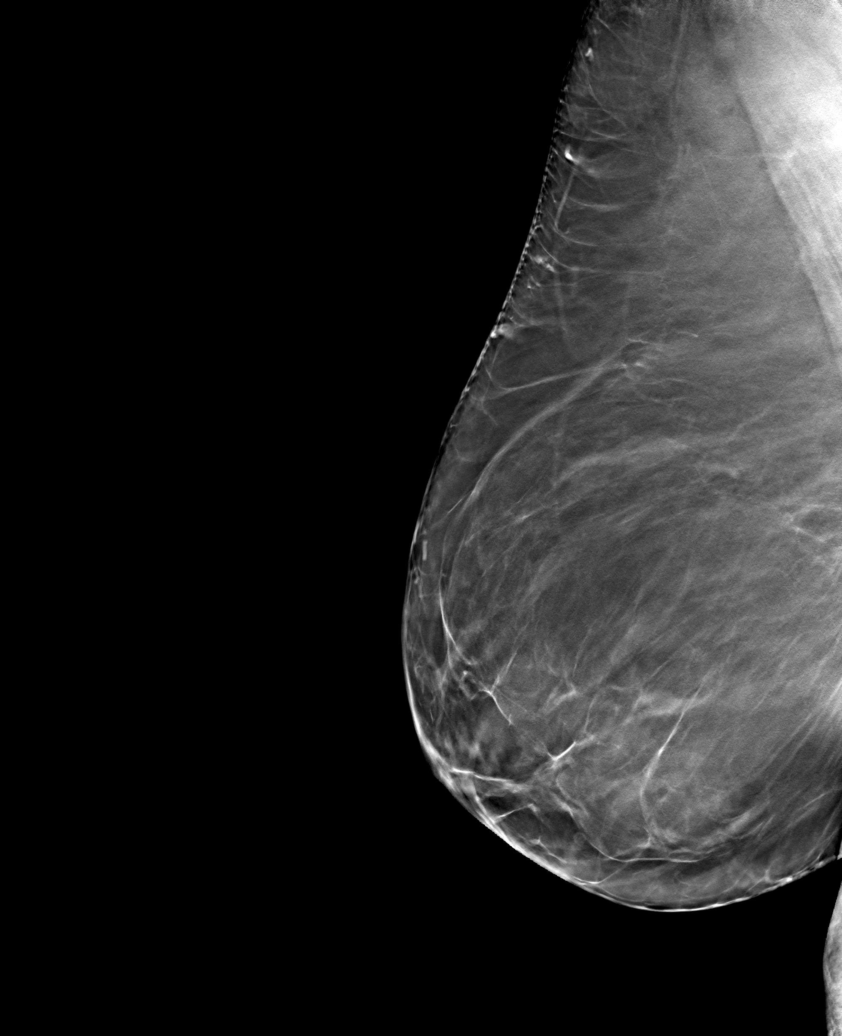

[R CC tomo · tomo slice 35/70.0]
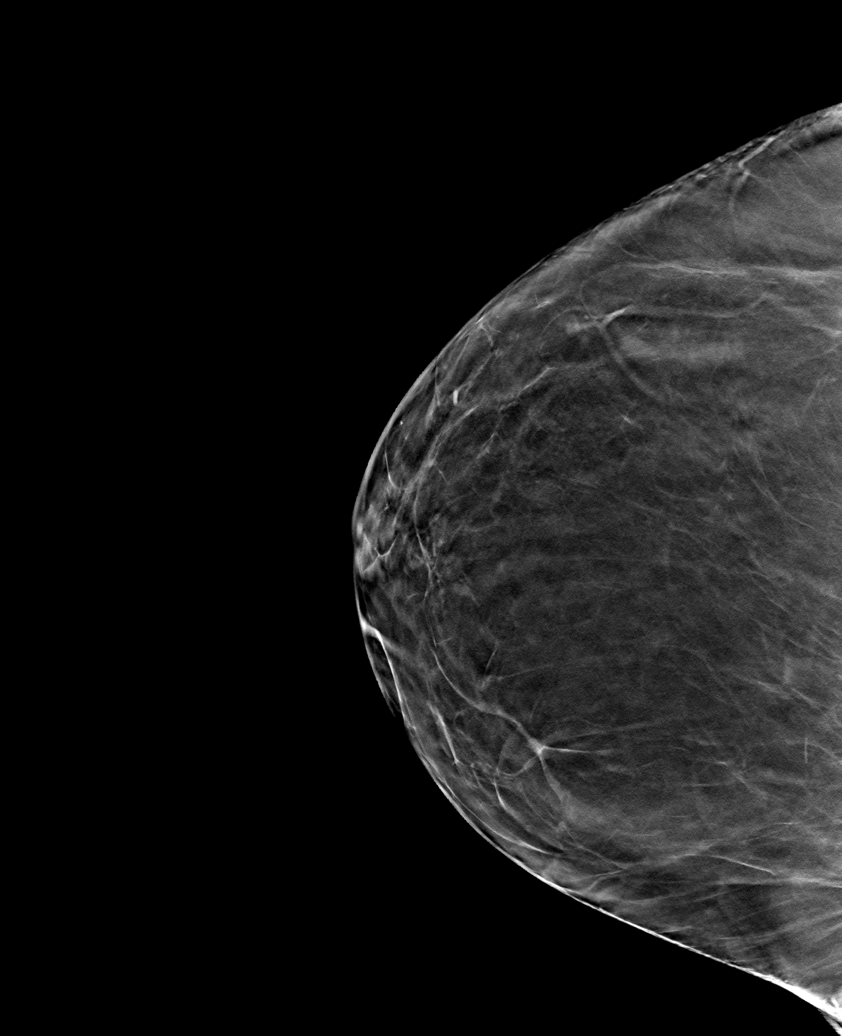

[6 of 14 positions shown; findings below may reference images not displayed]

No prior ultrasound.

ACR Breast Density Category b: There are scattered areas of
fibroglandular density.
FINDINGS: Standard 2D and tomosynthesis full field CC and MLO views of the
right breast were obtained. These confirm an oval circumscribed
low-density mass in the lower outer quadrant without associated
architectural distortion or suspicious calcification, accounting for
the area of concern on 2D screening mammography. No suspicious
findings elsewhere in the right breast.

Mammographic images were processed with CAD.

On physical exam, there is no palpable abnormality in the lower
outer right breast.

Targeted right breast ultrasound is performed, showing a
circumscribed oval parallel nearly anechoic mass with acoustic
enhancement and no internal power Doppler flow at the 8 o'clock
position approximately 2 cm from the nipple measuring approximately
2 x 3 x 5 mm. Normal sized ducts are visualized entering the
anechoic mass. This accounts for the area of concern on screening
mammography. No suspicious solid mass or abnormal acoustic shadowing
is identified.
IMPRESSION: Benign cyst or focally dilated duct in the lower outer right breast
accounting for the area of concern on screening mammography. No
mammographic or sonographic evidence of malignancy, right breast.

RECOMMENDATION:
Screening mammogram in one year.(Code:[id number]-2U4)

I have discussed the findings and recommendations with the patient.
Results were also provided in writing at the conclusion of the
visit. If applicable, a reminder letter will be sent to the patient
regarding the next appointment.

BI-RADS CATEGORY  2: Benign.

## 2018-07-03 ENCOUNTER — Other Ambulatory Visit: Payer: Self-pay | Admitting: Gynecology

## 2018-07-03 ENCOUNTER — Ambulatory Visit
Admission: RE | Admit: 2018-07-03 | Discharge: 2018-07-03 | Disposition: A | Discharge status: Home / Self Care | Payer: 59 | Source: Ambulatory Visit | Attending: Gynecology | Admitting: Gynecology

## 2018-07-03 DIAGNOSIS — Z1231 Encounter for screening mammogram for malignant neoplasm of breast: Secondary | ICD-10-CM

## 2018-09-20 ENCOUNTER — Encounter: Payer: Self-pay | Admitting: *Deleted

## 2018-09-23 ENCOUNTER — Encounter: Payer: Self-pay | Admitting: Cardiology

## 2018-09-23 ENCOUNTER — Ambulatory Visit: Payer: 59 | Admitting: Cardiology

## 2018-09-23 ENCOUNTER — Telehealth: Payer: Self-pay | Admitting: Cardiology

## 2018-09-23 VITALS — BP 125/81 | HR 75 | Ht 61.0 in | Wt 160.6 lb

## 2018-09-23 DIAGNOSIS — R0602 Shortness of breath: Secondary | ICD-10-CM

## 2018-09-23 DIAGNOSIS — I10 Essential (primary) hypertension: Secondary | ICD-10-CM | POA: Diagnosis not present

## 2018-09-23 DIAGNOSIS — I5032 Chronic diastolic (congestive) heart failure: Secondary | ICD-10-CM

## 2018-09-23 NOTE — Patient Instructions (Signed)
Medication Instructions:  Continue all current medications.  Labwork:  BMET, Mg, TSH - orders given today.   Office will contact with results via phone or letter.    Testing/Procedures:  Your physician has requested that you have an echocardiogram. Echocardiography is a painless test that uses sound waves to create images of your heart. It provides your doctor with information about the size and shape of your heart and how well your heart's chambers and valves are working. This procedure takes approximately one hour. There are no restrictions for this procedure.  Office will contact with results via phone or letter.    Follow-Up: 2 months   Any Other Special Instructions Will Be Listed Below (If Applicable).  If you need a refill on your cardiac medications before your next appointment, please call your pharmacy.

## 2018-09-23 NOTE — Progress Notes (Signed)
Clinical Summary [name] [name] is a 58 y.o.female seen today for follow up of the following medical problems. This is a focused visit on recent admission for diastolic HF.   1. Chronic diastolic HF - long history, evaluted by Lutricia Feil several years ago - 2005 normal coronaries. 2009 normal stress echo. 2014 stress echo negative.  - chronic ST/T changes on EKG - last visit reported symptoms of SOB and chest tightness.   02/2017 echo LVEF 65-70%, no WMAs, grade I diastolic dysfunction 12/7652 nuclear stress test no ischemia, exercised 4 minutes   - admitted Jan 2020 to [facility] with cough, congestion, SOB - CXR showed diffuse interstitial pattern. BNP 1226 - CT chest negative for PE. +ground glass opacity. Enlarged PA -  Managed for diastolic HF  - recent SOB, +orthopnea. Nonproductive cough. Some abdominal distension. Some noted some weight gain, up to 171 lbs. Normal 165 lbs.  - high sodium intake.  -no recurrence of symtoms since discharge.      SH: Husband is patient of mine, Medical laboratory scientific officer.   Past Medical History:  Diagnosis Date  . Abnormal EKG   . Allergy   . Anxiety    PTSD  . Depression   . Diabetes mellitus without complication ([place])   . Dyspnea   . GERD (gastroesophageal reflux disease)   . Hyperlipidemia   . Hypertension    Unspecified  . Seizures ([place])   . Sleep apnea    no CPAP worn  . Stroke [facility])      Allergies  Allergen Reactions  . Codeine Rash  . Doxycycline Rash  . Erythromycin Rash     Current Outpatient Medications  Medication Sig Dispense Refill  . ALPRAZolam (XANAX) 0.5 MG tablet Take 0.5 mg by mouth 3 (three) times daily as needed for anxiety.   5  . aspirin 81 MG tablet Take 81 mg by mouth at bedtime.     . bisoprolol-hydrochlorothiazide (ZIAC) 10-6.25 MG per tablet Take 1 tablet by mouth every morning.   12  . buPROPion (WELLBUTRIN SR) 150 MG 12 hr tablet Take 150 mg by mouth daily.    . citalopram (CELEXA) 20 MG  tablet Take 20 mg by mouth at bedtime.  1  . gabapentin (NEURONTIN) 100 MG capsule Take 200 mg by mouth 3 (three) times daily.   4  . glimepiride (AMARYL) 2 MG tablet Take 2-4 mg by mouth See admin instructions. 4 mg in the morning 2 mg in the evening    . HYDROcodone-acetaminophen (NORCO/VICODIN) 5-325 MG tablet Take 2 tablets by mouth every 6 (six) hours as needed. (Patient not taking: Reported on 05/07/2018) 15 tablet 0  . lisinopril (PRINIVIL,ZESTRIL) 5 MG tablet Take 1 tablet (5 mg total) by mouth daily. 90 tablet 3  . omeprazole (PRILOSEC) 20 MG capsule Take 20 mg by mouth at bedtime.   9  . ondansetron (ZOFRAN ODT) 4 MG disintegrating tablet Take 1 tablet (4 mg total) by mouth every 8 (eight) hours as needed for nausea or vomiting. (Patient not taking: Reported on 05/07/2018) 10 tablet 0  . pravastatin (PRAVACHOL) 20 MG tablet TAKE 1 TABLET BY MOUTH EVERY DAY IN THE EVENING 90 tablet 1  . venlafaxine XR (EFFEXOR-XR) 75 MG 24 hr capsule Take 2 capsules by mouth daily.  1  . verapamil (CALAN-SR) 120 MG CR tablet Take 120 mg by mouth at bedtime.   11   No current facility-administered medications for this visit.  Past Surgical History:  Procedure Laterality Date  . HYSTEROSCOPY  2001   submucous myomectomy  . TOTAL ABDOMINAL HYSTERECTOMY  2002   TAH BSO  pathology showed leiomyoma, adenomyosis, simple and complex hyperplasia without atypia  . TUMOR REMOVAL     from ear     Allergies  Allergen Reactions  . Codeine Rash  . Doxycycline Rash  . Erythromycin Rash      Family History  Problem Relation Age of Onset  . Cancer Father        Prostate  . Coronary artery disease Paternal Grandfather   . Uterine cancer Mother   . Irritable bowel syndrome Mother   . Stroke Mother   . Heart disease Maternal Grandmother   . Heart attack Brother   . Cancer Brother        Prostate  . Colitis Neg Hx   . Esophageal cancer Neg Hx   . Stomach cancer Neg Hx   . Rectal cancer Neg Hx        Social History [name] [name] reports that she has never smoked. She has never used smokeless tobacco. [name] [name] reports no history of alcohol use.   Review of Systems CONSTITUTIONAL: No weight loss, fever, chills, weakness or fatigue.  HEENT: Eyes: No visual loss, blurred vision, double vision or yellow sclerae.No hearing loss, sneezing, congestion, runny nose or sore throat.  SKIN: No rash or itching.  CARDIOVASCULAR: per hpi RESPIRATORY: per hpi GASTROINTESTINAL: No anorexia, nausea, vomiting or diarrhea. No abdominal pain or blood.  GENITOURINARY: No burning on urination, no polyuria NEUROLOGICAL: No headache, dizziness, syncope, paralysis, ataxia, numbness or tingling in the extremities. No change in bowel or bladder control.  MUSCULOSKELETAL: No muscle, back pain, joint pain or stiffness.  LYMPHATICS: No enlarged nodes. No history of splenectomy.  PSYCHIATRIC: No history of depression or anxiety.  ENDOCRINOLOGIC: No reports of sweating, cold or heat intolerance. No polyuria or polydipsia.  Marland Kitchen   Physical Examination Vitals:   09/23/18 1023  BP: 125/81  Pulse: 75  SpO2: 99%   Vitals:   09/23/18 1023  Weight: 160 lb 9.6 oz (72.8 kg)  Height: 5\' 1"  (1.549 m)    Gen: resting comfortably, no acute distress HEENT: no scleral icterus, pupils equal round and reactive, no palptable cervical adenopathy,  CV: RRR, no m/r/g, no jvd Resp: Clear to auscultation bilaterally GI: abdomen is soft, non-tender, non-distended, normal bowel sounds, no hepatosplenomegaly MSK: extremities are warm, no edema.  Skin: warm, no rash Neuro:  no focal deficits Psych: appropriate affect   Diagnostic Studies  02/2017 echo Study Conclusions  - Left ventricle: The cavity size was normal. Wall thickness was increased in a pattern of mild LVH. Systolic function was vigorous. The estimated ejection fraction was in the range of 65% to 70%. Wall motion was normal; there were no regional  wall motion abnormalities. Doppler parameters are consistent with abnormal left ventricular relaxation (grade 1 diastolic dysfunction). - Aortic valve: Valve area (VTI): 2.32 cm^2. Valve area (Vmax): 2.19 cm^2. Valve area (Vmean): 2.18 cm^2. - Technically adequate study.    02/2017 echo  Blood pressure demonstrated a hypertensive response to exercise.  There was no ST segment deviation noted during stress.  The study is normal.  This is a low risk study.  Nuclear stress EF: 85%.   Assessment and Plan   1. Chronic diastolic HF - recent admission with fluid overload - appears euvolemic today, no recurrent symptoms - counseled on low sodium diet, very high  sodium over holidays prior to admission - repeat echo, check BMET/Mg/TSH - pending labs results may change her lasix to just prn. Some orthostasis in clinic today, SBP drops 12 points and DBP drops 12 points. If uptrend in Cr/BUN would adjust her lasix.      Arnoldo Lenis, M.D.

## 2018-09-23 NOTE — Telephone Encounter (Signed)
°  Precert needed for: Echo     Location: [facility]    Date: Sep 25, 2018

## 2018-09-25 ENCOUNTER — Other Ambulatory Visit (HOSPITAL_COMMUNITY)
Admission: RE | Admit: 2018-09-25 | Discharge: 2018-09-25 | Disposition: A | Discharge status: Home / Self Care | Payer: 59 | Source: Ambulatory Visit | Attending: Cardiology | Admitting: Cardiology

## 2018-09-25 ENCOUNTER — Ambulatory Visit (HOSPITAL_COMMUNITY)
Admission: RE | Admit: 2018-09-25 | Discharge: 2018-09-25 | Disposition: A | Discharge status: Home / Self Care | Payer: 59 | Source: Ambulatory Visit | Attending: Cardiology | Admitting: Cardiology

## 2018-09-25 DIAGNOSIS — I5032 Chronic diastolic (congestive) heart failure: Secondary | ICD-10-CM | POA: Insufficient documentation

## 2018-09-25 DIAGNOSIS — R0602 Shortness of breath: Secondary | ICD-10-CM | POA: Insufficient documentation

## 2018-09-25 DIAGNOSIS — I5031 Acute diastolic (congestive) heart failure: Secondary | ICD-10-CM

## 2018-09-25 DIAGNOSIS — I10 Essential (primary) hypertension: Secondary | ICD-10-CM

## 2018-09-25 LAB — BASIC METABOLIC PANEL WITH GFR
Anion gap: 11 (ref 5–15)
BUN: 25 mg/dL — ABNORMAL HIGH (ref 6–20)
CO2: 22 mmol/L (ref 22–32)
Calcium: 10 mg/dL (ref 8.9–10.3)
Chloride: 102 mmol/L (ref 98–111)
Creatinine, Ser: 0.81 mg/dL (ref 0.44–1.00)
GFR calc Af Amer: >60 mL/min
GFR calc non Af Amer: >60 mL/min
Glucose, Bld: 179 mg/dL — ABNORMAL HIGH (ref 70–99)
Potassium: 3.9 mmol/L (ref 3.5–5.1)
Sodium: 135 mmol/L (ref 135–145)

## 2018-09-25 LAB — TSH: TSH: 2.596 u[IU]/mL (ref 0.350–4.500)

## 2018-09-25 LAB — MAGNESIUM: Magnesium: 1.9 mg/dL (ref 1.7–2.4)

## 2018-09-25 NOTE — Progress Notes (Signed)
*  PRELIMINARY RESULTS* Echocardiogram 2D Echocardiogram has been performed.  [name] [name] 09/25/2018, 12:22 PM

## 2018-09-26 ENCOUNTER — Telehealth: Payer: Self-pay | Admitting: Cardiology

## 2018-09-26 NOTE — Telephone Encounter (Signed)
Patient called office to report that she has felt nauseated all day. Reports not eating breakfast but did eat lunch. Also says she feels tired. No c/o fever or vomiting. No c/o dizziness, chest pain or sob. Advised patient to contact her PCP with these symptoms and possible evaluation. Verbalized understanding.

## 2018-09-26 NOTE — Telephone Encounter (Signed)
Let her know her labs show a little dehydration, change her lasix to just prn swelling or weight gain please   Zandra Abts MD

## 2018-09-26 NOTE — Telephone Encounter (Signed)
Patient called stating that she has been nauseated all day (started at 830am) no other symptoms noted. States that she is fatigue. Please call [phone].

## 2018-09-27 ENCOUNTER — Telehealth: Payer: Self-pay | Admitting: *Deleted

## 2018-09-27 MED ORDER — FUROSEMIDE 20 MG PO TABS: ORAL_TABLET | ORAL | 1 refills | Status: DC

## 2018-09-27 NOTE — Telephone Encounter (Signed)
Pt aware - routed to pcp  

## 2018-09-27 NOTE — Addendum Note (Signed)
Addended by: Julian Hy T on: 09/27/2018 10:36 AM   Modules accepted: Orders

## 2018-09-27 NOTE — Telephone Encounter (Signed)
Pt aware and voiced understanding - updated medication list and refills of lasix sent to [facility]

## 2018-09-27 NOTE — Telephone Encounter (Signed)
-----   Message from Arnoldo Lenis, MD sent at 09/27/2018 12:28 PM EST ----- Echo looks good, no major changes in heart function  Zandra Abts MD

## 2018-10-02 ENCOUNTER — Other Ambulatory Visit: Payer: 59

## 2018-10-02 ENCOUNTER — Other Ambulatory Visit: Payer: Self-pay | Admitting: Cardiology

## 2018-10-02 ENCOUNTER — Telehealth: Payer: Self-pay | Admitting: Cardiology

## 2018-10-02 NOTE — Telephone Encounter (Signed)
Pt began having chest heaviness and SOB, tonight so she weighted and her wt was up 3 lbs so she took 20 of lasix and KDur,,  She was feeling better at time of call.  If chest pressure re-occurs she should go to ER.  otherwise of SOB comes back tomorrow to call office for appt.  Will send this to Dr. Harl Bowie.

## 2018-10-04 NOTE — Telephone Encounter (Signed)
Can we f/u on patient.   Zandra Abts MD

## 2018-10-04 NOTE — Telephone Encounter (Signed)
Called pt., no answer. Left message for pt to return call.  

## 2018-10-07 NOTE — Telephone Encounter (Signed)
Called pt to get update on symptoms. She stated that she is has not had anymore chest pain, nor SOB. She felt a little tired yesterday, but that is because she hasn't slept well recently. I advised her to call office if she started having any further issues. She voiced understanding of plan.

## 2018-10-30 ENCOUNTER — Telehealth: Payer: Self-pay | Admitting: Cardiology

## 2018-10-30 ENCOUNTER — Encounter: Payer: Self-pay | Admitting: *Deleted

## 2018-10-30 NOTE — Telephone Encounter (Signed)
Patient called stating that she was d/c [facility] 10-30-2018 for chest pains . Patient is requesting that we get recent records.so that Dr. Harl Bowie will know.

## 2018-10-30 NOTE — Telephone Encounter (Signed)
Records requested

## 2018-10-31 ENCOUNTER — Other Ambulatory Visit: Payer: Self-pay

## 2018-10-31 ENCOUNTER — Emergency Department (HOSPITAL_COMMUNITY)
Admission: EM | Admit: 2018-10-31 | Discharge: 2018-11-01 | Disposition: A | Discharge status: Home / Self Care | Payer: 59 | Attending: Emergency Medicine | Admitting: Emergency Medicine

## 2018-10-31 ENCOUNTER — Emergency Department (HOSPITAL_COMMUNITY): Payer: 59

## 2018-10-31 ENCOUNTER — Encounter (HOSPITAL_COMMUNITY): Payer: Self-pay | Admitting: Emergency Medicine

## 2018-10-31 DIAGNOSIS — Z7982 Long term (current) use of aspirin: Secondary | ICD-10-CM | POA: Insufficient documentation

## 2018-10-31 DIAGNOSIS — I10 Essential (primary) hypertension: Secondary | ICD-10-CM | POA: Diagnosis not present

## 2018-10-31 DIAGNOSIS — R5383 Other fatigue: Secondary | ICD-10-CM

## 2018-10-31 DIAGNOSIS — Z7984 Long term (current) use of oral hypoglycemic drugs: Secondary | ICD-10-CM | POA: Diagnosis not present

## 2018-10-31 DIAGNOSIS — R42 Dizziness and giddiness: Secondary | ICD-10-CM | POA: Insufficient documentation

## 2018-10-31 DIAGNOSIS — R079 Chest pain, unspecified: Secondary | ICD-10-CM

## 2018-10-31 DIAGNOSIS — Z79899 Other long term (current) drug therapy: Secondary | ICD-10-CM | POA: Diagnosis not present

## 2018-10-31 DIAGNOSIS — E119 Type 2 diabetes mellitus without complications: Secondary | ICD-10-CM | POA: Insufficient documentation

## 2018-10-31 MED ORDER — SODIUM CHLORIDE 0.9 % IV BOLUS
250.0000 mL | Freq: Once | INTRAVENOUS | Status: AC
  Administered 2018-11-01: 250 mL via INTRAVENOUS

## 2018-10-31 NOTE — ED Notes (Signed)
Pt back from x-ray.

## 2018-10-31 NOTE — ED Triage Notes (Signed)
Pt to ED with c/o generalized weakness.  St's her legs feel very weak.  Pt was admitted in [place] on Tues and discharged yesterday for same.  Pt alert and oriented x's 3

## 2018-10-31 NOTE — ED Notes (Signed)
Patient transported to X-ray 

## 2018-10-31 NOTE — ED Provider Notes (Signed)
[facility] EMERGENCY DEPARTMENT Provider Note  CSN: [id number] Arrival date & time: 10/31/18 2247  Chief Complaint(s) Fatigue  HPI [name] [name] is a 58 y.o. female with extensive past medical history listed below including diabetes, prior strokes who presents to the emergency department for generalized fatigue and dizziness.  She reports that she initially felt the symptoms earlier in the week and was admitted to OSH and evaluated by neurology, ruled out for stroke.  Patient reports that she was discharged 2 days ago after her symptoms had resolved.  She returns today for recurrent symptoms.  Reports that after dinner, she began having fatigue and dizziness with bilateral lower extremity weakness.  This was similar to her previous hospitalization.  Reports that her symptoms worsen while standing and resolved after lying down.  She denies any recent fevers or infections.  She endorses associated nausea without emesis.  No chest pain or shortness of breath.  No abdominal pain.  No diarrhea.  Patient does report that she was hospitalized in January for CHF exacerbation.   HPI  Past Medical History Past Medical History:  Diagnosis Date  . Abnormal EKG   . Allergy   . Anxiety    PTSD  . Depression   . Diabetes mellitus without complication ([place])   . Dyspnea   . GERD (gastroesophageal reflux disease)   . Hyperlipidemia   . Hypertension    Unspecified  . Seizures ([place])   . Sleep apnea    no CPAP worn  . Stroke [facility])    Patient Active Problem List   Diagnosis Date Noted  . External hemorrhoids 12/08/2014  . Rectal bleeding 11/22/2014  . Hemorrhoids, internal 11/22/2014  . Essential hypertension 06/08/2009  . DYSPNEA 06/08/2009  . ABNORMAL EKG 06/08/2009   Home Medication(s) Prior to Admission medications   Medication Sig Start Date End Date Taking? Authorizing Provider  ALPRAZolam Duanne Moron) 0.5 MG tablet Take 0.5 mg by mouth 2 (two) times daily as needed for  anxiety.  05/19/14  Yes [provider]  aspirin 81 MG tablet Take 81 mg by mouth at bedtime.    Yes [provider]  bisoprolol-hydrochlorothiazide (ZIAC) 10-6.25 MG per tablet Take 1 tablet by mouth daily.  07/02/14  Yes [provider]  buPROPion (WELLBUTRIN SR) 150 MG 12 hr tablet Take 150 mg by mouth daily.   Yes [provider]  citalopram (CELEXA) 20 MG tablet Take 30 mg by mouth daily.  05/01/18  Yes [provider]  furosemide (LASIX) 20 MG tablet TAKE 1 TABLET AS NEEDED FOR SWELLING/WEIGHT GAIN Patient taking differently: Take 20 mg by mouth daily as needed for fluid.  09/27/18  Yes BranchAlphonse Guild, MD  gabapentin (NEURONTIN) 100 MG capsule Take 200 mg by mouth 2 (two) times daily.  12/19/17  Yes [provider]  glimepiride (AMARYL) 2 MG tablet Take 4 mg by mouth daily with breakfast.    Yes [provider]  lisinopril (PRINIVIL,ZESTRIL) 5 MG tablet Take 1 tablet (5 mg total) by mouth daily. 04/02/18  Yes BranchAlphonse Guild, MD  omeprazole (PRILOSEC) 20 MG capsule Take 20 mg by mouth at bedtime.  07/02/14  Yes [provider]  potassium chloride (K-DUR) 10 MEQ tablet Take 10-20 mEq by mouth See admin instructions. Take 2 tablets daily for 3 days (starting 10/31/18) then take 1 tablet daily   Yes [provider]  pravastatin (PRAVACHOL) 20 MG tablet TAKE 1 TABLET BY MOUTH EVERY DAY IN THE EVENING  Patient taking differently: Take 20 mg by mouth every evening.  10/02/18  Yes Branch, Alphonse Guild, MD  Semaglutide,0.25 or 0.5MG /DOS, (OZEMPIC, 0.25 OR 0.5 MG/DOSE,) 2 MG/1.5ML SOPN Inject 1.5 mLs into the skin every Wednesday.  08/14/18  Yes [provider]  verapamil (CALAN-SR) 120 MG CR tablet Take 120 mg by mouth at bedtime.  07/02/14  Yes [provider]  meclizine (ANTIVERT) 12.5 MG tablet Take 1 tablet (12.5 mg total) by mouth 3 (three) times daily as needed for dizziness. 11/01/18   Fatima Blank, MD                                                                                                                                    Past Surgical History Past Surgical History:  Procedure Laterality Date  . HYSTEROSCOPY  2001   submucous myomectomy  . TOTAL ABDOMINAL HYSTERECTOMY  2002   TAH BSO  pathology showed leiomyoma, adenomyosis, simple and complex hyperplasia without atypia  . TUMOR REMOVAL     from ear   Family History Family History  Problem Relation Age of Onset  . Cancer Father        Prostate  . Coronary artery disease Paternal Grandfather   . Uterine cancer Mother   . Irritable bowel syndrome Mother   . Stroke Mother   . Heart disease Maternal Grandmother   . Heart attack Brother   . Cancer Brother        Prostate  . Colitis Neg Hx   . Esophageal cancer Neg Hx   . Stomach cancer Neg Hx   . Rectal cancer Neg Hx     Social History Social History   Tobacco Use  . Smoking status: Never Smoker  . Smokeless tobacco: Never Used  Substance Use Topics  . Alcohol use: No    Alcohol/week: 0.0 standard drinks  . Drug use: No   Allergies Codeine; Doxycycline; and Erythromycin  Review of Systems Review of Systems All other systems are reviewed and are negative for acute change except as noted in the HPI  Physical Exam Vital Signs  I have reviewed the triage vital signs BP 136/70   Pulse 67   Temp 98 F (36.7 C) (Oral)   Resp 17   Ht 5\' 1"  (1.549 m)   Wt 71.7 kg   SpO2 97%   BMI 29.85 kg/m   Physical Exam Vitals signs reviewed.  Constitutional:      General: She is not in acute distress.    Appearance: She is well-developed. She is not diaphoretic.  HENT:     Head: Normocephalic and atraumatic.     Nose: Nose normal.  Eyes:     General: No scleral icterus.       Right eye: No discharge.        Left eye: No discharge.     Conjunctiva/sclera: Conjunctivae normal.     Pupils: Pupils  are equal, round, and reactive to light.  Neck:      Musculoskeletal: Normal range of motion and neck supple.  Cardiovascular:     Rate and Rhythm: Normal rate and regular rhythm.     Heart sounds: No murmur. No friction rub. No gallop.   Pulmonary:     Effort: Pulmonary effort is normal. No respiratory distress.     Breath sounds: Normal breath sounds. No stridor. No rales.  Abdominal:     General: There is no distension.     Palpations: Abdomen is soft.     Tenderness: There is no abdominal tenderness.  Musculoskeletal:        General: No tenderness.  Skin:    General: Skin is warm and dry.     Findings: No erythema or rash.  Neurological:     Mental Status: She is alert and oriented to person, place, and time.     Comments: Mental Status:  Alert and oriented to person, place, and time.  Attention and concentration normal.  Speech clear.  Recent memory is intact  Cranial Nerves:  II Visual Fields: Intact to confrontation. Visual fields intact. III, IV, VI: Pupils equal and reactive to light and near. Full eye movement without nystagmus  V Facial Sensation: Normal. No weakness of masticatory muscles  VII: No facial weakness or asymmetry  VIII Auditory Acuity: Grossly normal  IX/X: The uvula is midline; the palate elevates symmetrically  XI: Normal sternocleidomastoid and trapezius strength  XII: The tongue is midline. No atrophy or fasciculations.   Motor System: Muscle Strength: 5/5 and symmetric in the upper and lower extremities. No pronation or drift.  Muscle Tone: Tone and muscle bulk are normal in the upper and lower extremities.   Reflexes: DTRs: 1+ and symmetrical in all four extremities. No Clonus Coordination: Intact finger-to-nose, heel-to-shin. No tremor.  Sensation: Intact to light touch.  Gait: deferred due to dizziness while standing      ED Results and Treatments Labs (all labs ordered are listed, but only abnormal results are displayed) Labs Reviewed  COMPREHENSIVE METABOLIC PANEL - Abnormal; Notable  for the following components:      Result Value   Glucose, Bld 151 (*)    Alkaline Phosphatase 141 (*)    All other components within normal limits  CBC  BRAIN NATRIURETIC PEPTIDE  MAGNESIUM  I-STAT TROPONIN, ED                                                                                                                         EKG  EKG Interpretation  Date/Time:  Thursday October 31 2018 22:56:18 EST Ventricular Rate:  73 PR Interval:    QRS Duration: 87 QT Interval:  422 QTC Calculation: 465 R Axis:   64 Text Interpretation:  Sinus rhythm Minimal ST elevation, inferior leads NO STEMI. No old tracing to compare Reconfirmed by Addison Lank [phone]) on 11/01/2018 12:29:51 AM      Radiology Dg Chest 2 View  Result Date: 10/31/2018 CLINICAL DATA:  Generalized weakness EXAM: CHEST - 2 VIEW COMPARISON:  10/29/2018 FINDINGS: The heart size and mediastinal contours are within normal limits. Both lungs are clear. The visualized skeletal structures are unremarkable. IMPRESSION: No active cardiopulmonary disease.  No significant change. Electronically Signed   By: Ashley Royalty M.D.   On: 10/31/2018 23:49   Pertinent labs & imaging results that were available during my care of the patient were reviewed by me and considered in my medical decision making (see chart for details).  Medications Ordered in ED Medications  sodium chloride 0.9 % bolus 250 mL (0 mLs Intravenous Stopped 11/01/18 0147)  meclizine (ANTIVERT) tablet 12.5 mg (12.5 mg Oral Given 11/01/18 0155)                                                                                                                                    Procedures Procedures  (including critical care time)  Medical Decision Making / ED Course I have reviewed the nursing notes for this encounter and the patient's prior records (if available in EHR or on provided paperwork).    Exam is nonfocal.  Orthostatics were reassuring.  EKG without acute  ischemic changes, dysrhythmias, blocks.  Troponin negative.  BNP negative.  CBC without leukocytosis or anemia.  No significant electrolyte derangements or renal insufficiency.  There does not appear to be evidence of heart failure exacerbation.  This is not appear to be CVA.  Not suggestive of Bayard Males.  Patient was provided with small IV fluid bolus.  After which the patient had complete resolution of her symptoms.  She was able to ambulate without complication.  After returning to her bed, she reports that she turned her head to look at the monitor which triggered her dizziness again.  After meclizine, her symptoms significantly improved and she was again able to ambulate throughout the ED without complication.  Possible peripheral vertigo.  She reports that she is already scheduled to have a tilt test by her PCP.  The patient appears reasonably screened and/or stabilized for discharge and I doubt any other medical condition or other [facility] requiring further screening, evaluation, or treatment in the ED at this time prior to discharge.  The patient is safe for discharge with strict return precautions.   Final Clinical Impression(s) / ED Diagnoses Final diagnoses:  Other fatigue  Dizziness   Disposition: Discharge  Condition: Good  I have discussed the results, Dx and Tx plan with the patient who expressed understanding and agree(s) with the plan. Discharge instructions discussed at great length. The patient was given strict return precautions who verbalized understanding of the instructions. No further questions at time of discharge.    ED Discharge Orders         Ordered    meclizine (ANTIVERT) 12.5 MG tablet  3 times daily PRN     11/01/18 0223  Follow Up: Glenda Chroman, MD [address] [place] [address] [phone]  Schedule an appointment as soon as possible for a visit  in 3-5 days, If symptoms do not improve or  worsen      This chart was dictated  using voice recognition software.  Despite best efforts to proofread,  errors can occur which can change the documentation meaning.   Fatima Blank, MD 11/01/18 [phone]

## 2018-11-01 ENCOUNTER — Telehealth: Payer: Self-pay | Admitting: Cardiology

## 2018-11-01 LAB — COMPREHENSIVE METABOLIC PANEL
ALT: 26 U/L (ref 0–44)
AST: 26 U/L (ref 15–41)
Albumin: 4.2 g/dL (ref 3.5–5.0)
Alkaline Phosphatase: 141 U/L — ABNORMAL HIGH (ref 38–126)
Anion gap: 11 (ref 5–15)
BUN: 11 mg/dL (ref 6–20)
CO2: 25 mmol/L (ref 22–32)
Calcium: 9.9 mg/dL (ref 8.9–10.3)
Chloride: 103 mmol/L (ref 98–111)
Creatinine, Ser: 0.78 mg/dL (ref 0.44–1.00)
GFR calc Af Amer: >60 mL/min (ref >60)
GFR calc non Af Amer: >60 mL/min (ref >60)
Glucose, Bld: 151 mg/dL — ABNORMAL HIGH (ref 70–99)
Potassium: 3.5 mmol/L (ref 3.5–5.1)
Sodium: 139 mmol/L (ref 135–145)
Total Bilirubin: 0.6 mg/dL (ref 0.3–1.2)
Total Protein: 7 g/dL (ref 6.5–8.1)

## 2018-11-01 LAB — I-STAT TROPONIN, ED: Troponin i, poc: 0.01 ng/mL (ref 0.00–0.08)

## 2018-11-01 LAB — CBC
HCT: 40 % (ref 36.0–46.0)
Hemoglobin: 12.3 g/dL (ref 12.0–15.0)
MCH: 27.5 pg (ref 26.0–34.0)
MCHC: 30.8 g/dL (ref 30.0–36.0)
MCV: 89.5 fL (ref 80.0–100.0)
Platelets: 189 10*3/uL (ref 150–400)
RBC: 4.47 MIL/uL (ref 3.87–5.11)
RDW: 14.4 % (ref 11.5–15.5)
WBC: 7.1 10*3/uL (ref 4.0–10.5)
nRBC: 0 % (ref 0.0–0.2)

## 2018-11-01 LAB — MAGNESIUM: Magnesium: 2 mg/dL (ref 1.7–2.4)

## 2018-11-01 LAB — BRAIN NATRIURETIC PEPTIDE: B Natriuretic Peptide: 14.7 pg/mL (ref 0.0–100.0)

## 2018-11-01 MED ORDER — MECLIZINE HCL 25 MG PO TABS
12.5000 mg | ORAL_TABLET | Freq: Once | ORAL | Status: AC
  Administered 2018-11-01: 12.5 mg via ORAL
  Filled 2018-11-01: qty 1

## 2018-11-01 MED ORDER — MECLIZINE HCL 12.5 MG PO TABS: 12.5000 mg | ORAL_TABLET | Freq: Three times a day (TID) | ORAL | 0 refills | Status: DC | PRN

## 2018-11-01 NOTE — ED Notes (Addendum)
Ambulated pt up the hall and back. Pt did not drag feet like she states she had been doing. Pt also stated that she did not feel dizzy or unsteady. Pt hooked back up to monitor and comfortable.

## 2018-11-01 NOTE — ED Notes (Signed)
Ambulated pt with assistance. Walked farther this time. Pt seemed steady but stated that she felt mildly dizzy but nothing like before.

## 2018-11-01 NOTE — Telephone Encounter (Signed)
Went to [facility] due to not being able to walk and they started her on fluid medication

## 2018-11-01 NOTE — Telephone Encounter (Signed)
Noted. Will send to Dr. Harl Bowie as Juluis Rainier

## 2018-11-04 NOTE — Telephone Encounter (Signed)
Patient confirmed that she was given meclizine. Patient said that she was given fluids while in the ED and said that she feels better.

## 2018-11-04 NOTE — Telephone Encounter (Signed)
From ER notes looks like she got meclizine. Confirm this is what she is referring to, if so this is not acutally a fluid medication but a medicine that helps with dizzienss/vertigo   J Aaric Dolph MD

## 2018-11-14 ENCOUNTER — Encounter: Payer: Self-pay | Admitting: Neurology

## 2018-11-14 ENCOUNTER — Ambulatory Visit: Payer: 59 | Admitting: Neurology

## 2018-11-14 VITALS — BP 150/75 | HR 72 | Ht 61.0 in | Wt 159.3 lb

## 2018-11-14 DIAGNOSIS — G459 Transient cerebral ischemic attack, unspecified: Secondary | ICD-10-CM | POA: Diagnosis not present

## 2018-11-14 NOTE — Progress Notes (Signed)
Reason for visit: TIA  Referring physician: Dr. Michiel Cowboy [name] is a 58 y.o. female  History of present illness:  [name] [name] is a 58 year old white female with a history of anxiety and depression followed through psychiatry, she has a history of diabetes that was diagnosed a year ago, with a history of a diabetic neuropathy.  The patient has had multiple episodes since 2004 with events associated with diaphoresis, dizziness, cloudy headed sensations, and then she will feel fainty, and she may have trouble walking with dragging her feet.  The numbness is on both sides of the body, she at times feels that she cannot move.  She recently went to the hospital around 29 October 2018 with a typical event.  The original events began after the death of her brother unexpectedly.  The patient has at least one event every month or 2.  The episodes last about an hour, she will lie down and go to sleep and when she wakes up she feels better.  She has had events that occur when she gets to thinking about her brother, she will start becoming anxious and then start having diaphoresis, and this will evolve into a typical event as described above.  The patient denies palpitations of the heart, or problems with feeling short winded.  She has never blacked out with these events.  She claims that at age 68 she began having seizure events associated with loss of consciousness and generalized jerking, she was treated by Dr. Mervyn Skeeters through [facility] until she was age 89 and then the phenobarbital medication was discontinued.  The patient has not had any recurrent seizure events throughout her life.  The patient was told that she has had a prior left brain stroke with right-sided symptoms, she was seen by Dr. Erling Cruz previously.  The patient comes to this office for an evaluation.  A recent CT scan of the brain done in February questioned a right temporoparietal stroke event.  Past Medical History:  Diagnosis Date  . Abnormal EKG    . Allergy   . Anxiety    PTSD  . CHF (congestive heart failure) ([place])   . Depression   . Diabetes mellitus without complication ([place])   . Dyspnea   . GERD (gastroesophageal reflux disease)   . Hyperlipidemia   . Hypertension    Unspecified  . Kidney stone   . Seizures ([place])    Reports last seizure was 30 + years ago   . Sleep apnea    no CPAP worn  . Stroke [facility])     Past Surgical History:  Procedure Laterality Date  . HYSTEROSCOPY  2001   submucous myomectomy  . TOTAL ABDOMINAL HYSTERECTOMY  2002   TAH BSO  pathology showed leiomyoma, adenomyosis, simple and complex hyperplasia without atypia  . TUMOR REMOVAL     from left ear    Family History  Problem Relation Age of Onset  . Cancer Father        Prostate  . High blood pressure Father   . Coronary artery disease Paternal Grandfather   . Uterine cancer Mother   . Irritable bowel syndrome Mother   . Stroke Mother   . Osteoporosis Mother   . Heart disease Maternal Grandmother   . Heart attack Brother 58  . Cancer Brother        Prostate  . Colitis Neg Hx   . Esophageal cancer Neg Hx   . Stomach cancer Neg Hx   .  Rectal cancer Neg Hx     Social history:  reports that she has never smoked. She has never used smokeless tobacco. She reports that she does not drink alcohol or use drugs.  Medications:  Prior to Admission medications   Medication Sig Start Date End Date Taking? Authorizing Provider  ALPRAZolam Duanne Moron) 0.5 MG tablet Take 0.5 mg by mouth 2 (two) times daily as needed for anxiety.  05/19/14  Yes [provider]  aspirin 81 MG tablet Take 81 mg by mouth at bedtime.    Yes [provider]  bisoprolol-hydrochlorothiazide (ZIAC) 10-6.25 MG per tablet Take 1 tablet by mouth daily.  07/02/14  Yes [provider]  buPROPion (WELLBUTRIN SR) 150 MG 12 hr tablet Take 150 mg by mouth daily.   Yes [provider]  citalopram (CELEXA) 20 MG tablet Take 30 mg by mouth daily.   05/01/18  Yes [provider]  furosemide (LASIX) 20 MG tablet TAKE 1 TABLET AS NEEDED FOR SWELLING/WEIGHT GAIN Patient taking differently: Take 20 mg by mouth daily as needed for fluid.  09/27/18  Yes BranchAlphonse Guild, MD  gabapentin (NEURONTIN) 100 MG capsule Take 200 mg by mouth 2 (two) times daily.  12/19/17  Yes [provider]  glimepiride (AMARYL) 2 MG tablet Take 4 mg by mouth daily with breakfast.    Yes [provider]  lisinopril (PRINIVIL,ZESTRIL) 5 MG tablet Take 1 tablet (5 mg total) by mouth daily. 04/02/18  Yes Branch, Alphonse Guild, MD  meclizine (ANTIVERT) 12.5 MG tablet Take 1 tablet (12.5 mg total) by mouth 3 (three) times daily as needed for dizziness. 11/01/18  Yes Cardama, Grayce Sessions, MD  omeprazole (PRILOSEC) 20 MG capsule Take 20 mg by mouth at bedtime.  07/02/14  Yes [provider]  potassium chloride (K-DUR) 10 MEQ tablet Take 10-20 mEq by mouth See admin instructions. Take 2 tablets daily for 3 days (starting 10/31/18) then take 1 tablet daily PRN   Yes [provider]  pravastatin (PRAVACHOL) 20 MG tablet TAKE 1 TABLET BY MOUTH EVERY DAY IN THE EVENING Patient taking differently: Take 20 mg by mouth every evening.  10/02/18  Yes Branch, Alphonse Guild, MD  Semaglutide,0.25 or 0.5MG /DOS, (OZEMPIC, 0.25 OR 0.5 MG/DOSE,) 2 MG/1.5ML SOPN Inject 1.5 mLs into the skin every Wednesday.  08/14/18  Yes [provider]  verapamil (CALAN-SR) 120 MG CR tablet Take 120 mg by mouth at bedtime.  07/02/14  Yes [provider]      Allergies  Allergen Reactions  . Codeine Rash  . Doxycycline Rash  . Erythromycin Rash    ROS:  Out of a complete 14 system review of symptoms, the patient complains only of the following symptoms, and all other reviewed systems are negative.  Fatigue Chest pain Skin rash Blurred vision Shortness of breath, cough, snoring Memory loss, numbness, dizziness, seizures Depression, anxiety,  decreased energy, disinterest in activities, racing thoughts  Blood pressure (!) 150/75, pulse 72, height 5\' 1"  (1.549 m), weight 159 lb 5 oz (72.3 kg).  Physical Exam  General: The patient is alert and cooperative at the time of the examination.  The patient is moderately to markedly obese.  Eyes: Pupils are equal, round, and reactive to light. Discs are flat bilaterally.  Neck: The neck is supple, no carotid bruits are noted.  Respiratory: The respiratory examination is clear.  Cardiovascular: The cardiovascular examination reveals a regular rate and rhythm, no obvious murmurs or rubs are noted.  Skin: Extremities  are without significant edema.  Neurologic Exam  Mental status: The patient is alert and oriented x 3 at the time of the examination. The patient has apparent normal recent and remote memory, with an apparently normal attention span and concentration ability.  Cranial nerves: Facial symmetry is present. There is good sensation of the face to pinprick and soft touch bilaterally. The strength of the facial muscles and the muscles to head turning and shoulder shrug are normal bilaterally. Speech is well enunciated, no aphasia or dysarthria is noted. Extraocular movements are full. Visual fields are full. The tongue is midline, and the patient has symmetric elevation of the soft palate. No obvious hearing deficits are noted.  Motor: The motor testing reveals 5 over 5 strength of all 4 extremities. Good symmetric motor tone is noted throughout.  Sensory: Sensory testing is intact to pinprick, soft touch, vibration sensation, and position sense on all 4 extremities. No evidence of extinction is noted.  Coordination: Cerebellar testing reveals good finger-nose-finger and heel-to-shin bilaterally.  Gait and station: Gait is normal. Tandem gait is normal. Romberg is negative. No drift is seen.  Reflexes: Deep tendon reflexes are symmetric and normal bilaterally. Toes are downgoing  bilaterally.   Assessment/Plan:  1.  Episodes of diaphoresis, dizziness, near syncope  2.  Anxiety and depression  3.  Remote history of seizures  The patient likely is having panic attacks as an etiology for her episodes of dizziness and diaphoresis.  The patient is followed by Dr. Donata Clay from psychiatry.  The patient will be sent for a MRI of the brain given the results of the CT scan of the head that was done.  The patient will undergo an EEG study.  She will follow-up here in about 4 months.  More aggressive treatment for the panic disorder may need to be undertaken.  Jill Alexanders MD 11/14/2018 11:37 AM  [facility] [address] [place], [place] [address]  Phone [phone] Fax [phone]

## 2018-11-18 ENCOUNTER — Telehealth: Payer: Self-pay | Admitting: Neurology

## 2018-11-18 NOTE — Telephone Encounter (Signed)
lvm for pt to call back about scheduling [facility] Auth: [id number] (exp. 11/15/18 to 12/30/18)

## 2018-11-18 NOTE — Telephone Encounter (Signed)
Pt returned call. Please call as soon as available.

## 2018-11-18 NOTE — Telephone Encounter (Signed)
Spoke to the patient she would like the open MRI at [place]. they will reach out to the pt to schedule

## 2018-11-19 ENCOUNTER — Telehealth: Payer: Self-pay | Admitting: Neurology

## 2018-11-19 NOTE — Telephone Encounter (Signed)
Pt said she got up about 12am to use the bathroom and had another "spell" with bilateral feet dragging. Her husband gave her a xanax to help relax her, she did not get up again during the night after that. Today she has not had any problems with her feet.  FYI

## 2018-11-19 NOTE — Telephone Encounter (Signed)
Events noted, I am highly suspect that the events likely represent an anxiety event.  The patient is undergoing work-up with MRI of the brain and EEG evaluation.

## 2018-11-21 ENCOUNTER — Encounter: Payer: Self-pay | Admitting: Cardiology

## 2018-11-21 ENCOUNTER — Ambulatory Visit: Payer: 59 | Admitting: Cardiology

## 2018-11-21 ENCOUNTER — Other Ambulatory Visit: Payer: Self-pay

## 2018-11-21 VITALS — BP 120/74 | HR 64 | Ht 61.0 in | Wt 160.2 lb

## 2018-11-21 DIAGNOSIS — E782 Mixed hyperlipidemia: Secondary | ICD-10-CM | POA: Diagnosis not present

## 2018-11-21 DIAGNOSIS — I10 Essential (primary) hypertension: Secondary | ICD-10-CM | POA: Diagnosis not present

## 2018-11-21 DIAGNOSIS — I5032 Chronic diastolic (congestive) heart failure: Secondary | ICD-10-CM | POA: Diagnosis not present

## 2018-11-21 NOTE — Progress Notes (Signed)
Clinical Summary [name] [name] is a 58 y.o.female   1. Chronic diastolic HF - long history, evaluted by Lutricia Feil several years ago - 2005 normal coronaries. 2009 normal stress echo. 2014 stress echo negative.  - chronic ST/T changes on EKG - last visit reported symptoms of SOB and chest tightness.   02/2017 echo LVEF 65-70%, no WMAs, grade I diastolic dysfunction 05/3715 nuclear stress test no ischemia, exercised 4 minutes   - admitted Jan 2020 to [facility] with cough, congestion, SOB - CXR showed diffuse interstitial pattern. BNP 1226 - CT chest negative for PE. +ground glass opacity. Enlarged PA -  Managed for diastolic HF   - taking lasix just prn, has not required regular dosing.   2. Hyperlipidemia - she stopped lipitor due to leg pains in the past - tolerating pravastatin. - compliant with meds    3. HTN - she is compliant with meds     4. DIzziness/ weakness - admission 10/2018 with symptoms from pcp office. Thought related to low BG    SH: Husband is patient of mine, Medical laboratory scientific officer. Past Medical History:  Diagnosis Date  . Abnormal EKG   . Allergy   . Anxiety    PTSD  . CHF (congestive heart failure) ([place])   . Depression   . Diabetes mellitus without complication ([place])   . Dyspnea   . GERD (gastroesophageal reflux disease)   . Hyperlipidemia   . Hypertension    Unspecified  . Kidney stone   . Seizures ([place])    Reports last seizure was 30 + years ago   . Sleep apnea    no CPAP worn  . Stroke [facility])      Allergies  Allergen Reactions  . Codeine Rash  . Doxycycline Rash  . Erythromycin Rash     Current Outpatient Medications  Medication Sig Dispense Refill  . ALPRAZolam (XANAX) 0.5 MG tablet Take 0.5 mg by mouth 2 (two) times daily as needed for anxiety.   5  . aspirin 81 MG tablet Take 81 mg by mouth at bedtime.     . bisoprolol-hydrochlorothiazide (ZIAC) 10-6.25 MG per tablet Take 1 tablet by mouth daily.   12  .  buPROPion (WELLBUTRIN SR) 150 MG 12 hr tablet Take 150 mg by mouth daily.    . citalopram (CELEXA) 20 MG tablet Take 30 mg by mouth daily.   1  . furosemide (LASIX) 20 MG tablet TAKE 1 TABLET AS NEEDED FOR SWELLING/WEIGHT GAIN (Patient taking differently: Take 20 mg by mouth daily as needed for fluid. ) 30 tablet 1  . gabapentin (NEURONTIN) 100 MG capsule Take 200 mg by mouth 2 (two) times daily.   4  . glimepiride (AMARYL) 2 MG tablet Take 4 mg by mouth daily with breakfast.     . lisinopril (PRINIVIL,ZESTRIL) 5 MG tablet Take 1 tablet (5 mg total) by mouth daily. 90 tablet 3  . meclizine (ANTIVERT) 12.5 MG tablet Take 1 tablet (12.5 mg total) by mouth 3 (three) times daily as needed for dizziness. 30 tablet 0  . omeprazole (PRILOSEC) 20 MG capsule Take 20 mg by mouth at bedtime.   9  . potassium chloride (K-DUR) 10 MEQ tablet Take 10-20 mEq by mouth See admin instructions. Take 2 tablets daily for 3 days (starting 10/31/18) then take 1 tablet daily PRN    . pravastatin (PRAVACHOL) 20 MG tablet TAKE 1 TABLET BY MOUTH EVERY DAY IN THE EVENING (Patient taking differently: Take 20  mg by mouth every evening. ) 90 tablet 1  . Semaglutide,0.25 or 0.5MG /DOS, (OZEMPIC, 0.25 OR 0.5 MG/DOSE,) 2 MG/1.5ML SOPN Inject 1.5 mLs into the skin every Wednesday.     . verapamil (CALAN-SR) 120 MG CR tablet Take 120 mg by mouth at bedtime.   11   No current facility-administered medications for this visit.      Past Surgical History:  Procedure Laterality Date  . HYSTEROSCOPY  2001   submucous myomectomy  . TOTAL ABDOMINAL HYSTERECTOMY  2002   TAH BSO  pathology showed leiomyoma, adenomyosis, simple and complex hyperplasia without atypia  . TUMOR REMOVAL     from left ear     Allergies  Allergen Reactions  . Codeine Rash  . Doxycycline Rash  . Erythromycin Rash      Family History  Problem Relation Age of Onset  . Cancer Father        Prostate  . High blood pressure Father   . Coronary artery  disease Paternal Grandfather   . Uterine cancer Mother   . Irritable bowel syndrome Mother   . Stroke Mother   . Osteoporosis Mother   . Heart disease Maternal Grandmother   . Heart attack Brother 66  . Cancer Brother        Prostate  . Colitis Neg Hx   . Esophageal cancer Neg Hx   . Stomach cancer Neg Hx   . Rectal cancer Neg Hx      Social History [name] [name] reports that she has never smoked. She has never used smokeless tobacco. [name] [name] reports no history of alcohol use.   Review of Systems CONSTITUTIONAL: No weight loss, fever, chills, weakness or fatigue.  HEENT: Eyes: No visual loss, blurred vision, double vision or yellow sclerae.No hearing loss, sneezing, congestion, runny nose or sore throat.  SKIN: No rash or itching.  CARDIOVASCULAR: per hpi RESPIRATORY: No shortness of breath, cough or sputum.  GASTROINTESTINAL: No anorexia, nausea, vomiting or diarrhea. No abdominal pain or blood.  GENITOURINARY: No burning on urination, no polyuria NEUROLOGICAL: No headache, dizziness, syncope, paralysis, ataxia, numbness or tingling in the extremities. No change in bowel or bladder control.  MUSCULOSKELETAL: No muscle, back pain, joint pain or stiffness.  LYMPHATICS: No enlarged nodes. No history of splenectomy.  PSYCHIATRIC: No history of depression or anxiety.  ENDOCRINOLOGIC: No reports of sweating, cold or heat intolerance. No polyuria or polydipsia.  Marland Kitchen   Physical Examination Today's Vitals   11/21/18 1517  BP: 120/74  Pulse: 64  SpO2: 97%  Weight: 160 lb 3.2 oz (72.7 kg)  Height: 5\' 1"  (1.549 m)   Body mass index is 30.27 kg/m.  Gen: resting comfortably, no acute distress HEENT: no scleral icterus, pupils equal round and reactive, no palptable cervical adenopathy,  CV: RRR, no m/r/g, no jvd Resp: Clear to auscultation bilaterally GI: abdomen is soft, non-tender, non-distended, normal bowel sounds, no hepatosplenomegaly MSK: extremities are warm, no edema.   Skin: warm, no rash Neuro:  no focal deficits Psych: appropriate affect   Diagnostic Studies  02/2017 echo Study Conclusions  - Left ventricle: The cavity size was normal. Wall thickness was increased in a pattern of mild LVH. Systolic function was vigorous. The estimated ejection fraction was in the range of 65% to 70%. Wall motion was normal; there were no regional wall motion abnormalities. Doppler parameters are consistent with abnormal left ventricular relaxation (grade 1 diastolic dysfunction). - Aortic valve: Valve area (VTI): 2.32 cm^2. Valve area (Vmax): 2.19  cm^2. Valve area (Vmean): 2.18 cm^2. - Technically adequate study.  02/2017 echo  Blood pressure demonstrated a hypertensive response to exercise.  There was no ST segment deviation noted during stress.  The study is normal.  This is a low risk study.  Nuclear stress EF: 85%.  Assessment and Plan   1. Chronic diastolic HF - euvolemic without symptoms, continue current meds   2. HTN -she is at goal. Continue current meds   3. Hyperlipidemia - she will continue pravastatin.     F/u 6 months   Arnoldo Lenis, M.D.

## 2018-11-21 NOTE — Patient Instructions (Signed)

## 2018-11-25 ENCOUNTER — Ambulatory Visit: Payer: 59 | Admitting: Cardiology

## 2018-11-28 ENCOUNTER — Other Ambulatory Visit: Payer: 59

## 2018-12-04 ENCOUNTER — Telehealth: Payer: Self-pay | Admitting: Neurology

## 2018-12-04 NOTE — Telephone Encounter (Signed)
Pt called wanting to be seen sooner than her July visit. She is consenting to a telephone visit if she can be seen sooner due to a disability hearing that she has coming up. Please advise.

## 2018-12-04 NOTE — Telephone Encounter (Signed)
I spoke with the pt. She is agreeable to a telephone visit, but would like to wait until her EEG results are back. I advised I would call once EEG is resulted.

## 2018-12-05 ENCOUNTER — Ambulatory Visit: Payer: 59 | Admitting: Neurology

## 2018-12-05 ENCOUNTER — Encounter: Payer: Self-pay | Admitting: Neurology

## 2018-12-05 ENCOUNTER — Other Ambulatory Visit: Payer: Self-pay

## 2018-12-05 ENCOUNTER — Telehealth: Payer: Self-pay | Admitting: Neurology

## 2018-12-05 DIAGNOSIS — R42 Dizziness and giddiness: Secondary | ICD-10-CM

## 2018-12-05 DIAGNOSIS — G459 Transient cerebral ischemic attack, unspecified: Secondary | ICD-10-CM

## 2018-12-05 DIAGNOSIS — R4182 Altered mental status, unspecified: Secondary | ICD-10-CM

## 2018-12-05 NOTE — Telephone Encounter (Signed)
I called the patient.  EEG study was unremarkable.  MRI of the brain apparently was done at [facility] MRI, I have not yet received the report.  We will try to get the report tomorrow and call her.

## 2018-12-05 NOTE — Procedures (Signed)
    History:  [name] [name] is a 58 year old patient with a history of anxiety and depression and a history of diabetes.  The patient has had multiple episodes of diaphoresis, dizziness, cloudy headed sensations, then she will feel fainty and have difficulty walking.  The patient is being evaluated for these events.  This is a routine EEG.  No skull defects are noted.  Medications include Xanax, aspirin, Ziac, Wellbutrin, Celexa, Lasix, gabapentin, Amaryl, lisinopril, Antivert, Prilosec, potassium supplementation, Pravachol, and verapamil.  EEG classification: Normal awake  Description of the recording: The background rhythms of this recording consists of a fairly well modulated medium amplitude alpha rhythm of 8 Hz that is reactive to eye opening and closure. As the record progresses, the patient appears to remain in the waking state throughout the recording. Photic stimulation was performed, resulting in a bilateral and symmetric photic driving response. Hyperventilation was also performed, resulting in a minimal buildup of the background rhythm activities without significant slowing seen. At no time during the recording does there appear to be evidence of spike or spike wave discharges or evidence of focal slowing. EKG monitor shows no evidence of cardiac rhythm abnormalities with a heart rate of 66.  Impression: This is a normal EEG recording in the waking state. No evidence of ictal or interictal discharges are seen.

## 2018-12-06 ENCOUNTER — Telehealth: Payer: Self-pay | Admitting: Neurology

## 2018-12-06 DIAGNOSIS — I639 Cerebral infarction, unspecified: Secondary | ICD-10-CM

## 2018-12-06 NOTE — Telephone Encounter (Signed)
I called the patient.  The MRI of the brain suggest a possible small stroke event adjacent to the left corpus callosum area.  The patient is on aspirin, she checks her blood pressure frequently, she has recently had an echocardiogram of the heart.  I will get a carotid Doppler study done.  The patient is applying for disability, mainly because of her diabetes and anxiety and depression.  Her hearing for disability will be in June, and she will want her records available for this hearing.   MRI brain 11/20/18:  IMPRESSION: 1. No evidence of acute infarct.  2. Small 7.4 mm nonspecific lesion at the left aspect of the corpus callosum splenium. There is a broad differential diagnosis which could include a small subacute infarct, transient splenial lesion, as well as sequela of demyelination. Correlate  clinically, suggest a follow-up MRI brain with and without contrast in at least 4 to 6 weeks for further characterization/interval change.  3.  Small perivascular space versus old small infarct adjacent to the left basal ganglia.  4. Paranasal sinus secretions, of note a small air-fluid level in the left maxillary sinus, correlate for evidence of sinusitis.    2D echo 09/25/18:  Study Conclusions  - Left ventricle: The cavity size was normal. Wall thickness was   increased in a pattern of mild LVH. Systolic function was normal.   The estimated ejection fraction was in the range of 60% to 65%.   Wall motion was normal; there were no regional wall motion   abnormalities. Doppler parameters are consistent with abnormal   left ventricular relaxation (grade 1 diastolic dysfunction). - Aortic valve: Mildly calcified annulus. Trileaflet. - Mitral valve: Mildly calcified annulus. There was trivial   regurgitation. - Right atrium: Central venous pressure (est): 3 mm Hg. - Atrial septum: There was increased thickness of the septum,   consistent with lipomatous hypertrophy. - Tricuspid  valve: There was trivial regurgitation. - Pulmonary arteries: Systolic pressure could not be accurately   estimated. - Pericardium, extracardiac: A prominent pericardial fat pad was   present. Cannot exclude small to moderate sized, organized   circumferential pericardial effusion.

## 2018-12-06 NOTE — Telephone Encounter (Signed)
I was able to locate MRI report in care every.

## 2018-12-09 ENCOUNTER — Telehealth: Payer: Self-pay | Admitting: Neurology

## 2018-12-09 NOTE — Telephone Encounter (Signed)
Pt called she had another spell with weakness, hard to lift her legs on Saturday. She did lay down, checked sugar levels-it was ok, took xanax and slept for 3 hours. After that she felt ok. She said she does not need a return call.    FYI

## 2018-12-09 NOTE — Telephone Encounter (Signed)
Events noted, work-up is being undertaken, MRI and EEG study will be done.

## 2018-12-12 ENCOUNTER — Telehealth: Payer: Self-pay | Admitting: Neurology

## 2018-12-12 ENCOUNTER — Telehealth: Payer: Self-pay | Admitting: *Deleted

## 2018-12-12 NOTE — Telephone Encounter (Signed)
I emailed pt release form to fill out and e-mail back to gna.

## 2018-12-12 NOTE — Telephone Encounter (Signed)
Patient is needing a copy of her medical records sent to her psychiatrist . Noemi [facility] Telephone [phone].

## 2018-12-17 ENCOUNTER — Telehealth: Payer: Self-pay | Admitting: *Deleted

## 2018-12-17 NOTE — Telephone Encounter (Signed)
Pt has question about up coming appt. Please call [phone]

## 2018-12-17 NOTE — Telephone Encounter (Signed)
I spoke with the patient and she is following up on the recommendation from 11/20/18 to have a repeat MRI without contrast 4-6 weeks out.   I spoke with Astrid Divine about this and she states due to the patient's insurance guidelines the patient will not be eligible to have a repeat MRI until after 12/30/18 and may still require a peer to peer.   Patient is requesting a call back to verify if MRI is still recommended. I advised I would send message to MD to further review/advise. Pt was agreeable.

## 2018-12-17 NOTE — Telephone Encounter (Signed)
I called the patient.  The patient has been ordered for carotid Doppler study, this has not yet been done.  They may be holding off on scheduling the Doppler until they know when they can start scheduling.  The patient likely had a recent small vessel stroke event.  She does have an underlying anxiety disorder, I will send the medical records to Dr. Dorethea Clan.  The patient will follow-up here in the summer.

## 2018-12-18 NOTE — Telephone Encounter (Signed)
I have called and spoke to patient about her doppler. Hospital's are referring all patients out for Doppler's at this time to [facility] on [address]. Att Crystal . Patient is fine with going here . Crystal at [facility] has Dr. Jannifer Franklin contact number at the office if he is needed.   [facility] Tele[facility] [phone] - Fax [phone] . Thanks Hinton Dyer

## 2018-12-26 ENCOUNTER — Telehealth: Payer: Self-pay | Admitting: Neurology

## 2018-12-26 NOTE — Telephone Encounter (Signed)
The patient had another episode of feeling weak in the legs on both sides, she got better after she laid down and took a nap.  The patient required assistance to get back into the house, she was outside planting flowers.  She has not checked her blood pressure or pulse during these events, she does have a blood pressure cuff, she will check the blood pressure and pulse on the next episode.  The carotid Doppler study is pending.

## 2018-12-26 NOTE — Telephone Encounter (Signed)
Pt has called to make Dr Jannifer Franklin aware that she had another episode on Monday which is concerning to her.  Pt is asking for a call to discuss

## 2019-01-03 ENCOUNTER — Telehealth: Payer: Self-pay | Admitting: Cardiology

## 2019-01-03 MED ORDER — FUROSEMIDE 20 MG PO TABS: ORAL_TABLET | ORAL | 2 refills | Status: DC

## 2019-01-03 NOTE — Telephone Encounter (Signed)
Patient called to say she has gained 3 pounds

## 2019-01-03 NOTE — Telephone Encounter (Signed)
Take her lasix daily until weight comes back down to 156 lbs, update [place] again on Monday   Zandra Abts MD

## 2019-01-03 NOTE — Telephone Encounter (Signed)
Patient stated that she was told to call Dr. Harl Bowie if she has 3 pound weight gain.  Stated she has been weighing at home 156lb.  Today was 159lb.  No c/o chest pain, dizziness, or SOB.  Does note some swelling in her right ankle that is new for her.  She does have Lasix to use as needed for weight gain.  She has not taken anything today.  Advised her to go ahead and take one tab of her Lasix 20mg  today.  Refill sent to [facility] today at patient request.  Message fwd to provider for any further suggestions.

## 2019-01-03 NOTE — Telephone Encounter (Signed)
Left message to return call 

## 2019-01-03 NOTE — Telephone Encounter (Signed)
Patient verbalized understanding of dr.Branch's lasix orders, she will call back 01/06/2019

## 2019-01-06 NOTE — Telephone Encounter (Addendum)
Weight Friday was 159 lbs and today its 158 lbs. Taking furosemide 20 mg & potassium 10 meq daily.

## 2019-01-06 NOTE — Telephone Encounter (Signed)
Take lasix 40mg  today and tomorrow, update [place] again on Wed   J Myleigh Amara MD

## 2019-01-06 NOTE — Telephone Encounter (Signed)
Patient informed and verbalized understanding of plan. 

## 2019-01-06 NOTE — Telephone Encounter (Signed)
Pt called wanting to give updated weights and to speak w/ nurse.

## 2019-01-08 ENCOUNTER — Telehealth: Payer: Self-pay | Admitting: Cardiology

## 2019-01-08 ENCOUNTER — Encounter: Payer: Self-pay | Admitting: *Deleted

## 2019-01-08 NOTE — Telephone Encounter (Signed)
Patient called to report that her weight went down to 157.  [phone])

## 2019-01-08 NOTE — Telephone Encounter (Signed)
Pt weight at 157lbs today - denies any swelling/symptoms at this time. Pt aware of BP reading results reviewed by Dr Harl Bowie and that he will not make any changes to meds at this time. Will request pt labs from pcp

## 2019-01-09 ENCOUNTER — Telehealth: Payer: Self-pay | Admitting: Cardiology

## 2019-01-09 MED ORDER — FUROSEMIDE 20 MG PO TABS: ORAL_TABLET | ORAL | 3 refills | Status: DC

## 2019-01-09 NOTE — Telephone Encounter (Signed)
Patient notified and will take lasix 20 mg daily, may take an extra 20 mg if weight is over 159 lbs, or, significant swelling

## 2019-01-09 NOTE — Telephone Encounter (Signed)
Forwarded to [employer]

## 2019-01-09 NOTE — Telephone Encounter (Signed)
Ok to go back to her lasix 20mg  daily, can take 40mg  prn weight 159 or higher or for significant swelling   J BrancH MD

## 2019-01-09 NOTE — Telephone Encounter (Signed)
Patient called to report her weight for 01/09/2019    157 lbs.

## 2019-01-14 ENCOUNTER — Telehealth: Payer: Self-pay | Admitting: Cardiology

## 2019-01-14 NOTE — Telephone Encounter (Signed)
Pt wanted to confirm she could take 40 mg of lasix a few days as needed for weight gain and swelling - says she is at 159lbs today and her hands are swollen - aware that she could take 40 mg of lasix for a few days and monitor weight, if weight comes back down to baseline go back to 20 mg daily and monitor weight, if weight increases or doesn't come off or develops symptoms she needs to call us back - pt voiced understanding

## 2019-01-14 NOTE — Telephone Encounter (Signed)
Patient called stating that she needs to speak with someone in regards to her medication.  [phone])

## 2019-01-15 ENCOUNTER — Telehealth (HOSPITAL_COMMUNITY): Payer: Self-pay | Admitting: Rehabilitation

## 2019-01-15 ENCOUNTER — Telehealth: Payer: Self-pay | Admitting: Cardiology

## 2019-01-15 DIAGNOSIS — I10 Essential (primary) hypertension: Secondary | ICD-10-CM

## 2019-01-15 NOTE — Telephone Encounter (Signed)
The above patient or their representative was contacted and gave the following answers to these questions:         Do you have any of the following symptoms?  Pt states she is having some shortness of breath but it is related to her CHF. She is awaiting a call back from her cardiologist in regards to lasix.   Fever                    Cough                   Shortness of breath  Do  you have any of the following other symptoms? No   muscle pain         vomiting,        diarrhea        rash         weakness        red eye        abdominal pain         bruising          bruising or bleeding              joint pain           severe headache    Have you been in contact with someone who was or has been sick in the past 2 weeks? No  Yes                 Unsure                         Unable to assess   Does the person that you were in contact with have any of the following symptoms?   Cough         shortness of breath           muscle pain         vomiting,            diarrhea            rash            weakness           fever            red eye           abdominal pain           bruising  or  bleeding                joint pain                severe headache               Have you  or someone you have been in contact with traveled internationally in th last month? No        If yes, which countries?   Have you  or someone you have been in contact with traveled outside [place] in th last month? No         If yes, which state and city?   COMMENTS OR ACTION PLAN FOR THIS PATIENT:  Pt instructed to wear mask to appt

## 2019-01-15 NOTE — Telephone Encounter (Signed)
Pt voiced understanding - says she has potassium and has been take 26meq but will increase to 20 meq daily - has taken 40 mg of lasix this morning and will take another 40 mg this evening and continue for next 3 days. pt will have labs done at [facility] on Friday and update [place] on weight Friday afternoon. Orders placed.

## 2019-01-15 NOTE — Telephone Encounter (Signed)
Pt c/o SOB starting this morning. BP was 145/85 - took 40 mg of lasix yesterday and 40 mg this morning and weight is still at 159lbs. Has some swelling in her hands/feet/ankles and has been tired when doing normal activities. Will forward to covering provider

## 2019-01-15 NOTE — Telephone Encounter (Signed)
Having issues with SOB harder when she lays down- hurts under her breast 145/85    Weight  159

## 2019-01-15 NOTE — Telephone Encounter (Signed)
Increase Lasix to 40 mg bid x 3 days and have her call us back for an update. Make sure she's taking KCl 20 meq daily. Check BMET on Friday.

## 2019-01-16 ENCOUNTER — Ambulatory Visit (HOSPITAL_COMMUNITY)
Admission: RE | Admit: 2019-01-16 | Discharge: 2019-01-16 | Disposition: A | Discharge status: Home / Self Care | Payer: 59 | Source: Ambulatory Visit | Attending: Family | Admitting: Family

## 2019-01-16 ENCOUNTER — Telehealth: Payer: Self-pay | Admitting: Neurology

## 2019-01-16 ENCOUNTER — Other Ambulatory Visit: Payer: Self-pay

## 2019-01-16 DIAGNOSIS — I639 Cerebral infarction, unspecified: Secondary | ICD-10-CM | POA: Diagnosis present

## 2019-01-16 DIAGNOSIS — R9089 Other abnormal findings on diagnostic imaging of central nervous system: Secondary | ICD-10-CM

## 2019-01-16 NOTE — Telephone Encounter (Signed)
I called the patient.  The carotid Doppler study is unremarkable.  The prior MRI showed a possible subacute stroke event, it was recommended that a MRI of the brain with and without gadolinium should be done, I will go ahead and order this at this time.    Carotid doppler 01/16/19:  Summary: Right Carotid: Velocities in the right ICA are consistent with a 1-39% stenosis.  Left Carotid: Velocities in the left ICA are consistent with a 1-39% stenosis.  Vertebrals:  Bilateral vertebral arteries demonstrate antegrade flow. Subclavians: Normal flow hemodynamics were seen in bilateral subclavian              arteries.

## 2019-01-17 ENCOUNTER — Other Ambulatory Visit (HOSPITAL_COMMUNITY)
Admission: RE | Admit: 2019-01-17 | Discharge: 2019-01-17 | Disposition: A | Discharge status: Home / Self Care | Payer: 59 | Source: Ambulatory Visit | Attending: Cardiology | Admitting: Cardiology

## 2019-01-17 ENCOUNTER — Telehealth: Payer: Self-pay | Admitting: Cardiology

## 2019-01-17 ENCOUNTER — Other Ambulatory Visit: Payer: Self-pay

## 2019-01-17 DIAGNOSIS — I10 Essential (primary) hypertension: Secondary | ICD-10-CM | POA: Diagnosis not present

## 2019-01-17 DIAGNOSIS — I5032 Chronic diastolic (congestive) heart failure: Secondary | ICD-10-CM

## 2019-01-17 LAB — BASIC METABOLIC PANEL
Anion gap: 12 (ref 5–15)
BUN: 23 mg/dL — ABNORMAL HIGH (ref 6–20)
CO2: 28 mmol/L (ref 22–32)
Calcium: 9.3 mg/dL (ref 8.9–10.3)
Chloride: 98 mmol/L (ref 98–111)
Creatinine, Ser: 0.96 mg/dL (ref 0.44–1.00)
GFR calc Af Amer: >60 mL/min (ref >60)
GFR calc non Af Amer: >60 mL/min (ref >60)
Glucose, Bld: 157 mg/dL — ABNORMAL HIGH (ref 70–99)
Potassium: 3.4 mmol/L — ABNORMAL LOW (ref 3.5–5.1)
Sodium: 138 mmol/L (ref 135–145)

## 2019-01-17 NOTE — Telephone Encounter (Signed)
Patient informed and verbalized understanding of plan. Copy sent to PCP 

## 2019-01-17 NOTE — Telephone Encounter (Signed)
Pt's weight is back down to normal, 156lbs this morning. \  Please give pt a call concerning her BP readings [phone]

## 2019-01-17 NOTE — Telephone Encounter (Signed)
Needs to know if she is to keep taking lasix  And she had her labs done

## 2019-01-17 NOTE — Addendum Note (Signed)
Addended by: Merlene Laughter on: 01/17/2019 04:05 PM   Modules accepted: Orders

## 2019-01-17 NOTE — Telephone Encounter (Signed)
Glad to hear weight has come back down.

## 2019-01-17 NOTE — Telephone Encounter (Signed)
-----   Message from Merlene Laughter, RN sent at 01/17/2019  3:32 PM EDT -----  ----- Message ----- From: Herminio Commons, MD Sent: 01/17/2019   1:26 PM EDT To: Massie Maroon, CMA  K mildly low. Increase KCl to 40 meq daily x 5 days then back to 20 daily. Repeat BMET on Wednesday.

## 2019-01-20 ENCOUNTER — Telehealth: Payer: Self-pay | Admitting: Neurology

## 2019-01-20 NOTE — Telephone Encounter (Signed)
I called the patient.  The patient had her typical spell where she gets anxious and nervous and and feels weak all over and cannot walk.  She took her Xanax which was the right thing to do, she is laying down resting.  A repeat MRI of the brain has been ordered.  The above episodes are likely related to anxiety events.

## 2019-01-20 NOTE — Telephone Encounter (Signed)
Pt stated she had another spell with her legs going weak and shes walking as if she had a stroke, she states she was advised before to take bp after and provide reading , so she did it this time and her bp is reading 146/75

## 2019-01-22 ENCOUNTER — Telehealth: Payer: Self-pay | Admitting: Neurology

## 2019-01-22 ENCOUNTER — Other Ambulatory Visit (HOSPITAL_COMMUNITY)
Admission: RE | Admit: 2019-01-22 | Discharge: 2019-01-22 | Disposition: A | Discharge status: Home / Self Care | Payer: 59 | Source: Ambulatory Visit | Attending: Cardiovascular Disease | Admitting: Cardiovascular Disease

## 2019-01-22 DIAGNOSIS — I5032 Chronic diastolic (congestive) heart failure: Secondary | ICD-10-CM | POA: Diagnosis not present

## 2019-01-22 DIAGNOSIS — I10 Essential (primary) hypertension: Secondary | ICD-10-CM | POA: Insufficient documentation

## 2019-01-22 LAB — BASIC METABOLIC PANEL
Anion gap: 12 (ref 5–15)
BUN: 21 mg/dL — ABNORMAL HIGH (ref 6–20)
CO2: 25 mmol/L (ref 22–32)
Calcium: 9.7 mg/dL (ref 8.9–10.3)
Chloride: 101 mmol/L (ref 98–111)
Creatinine, Ser: 0.71 mg/dL (ref 0.44–1.00)
GFR calc Af Amer: >60 mL/min (ref >60)
GFR calc non Af Amer: >60 mL/min (ref >60)
Glucose, Bld: 148 mg/dL — ABNORMAL HIGH (ref 70–99)
Potassium: 3.6 mmol/L (ref 3.5–5.1)
Sodium: 138 mmol/L (ref 135–145)

## 2019-01-22 NOTE — Telephone Encounter (Signed)
no to the covid-19 questions MR Brain w/wo contrast Dr. Jannifer Franklin [facility] Auth: [id number] (exp. 01/22/19 to 03/08/19). Patient is scheduled at [facility] for 01/28/19

## 2019-01-28 ENCOUNTER — Telehealth: Payer: Self-pay

## 2019-01-28 ENCOUNTER — Other Ambulatory Visit: Payer: Self-pay

## 2019-01-28 ENCOUNTER — Ambulatory Visit (INDEPENDENT_AMBULATORY_CARE_PROVIDER_SITE_OTHER): Payer: 59

## 2019-01-28 DIAGNOSIS — R9089 Other abnormal findings on diagnostic imaging of central nervous system: Secondary | ICD-10-CM

## 2019-01-28 MED ORDER — GADOBENATE DIMEGLUMINE 529 MG/ML IV SOLN
15.0000 mL | Freq: Once | INTRAVENOUS | Status: AC | PRN
  Administered 2019-01-28: 15 mL via INTRAVENOUS

## 2019-01-28 NOTE — Telephone Encounter (Signed)
Patient reported to the clinic on 01/28/19 for MRI appointment. Patient has a prescription for Xanax 0.5 mg 2 daily as needed and stated she needed her medication to help calm her nerves for appt. Patient states she took 1 xanax around 10 30 am and wanted to know when she could take her second dosage. Patient confirmed her MRI appointment time is 12 pm and was advised she could take the second dosage 15 mins prior (1145 pm) Patient verbalized understanding and confirmed she would not be operating a vehicle after her MRI appointment. Pt's husband had brought her to the appt.

## 2019-01-29 ENCOUNTER — Telehealth: Payer: Self-pay | Admitting: Neurology

## 2019-01-29 NOTE — Telephone Encounter (Signed)
MRI evaluation was normal, no brain lesions were seen.  The patient will follow-up in July, she has not had any recent episodes.  Her Celexa was recently increased to 40 mg daily, the episodes may represent panic events.   MRI brain 01/29/19:  IMPRESSION:   Normal MRI brain (with and without).

## 2019-01-31 ENCOUNTER — Telehealth: Payer: Self-pay | Admitting: Cardiology

## 2019-01-31 DIAGNOSIS — I5032 Chronic diastolic (congestive) heart failure: Secondary | ICD-10-CM

## 2019-01-31 NOTE — Telephone Encounter (Signed)
Patient notified.  She will call back with update on Tuesday.

## 2019-01-31 NOTE — Telephone Encounter (Signed)
Pt called back again concerning weight gain.

## 2019-01-31 NOTE — Telephone Encounter (Signed)
patient's weight is  162        After taking 2 lasix and 2 potasium a day

## 2019-01-31 NOTE — Telephone Encounter (Signed)
Weight is going back up. Clarify by 2 lasix a day she means 2 of the 20mg  tablets (40mg  daily). If so over the holiday weekend take 40mg  bid and update [place] on Tuesday   J Jaxin Fulfer MD

## 2019-02-05 NOTE — Telephone Encounter (Signed)
Patient notified & verbalized understanding.  Stated she was not sure if she was just gaining weight or if it is really fluid.  Informed her that a good gauge after she is back to baseline would be weight gain of 3 lbs in 24 hour time frame or 5 lbs in 1 week.  She will do labs at [facility] in 2 weeks.

## 2019-02-05 NOTE — Telephone Encounter (Signed)
Continue lasix 40mg  bid until weight is 159 lbs or less, once there can go to 40mg  once a day and going forward if weight above 159 lbs go back to taking 40mg  bid until back down again. Check BMET/Mg in 2 weeks    Zandra Abts MD

## 2019-02-05 NOTE — Addendum Note (Signed)
Addended by: Laurine Blazer on: 02/05/2019 03:22 PM   Modules accepted: Orders

## 2019-02-05 NOTE — Telephone Encounter (Signed)
Pt's weight this morning is 161lbs this morning, she's been taking the 2 lasix in the morning w/ the potassium and 2 at night

## 2019-02-14 ENCOUNTER — Telehealth: Payer: Self-pay | Admitting: Neurology

## 2019-02-14 NOTE — Telephone Encounter (Signed)
I have called her husband, patient had panic attack, body shaking, really upset  MRI brain on Jan 29 2019 was normal.  I have advised him continue observing her, she was given xanax already, if she remains symptomatic,  he may bring her to ER,

## 2019-02-14 NOTE — Telephone Encounter (Signed)
Pt called stating she is on her way to the hospital due to her possibly just having a stroke. Please advise.

## 2019-02-17 NOTE — Telephone Encounter (Signed)
Pt has called back asking this message be forwarded to Dr Jannifer Franklin.  Pt said that on Friday she had an anxiety attack that got worse.  Pt said she went numb, her head for the 1st time began to jerk,  Pt said her feet began dragging like someone who recently had a stroke.  Pt said she was told by Dr Krista Blue not to go to the ED due to the virus.  Please call

## 2019-02-17 NOTE — Telephone Encounter (Signed)
I called the patient.  The patient had another event of numbness, she also had some head jerking to the left with the most recent event.  The episode was brought on by talking to a disability attorney, the patient was being asked questions, she started getting nervous and anxious, she started crying, then she started getting numb all over and then went into her typical episode.  These events likely represent a form of panic attack.  The patient takes alprazolam in the morning and then may take alprazolam later if needed.  The patient may need to go on a scheduled dose of a long-acting benzodiazepine such as clonazepam.  The patient is followed by Dr. Magda Paganini in regards to this.

## 2019-02-19 ENCOUNTER — Other Ambulatory Visit (HOSPITAL_COMMUNITY)
Admission: RE | Admit: 2019-02-19 | Discharge: 2019-02-19 | Disposition: A | Discharge status: Home / Self Care | Payer: 59 | Source: Ambulatory Visit | Attending: Cardiovascular Disease | Admitting: Cardiovascular Disease

## 2019-02-19 DIAGNOSIS — I110 Hypertensive heart disease with heart failure: Secondary | ICD-10-CM | POA: Diagnosis present

## 2019-02-19 DIAGNOSIS — I5032 Chronic diastolic (congestive) heart failure: Secondary | ICD-10-CM | POA: Diagnosis not present

## 2019-02-19 LAB — BASIC METABOLIC PANEL
Anion gap: 13 (ref 5–15)
BUN: 18 mg/dL (ref 6–20)
CO2: 27 mmol/L (ref 22–32)
Calcium: 9.5 mg/dL (ref 8.9–10.3)
Chloride: 100 mmol/L (ref 98–111)
Creatinine, Ser: 0.78 mg/dL (ref 0.44–1.00)
GFR calc Af Amer: >60 mL/min (ref >60)
GFR calc non Af Amer: >60 mL/min (ref >60)
Glucose, Bld: 206 mg/dL — ABNORMAL HIGH (ref 70–99)
Potassium: 3.4 mmol/L — ABNORMAL LOW (ref 3.5–5.1)
Sodium: 140 mmol/L (ref 135–145)

## 2019-02-21 ENCOUNTER — Telehealth: Payer: Self-pay | Admitting: *Deleted

## 2019-02-21 MED ORDER — POTASSIUM CHLORIDE CRYS ER 20 MEQ PO TBCR: 40.0000 meq | EXTENDED_RELEASE_TABLET | Freq: Every day | ORAL | 1 refills | Status: DC

## 2019-02-21 NOTE — Telephone Encounter (Signed)
Pt voiced understanding - new rx sent to [facility] as requested - when does this pt need f/u ?

## 2019-02-21 NOTE — Telephone Encounter (Signed)
-----   Message from Arnoldo Lenis, MD sent at 02/21/2019  3:04 PM EDT ----- Potassium a little low. Clarify if taking KCl20 mEq daily, if so would increase to 106mEq daily  Zandra Abts MD

## 2019-02-24 NOTE — Telephone Encounter (Signed)
6 weeks please   Zandra Abts MD

## 2019-02-25 NOTE — Telephone Encounter (Signed)
Your next appt is September - do you wan this pt to be virtual or is September ok?

## 2019-02-27 NOTE — Telephone Encounter (Signed)
03/24/19 scheduled with Dr Harl Bowie virtual. Pt verbalized consent for appt.

## 2019-02-27 NOTE — Telephone Encounter (Signed)
Virtual is fine  Zandra Abts MD

## 2019-02-28 ENCOUNTER — Telehealth: Payer: Self-pay | Admitting: Cardiology

## 2019-02-28 NOTE — Telephone Encounter (Signed)
error 

## 2019-02-28 NOTE — Telephone Encounter (Signed)
Reports last night around 7:30 pm she started feeling drained and worn out and she went to bed at 8:30 pm. Today she feels tired with no energy. Weight 160 lbs this morning and says her baseline is 160-161 lbs. Confirmed taking K+ 40 meq and lasix 40 mg daily. Denies dizziness, chest pain, palpitations, sob, fever, chills or body aches. Has not taken BP or HR but was taken while on phone BP 129/73 & HR 81. Advised to contact PCP and message would be sent to her provider for recommendations. Verbalized understanding.

## 2019-02-28 NOTE — Telephone Encounter (Signed)
Patient called stating that she is very fatigue.States that she she is having to take extra Lasix and Potassium.

## 2019-03-03 NOTE — Telephone Encounter (Signed)
Pretty generalized symptoms and not specifically cardiac, I agree with her being evaluated by pcp. If they find something cardiac then we can see   J BrancH MD

## 2019-03-20 ENCOUNTER — Telehealth (INDEPENDENT_AMBULATORY_CARE_PROVIDER_SITE_OTHER): Payer: 59 | Admitting: Neurology

## 2019-03-20 ENCOUNTER — Encounter: Payer: Self-pay | Admitting: Neurology

## 2019-03-20 DIAGNOSIS — F410 Panic disorder [episodic paroxysmal anxiety] without agoraphobia: Secondary | ICD-10-CM | POA: Diagnosis not present

## 2019-03-20 HISTORY — DX: Panic disorder (episodic paroxysmal anxiety): F41.0

## 2019-03-20 NOTE — Progress Notes (Signed)
      Virtual Visit via Video Note  I connected with [name] [name] on 03/20/19 at 11:30 AM EDT by a video enabled telemedicine application and verified that I am speaking with the correct person using two identifiers.  Location: Patient: The patient is at home. Provider: Physician in office.   I discussed the limitations of evaluation and management by telemedicine and the availability of in person appointments. The patient expressed understanding and agreed to proceed.  History of Present Illness: [name] [name] is a 58 year old right-handed white female with a history of an underlying panic disorder.  The patient is having episodes of numbness with occasional head jerking, she may have shortness of breath and feels numb all over.  The patient generally has events when she is thinking about something that is upsetting to her or if she when she is in a stressful environment.  She is felt to have a panic disorder.  MRI of the brain done recently was normal, EEG study was normal.  A carotid Doppler study was normal.  She has had a recent increase in her gabapentin taken 400 mg twice daily and her Xanax it has been increased to the 1 mg tablets taking 1 in the morning and up to 3 times a day if needed.  The patient last had an event on 24 February 2019 when she was on the phone with her disability judge.  The patient has gotten disability.  She feels much less stressed currently, she is sleeping well at night.   MRI brain 01/29/19:  IMPRESSION:   Normal MRI brain (with and without).  * MRI scan images were reviewed online. I agree with the written report.    Observations/Objective: The visit was set up as a video visit, could not be completed as the patient could not connect.  Patient appeared to be alert and cooperative, speech is well enunciated, not aphasic or dysarthric.  Assessment and Plan: 1.  Panic disorder with frequent panic attacks  Neurologic work-up was unremarkable, no further  work-up is indicated.  The patient is under psychiatric care and seems to be doing better with medication adjustments.  Follow Up Instructions: The patient will follow-up here if needed.   I discussed the assessment and treatment plan with the patient. The patient was provided an opportunity to ask questions and all were answered. The patient agreed with the plan and demonstrated an understanding of the instructions.   The patient was advised to call back or seek an in-person evaluation if the symptoms worsen or if the condition fails to improve as anticipated.  I provided 15 minutes of non-face-to-face time during this encounter.   Kathrynn Ducking, MD

## 2019-03-22 ENCOUNTER — Telehealth: Payer: Self-pay | Admitting: Physician Assistant

## 2019-03-22 NOTE — Telephone Encounter (Signed)
[name] [name] is a 57 y.o. female with diastolic CHF.  She is being treated with Cipro for suspected diverticulitis.  Her BP has been in the 90s.  Her PCP asked her to hold her Lasix.  She called to make sure she could take the Cipro. I reviewed her last few ECGs.  She does not have any recent ECGs with prolonged QTc.  Her EF has been normal.  PLAN:  1. I advised her that she should be fine to take the Cipro. 2. I advised her to hold her Ziac and Lisinopril for 1 day, then resume as long as her BP is better. 3. I advised her to watch for swelling or sudden weight gain while she is holding some of her medications. Richardson Dopp, PA-C    03/22/2019 1:51 PM

## 2019-03-24 ENCOUNTER — Encounter: Payer: Self-pay | Admitting: Cardiology

## 2019-03-24 ENCOUNTER — Telehealth (INDEPENDENT_AMBULATORY_CARE_PROVIDER_SITE_OTHER): Payer: 59 | Admitting: Cardiology

## 2019-03-24 VITALS — Ht 61.0 in | Wt 162.0 lb

## 2019-03-24 DIAGNOSIS — E861 Hypovolemia: Secondary | ICD-10-CM

## 2019-03-24 DIAGNOSIS — I5032 Chronic diastolic (congestive) heart failure: Secondary | ICD-10-CM | POA: Diagnosis not present

## 2019-03-24 DIAGNOSIS — I9589 Other hypotension: Secondary | ICD-10-CM | POA: Diagnosis not present

## 2019-03-24 NOTE — Patient Instructions (Addendum)
Medication Instructions:   Hold your Lasix, Ziac, and Lisinopril.  Continue all other medications.    Labwork: none  Testing/Procedures: none  Follow-Up: 3 months   Any Other Special Instructions Will Be Listed Below (If Applicable). Call the office on Thursday to update provider on weights and blood pressure.    If you need a refill on your cardiac medications before your next appointment, please call your pharmacy.

## 2019-03-24 NOTE — Progress Notes (Signed)
Virtual Visit via Telephone Note   This visit type was conducted due to national recommendations for restrictions regarding the COVID-19 Pandemic (e.g. social distancing) in an effort to limit this patient's exposure and mitigate transmission in our community.  Due to her co-morbid illnesses, this patient is at least at moderate risk for complications without adequate follow up.  This format is felt to be most appropriate for this patient at this time.  The patient did not have access to video technology/had technical difficulties with video requiring transitioning to audio format only (telephone).  All issues noted in this document were discussed and addressed.  No physical exam could be performed with this format.  Please refer to the patient's chart for her  consent to telehealth for [facility].   Date:  03/24/2019   ID:  [name] [name], DOB Jun 17, 1961, MRN [id number]  Patient Location: Home Provider Location: Home  PCP:  Glenda Chroman, MD  Cardiologist:  Carlyle Dolly, MD  Electrophysiologist:  None   Evaluation Performed:  Follow-Up Visit  Chief Complaint:  Low blood pressure  History of Present Illness:    [name] [name] is a 58 y.o. female seen today for follow up of the following medical problems.   1. Chronic diastolic HF - long history, evaluted by Lutricia Feil several years ago - 2005 normal coronaries. 2009 normal stress echo. 2014 stress echo negative.  - chronic ST/T changes on EKG  02/2017 echo LVEF 65-70%, no WMAs, grade I diastolic dysfunction 10/7251 nuclear stress test no ischemia, exercised 4 minutes Jan 2020 echo LVEF 60-65%, no WMAs, grade I diastolic dysfunction   - weights variable over time. Most recently 156 to 160 range - recent issues with diarrhea has, had some low bp's and has held her diuretic.    2. Diverticulitis - adbomdinal pain and diarrhea. Frequent water stools.  - started on cipro    3. Low bp - recent issues with diagnosis with  diverticultitis, down to SBP is 90s - today SBP 90/60. Took lisinopril, verapamil, ziac. HOlding lasix today. Weight 162 lbs, no recent edema.Marland Kitchen Has had some palpitations.      SH: Husband is patient of mine, Medical laboratory scientific officer.    The patient does not have symptoms concerning for COVID-19 infection (fever, chills, cough, or new shortness of breath).    Past Medical History:  Diagnosis Date  . Abnormal EKG   . Allergy   . Anxiety    PTSD  . CHF (congestive heart failure) ([place])   . Depression   . Diabetes mellitus without complication ([place])   . Dyspnea   . GERD (gastroesophageal reflux disease)   . Hyperlipidemia   . Hypertension    Unspecified  . Kidney stone   . Panic disorder 03/20/2019  . Seizures ([place])    Reports last seizure was 30 + years ago   . Sleep apnea    no CPAP worn  . Stroke [facility])    Past Surgical History:  Procedure Laterality Date  . HYSTEROSCOPY  2001   submucous myomectomy  . TOTAL ABDOMINAL HYSTERECTOMY  2002   TAH BSO  pathology showed leiomyoma, adenomyosis, simple and complex hyperplasia without atypia  . TUMOR REMOVAL     from left ear     Current Meds  Medication Sig  . ALPRAZolam (XANAX) 1 MG tablet Take 1 mg by mouth 3 (three) times daily.  Marland Kitchen aspirin 81 MG tablet Take 81 mg by mouth at bedtime.   . bisoprolol-hydrochlorothiazide (  ZIAC) 10-6.25 MG per tablet Take 1 tablet by mouth daily.   Marland Kitchen buPROPion (WELLBUTRIN SR) 150 MG 12 hr tablet Take 150 mg by mouth daily.  . citalopram (CELEXA) 40 MG tablet Take 40 mg by mouth daily.  . furosemide (LASIX) 20 MG tablet Take 20 mg daily, may take an extra 20 mg if weight is over 159 lbs , or, for significant swelling  . gabapentin (NEURONTIN) 400 MG capsule Take 400 mg by mouth 2 (two) times daily.  Marland Kitchen glimepiride (AMARYL) 2 MG tablet Take 4 mg by mouth daily with breakfast.   . lisinopril (PRINIVIL,ZESTRIL) 5 MG tablet Take 1 tablet (5 mg total) by mouth daily.  Marland Kitchen omeprazole (PRILOSEC) 20 MG capsule Take  20 mg by mouth at bedtime.   . potassium chloride SA (K-DUR) 20 MEQ tablet Take 2 tablets (40 mEq total) by mouth daily.  . pravastatin (PRAVACHOL) 20 MG tablet TAKE 1 TABLET BY MOUTH EVERY DAY IN THE EVENING (Patient taking differently: Take 20 mg by mouth every evening. )  . Semaglutide,0.25 or 0.5MG /DOS, (OZEMPIC, 0.25 OR 0.5 MG/DOSE,) 2 MG/1.5ML SOPN Inject 2.5 mLs into the skin every Wednesday.   . verapamil (CALAN-SR) 120 MG CR tablet Take 120 mg by mouth at bedtime.   . [DISCONTINUED] gabapentin (NEURONTIN) 100 MG capsule Take 200 mg by mouth 2 (two) times daily.      Allergies:   Codeine, Doxycycline, and Erythromycin   Social History   Tobacco Use  . Smoking status: Never Smoker  . Smokeless tobacco: Never Used  Substance Use Topics  . Alcohol use: No    Alcohol/week: 0.0 standard drinks  . Drug use: No     Family Hx: The patient's family history includes Cancer in her brother and father; Coronary artery disease in her paternal grandfather; Heart attack (age of onset: 36) in her brother; Heart disease in her maternal grandmother; High blood pressure in her father; Irritable bowel syndrome in her mother; Osteoporosis in her mother; Stroke in her mother; Uterine cancer in her mother. There is no history of Colitis, Esophageal cancer, Stomach cancer, or Rectal cancer.  ROS:   Please see the history of present illness.     All other systems reviewed and are negative.   Prior CV studies:   The following studies were reviewed today:  02/2017 echo Study Conclusions  - Left ventricle: The cavity size was normal. Wall thickness was increased in a pattern of mild LVH. Systolic function was vigorous. The estimated ejection fraction was in the range of 65% to 70%. Wall motion was normal; there were no regional wall motion abnormalities. Doppler parameters are consistent with abnormal left ventricular relaxation (grade 1 diastolic dysfunction). - Aortic valve: Valve  area (VTI): 2.32 cm^2. Valve area (Vmax): 2.19 cm^2. Valve area (Vmean): 2.18 cm^2. - Technically adequate study.    02/2017 echo  Blood pressure demonstrated a hypertensive response to exercise.  There was no ST segment deviation noted during stress.  The study is normal.  This is a low risk study.  Nuclear stress EF: 85%.   Labs/Other Tests and Data Reviewed:    EKG:  n/a  Recent Labs: 09/25/2018: TSH 2.596 11/01/2018: ALT 26; B Natriuretic Peptide 14.7; Hemoglobin 12.3; Magnesium 2.0; Platelets 189 02/19/2019: BUN 18; Creatinine, Ser 0.78; Potassium 3.4; Sodium 140   Recent Lipid Panel No results found for: CHOL, TRIG, HDL, CHOLHDL, LDLCALC, LDLDIRECT  Wt Readings from Last 3 Encounters:  03/24/19 162 lb (73.5 kg)  11/21/18 160  lb 3.2 oz (72.7 kg)  11/14/18 159 lb 5 oz (72.3 kg)     Objective:    Vital Signs:  Ht 5\' 1"  (1.549 m)   Wt 162 lb (73.5 kg)   BMI 30.61 kg/m    Normal affect. Normal speech pattern and tone. No audible signs of SOb or wheezing. Comfortable, no apparent distress.   ASSESSMENT & PLAN:    1. Chronic diastolic HF - continue to hold lasix given significant fluid losses with diarrhea from diverticulitis and low bp's, she is to update [place] on Thurs with weights and bp's.      2.Low blood pressure - in setting of fluid losses from diarrhea/diverticulitis - hold ziac, lisinopril, lasix for now. contineu verapamil. She will update [place] on Thursday      COVID-19 Education: The signs and symptoms of COVID-19 were discussed with the patient and how to seek care for testing (follow up with PCP or arrange E-visit).  The importance of social distancing was discussed today.  Time:   Today, I have spent 15 minutes with the patient with telehealth technology discussing the above problems.     Medication Adjustments/Labs and Tests Ordered: Current medicines are reviewed at length with the patient today.  Concerns regarding medicines are  outlined above.   Tests Ordered: No orders of the defined types were placed in this encounter.   Medication Changes: No orders of the defined types were placed in this encounter.   Follow Up:  Virtual Visit in 3 month(s)  Signed, Carlyle Dolly, MD  03/24/2019 10:52 AM    [place]

## 2019-03-25 ENCOUNTER — Telehealth: Payer: Self-pay | Admitting: Cardiology

## 2019-03-25 NOTE — Telephone Encounter (Signed)
Patient informed and verbalized understanding of plan. 

## 2019-03-25 NOTE — Telephone Encounter (Signed)
Says she had stomach virus for one week and tx by PCP for diverticulitis and was told to stop fluid pill. Last dose of lasix was on Saturday. Reports right ankle and right hand swelling and is unable to make a fist. Reports sob and constant heavy chest pain rated 8/10 that started this morning at 5:00 am. Vitals taken today and BP 106/76 & HR 97. Wt 163 lbs. Baseline weight 159 lbs. Advised that message would be sent to Dr. Harl Bowie and if her symptoms got worse, to go to the ED for an evaluation. Verbalized understanding.

## 2019-03-25 NOTE — Telephone Encounter (Signed)
We had spoken just yesterday and she was doing fine, sorry to hear about the issues this morning. Can restart her lasix, would stay off the other bp meds we had discussed yesterday. Update [place] on symptoms Thursday   Zandra Abts MD

## 2019-03-25 NOTE — Telephone Encounter (Signed)
Patient called stating that she is swelling really bad since stopping lasix yesterday. Really SOB , cant make a fist, chest hurting.  Said she cant wait til Thursday.

## 2019-03-26 ENCOUNTER — Other Ambulatory Visit: Payer: Self-pay | Admitting: Cardiology

## 2019-03-27 ENCOUNTER — Telehealth: Payer: Self-pay | Admitting: Cardiology

## 2019-03-27 MED ORDER — LISINOPRIL 5 MG PO TABS: 5.0000 mg | ORAL_TABLET | Freq: Every day | ORAL | 3 refills | Status: DC

## 2019-03-27 MED ORDER — BISOPROLOL-HYDROCHLOROTHIAZIDE 10-6.25 MG PO TABS: 1.0000 | ORAL_TABLET | Freq: Every day | ORAL | 12 refills | Status: DC

## 2019-03-27 NOTE — Telephone Encounter (Signed)
Pt will increase lasix to 40 mg today, tomorrow and Saturday.Will go back to 20 mg daily on Sunday.Will HOLD Lisinopril and Ziac and call on Monday 7/20 with BP and wts

## 2019-03-27 NOTE — Telephone Encounter (Signed)
I will forward to Dr.Branch for dispo 

## 2019-03-27 NOTE — Telephone Encounter (Signed)
Patient called stating that Dr. Harl Bowie requested she call to report BP & weight. States that her BP was 129/77 @ 11AM wt 163. BP on 03/27/2019 was 143/77 (taken @ 8AM) Wt 165. Patient states that she had to take a Xanax at 1000am today. Her ankles and hands are still retaining fluid.

## 2019-03-27 NOTE — Telephone Encounter (Signed)
BPs are ok, I would stay off the bp meds we held earlier this week. With uptrending weight can double her lasix x 3 days then resume prior dosing, update [place] on Monday   Zandra Abts MD

## 2019-03-31 ENCOUNTER — Telehealth: Payer: Self-pay | Admitting: *Deleted

## 2019-03-31 NOTE — Telephone Encounter (Signed)
Pt aware and voiced understanding - swelling is better and says she is breathing better

## 2019-03-31 NOTE — Telephone Encounter (Signed)
Weights have come down some, how is the swelling doing. BP's look fine, I would stay off those bp meds for now that we had stopped and continue her lasix   Zandra Abts MD

## 2019-03-31 NOTE — Telephone Encounter (Signed)
Pt called per last week phone note with BP readings - says she did not record weight for every day and did not record HR but for today   7/16 129/77  7/17 141/76 7/18 129/82 7/19 155/79 7/20 129/84 HR 89  Weight today is 162lbs

## 2019-04-15 ENCOUNTER — Telehealth: Payer: Self-pay | Admitting: Cardiology

## 2019-04-15 NOTE — Telephone Encounter (Signed)
Error/tg °

## 2019-05-07 ENCOUNTER — Telehealth: Payer: Self-pay | Admitting: Cardiology

## 2019-05-07 NOTE — Telephone Encounter (Signed)
She is taking verapamil daily 120 mg daily - was taking lisinopril 5 mg daily - pt has now contacted pcp who will see her now.

## 2019-05-07 NOTE — Telephone Encounter (Signed)
Pt rechecked BP 184/87 HR 107 - c/o dizziness/lightheadedness/unstable gait since this morning - denies chest pain/SOB/swelling - will forward to provider - pt aware with those symptoms may need ED evaluation

## 2019-05-07 NOTE — Telephone Encounter (Signed)
Pt c/o BP issue:  1. What are your last 5 BP readings? 180/91 P 103 (9AM) 166/86 P 96 177/90 P 99 (all taken on 05-07-2019 2. Are you having any other symptoms (ex. Dizziness, headache, blurred vision, passed out)? Yes , headache, dizziness, weak  3. What is your medication issue?

## 2019-05-07 NOTE — Telephone Encounter (Signed)
Ok. Thanks for letting me know. She may need to resume lisinopril.

## 2019-05-07 NOTE — Telephone Encounter (Signed)
Is she still taking verapamil? How much lisinopril had she previously been taking?

## 2019-05-21 ENCOUNTER — Telehealth: Payer: Self-pay | Admitting: Cardiology

## 2019-05-21 MED ORDER — FUROSEMIDE 20 MG PO TABS: ORAL_TABLET | ORAL | 0 refills | Status: DC

## 2019-05-21 NOTE — Telephone Encounter (Signed)
°*  STAT* If patient is at the pharmacy, call can be transferred to refill team.   1. Which medications need to be refilled? furosemide (LASIX) 20 MG tablet   2. Which pharmacy/location (including street and city if local pharmacy) is medication to be sent to? [facility] 3. Do they need a 30 day or 90 day supply?   ASKING FOR 20  MG X 2 OR 40 MG

## 2019-05-21 NOTE — Telephone Encounter (Signed)
Medication sent to pharmacy  

## 2019-05-29 ENCOUNTER — Ambulatory Visit (INDEPENDENT_AMBULATORY_CARE_PROVIDER_SITE_OTHER): Payer: 59 | Admitting: Cardiology

## 2019-05-29 ENCOUNTER — Other Ambulatory Visit: Payer: Self-pay

## 2019-05-29 ENCOUNTER — Encounter: Payer: Self-pay | Admitting: Cardiology

## 2019-05-29 VITALS — BP 148/74 | HR 82 | Temp 97.1°F | Ht 61.0 in | Wt 165.0 lb

## 2019-05-29 DIAGNOSIS — I10 Essential (primary) hypertension: Secondary | ICD-10-CM

## 2019-05-29 DIAGNOSIS — I5032 Chronic diastolic (congestive) heart failure: Secondary | ICD-10-CM | POA: Diagnosis not present

## 2019-05-29 DIAGNOSIS — R0609 Other forms of dyspnea: Secondary | ICD-10-CM

## 2019-05-29 DIAGNOSIS — R0600 Dyspnea, unspecified: Secondary | ICD-10-CM

## 2019-05-29 MED ORDER — POTASSIUM CHLORIDE CRYS ER 20 MEQ PO TBCR: 40.0000 meq | EXTENDED_RELEASE_TABLET | Freq: Every day | ORAL | 1 refills | Status: DC | PRN

## 2019-05-29 MED ORDER — FUROSEMIDE 20 MG PO TABS: ORAL_TABLET | ORAL | 0 refills | Status: DC

## 2019-05-29 MED ORDER — SPIRONOLACTONE 50 MG PO TABS: 50.0000 mg | ORAL_TABLET | ORAL | 2 refills | Status: DC

## 2019-05-29 NOTE — Progress Notes (Signed)
Primary Physician/Referring:  Glenda Chroman, MD  Patient ID: [name] [name], female    DOB: 05-27-1961, 58 y.o.   MRN: [id number]  Chief Complaint  Patient presents with   New Patient (Initial Visit)   Congestive Heart Failure   HPI:    [name] [name]  is a 58 y.o. Caucasian female with chronic diastolic heart failure, normal coronary arteries by angiography in 2005 and a normal stress echocardiogram in 2014, also negative nuclear stress test in 2018 and has hyperdynamic left ventricle with grade 1 diastolic dysfunction by echocardiogram in January 2020 referred to me to establish care due to continued dyspnea, hypertension.  Patient been evaluated by Dr. Harl Bowie, cardiology([facility]) but wanted to change.  Past medical history significant for panic attacks, anxiety, hypertension, hyperlipidemia, diabetes mellitus.  Patient referred to me to reestablish cardiac care, for evaluation of chronic diastolic heart failure dyspnea on exertion,  Hypertension. She was referred by Dr. Floyde Parkins.   Due to "fluid overload" recently lisinopril discontinued and added high dose lasix. She has noticed uncontrolled hypertension since and lisinopril added by her PCP at a lower dose recently. Denies chest pain, palpitations. Continues to have chronic dyspnea on exertion without PND or orthopnea. Non-smoker.    Past Medical History:  Diagnosis Date   Abnormal EKG    Allergy    Anxiety    PTSD   CHF (congestive heart failure) (HCC)    Depression    Depression    Diabetes mellitus without complication (HCC)    Dyspnea    GERD (gastroesophageal reflux disease)    Hyperlipidemia    Hypertension    Unspecified   IBS (irritable bowel syndrome)    Kidney stone    Panic disorder 03/20/2019   Seizures ([place])    Reports last seizure was 30 + years ago    Sleep apnea    no CPAP worn   Stroke [facility])    Past Surgical History:  Procedure Laterality Date   HYSTEROSCOPY  2001   submucous  myomectomy   TOTAL ABDOMINAL HYSTERECTOMY  2002   TAH BSO  pathology showed leiomyoma, adenomyosis, simple and complex hyperplasia without atypia   TUMOR REMOVAL     from left ear   Social History   Socioeconomic History   Marital status: Married    Spouse name: Not on file   Number of children: 0   Years of education: Not on file   Highest education level: Some college, no degree  Occupational History   Occupation: Full Time    Comment: Environmental consultant strain: Not on file   Food insecurity    Worry: Not on file    Inability: Not on file   Transportation needs    Medical: Not on file    Non-medical: Not on file  Tobacco Use   Smoking status: Never Smoker   Smokeless tobacco: Never Used  Substance and Sexual Activity   Alcohol use: No    Alcohol/week: 0.0 standard drinks   Drug use: No   Sexual activity: Not Currently    Comment: 1st intercourse 58 yo-Fewer than 5 partners  Lifestyle   Physical activity    Days per week: Not on file    Minutes per session: Not on file   Stress: Not on file  Relationships   Social connections    Talks on phone: Not on file    Gets together: Not on file    Attends religious service:  Not on file    Active member of club or organization: Not on file    Attends meetings of clubs or organizations: Not on file    Relationship status: Not on file   Intimate partner violence    Fear of current or ex partner: Not on file    Emotionally abused: Not on file    Physically abused: Not on file    Forced sexual activity: Not on file  Other Topics Concern   Not on file  Social History Narrative   Right handed   Caffeine 1-2 cups daily    Lives at home with husband with 2 dogs    Regular exercise: No   ROS  Review of Systems  Constitution: Negative for chills, decreased appetite, malaise/fatigue and weight gain.  Cardiovascular: Positive for dyspnea on exertion and leg swelling (occasional).  Negative for syncope.  Endocrine: Negative for cold intolerance.  Hematologic/Lymphatic: Does not bruise/bleed easily.  Musculoskeletal: Positive for joint pain. Negative for joint swelling.  Gastrointestinal: Negative for abdominal pain, anorexia, change in bowel habit, hematochezia and melena.  Neurological: Negative for headaches and light-headedness.  Psychiatric/Behavioral: Positive for depression. Negative for substance abuse. The patient is nervous/anxious.   All other systems reviewed and are negative.  Objective   Vitals with BMI 05/29/2019 03/24/2019 11/21/2018  Height 5\' 1"  5\' 1"  5\' 1"   Weight 165 lbs 162 lbs 160 lbs 3 oz  BMI 31.19 [id number]  Systolic 123456 - 123456  Diastolic 74 - 74  Pulse 82 - 64    Blood pressure (!) 148/74, pulse 82, temperature (!) 97.1 F (36.2 C), height 5\' 1"  (1.549 m), weight 165 lb (74.8 kg), SpO2 95 %. Body mass index is 31.18 kg/m.   Physical Exam  Constitutional:  Short stature, mildly obese and in no acute distress.  HENT:  Head: Atraumatic.  Eyes: Conjunctivae are normal.  Neck: Neck supple. No JVD present. No thyromegaly present.  Cardiovascular: Normal rate, regular rhythm, normal heart sounds and intact distal pulses. Exam reveals no gallop.  No murmur heard. Pulmonary/Chest: Effort normal and breath sounds normal.  Abdominal: Soft. Bowel sounds are normal.  Musculoskeletal: Normal range of motion.  Neurological: She is alert.  Skin: Skin is warm and dry.  Psychiatric: She has a normal mood and affect.   Radiology: No results found.  Laboratory examination:   Recent Labs    01/17/19 1219 01/22/19 1024 02/19/19 1600  NA 138 138 140  K 3.4* 3.6 3.4*  CL 98 101 100  CO2 28 25 27   GLUCOSE 157* 148* 206*  BUN 23* 21* 18  CREATININE 0.96 0.71 0.78  CALCIUM 9.3 9.7 9.5  GFRNONAA >60 >60 >60  GFRAA >60 >60 >60   CMP Latest Ref Rng & Units 02/19/2019 01/22/2019 01/17/2019  Glucose 70 - 99 mg/dL 206(H) 148(H) 157(H)  BUN 6 - 20  mg/dL 18 21(H) 23(H)  Creatinine 0.44 - 1.00 mg/dL 0.78 0.71 0.96  Sodium 135 - 145 mmol/L 140 138 138  Potassium 3.5 - 5.1 mmol/L 3.4(L) 3.6 3.4(L)  Chloride 98 - 111 mmol/L 100 101 98  CO2 22 - 32 mmol/L 27 25 28   Calcium 8.9 - 10.3 mg/dL 9.5 9.7 9.3  Total Protein 6.5 - 8.1 g/dL - - -  Total Bilirubin 0.3 - 1.2 mg/dL - - -  Alkaline Phos 38 - 126 U/L - - -  AST 15 - 41 U/L - - -  ALT 0 - 44 U/L - - -  CBC Latest Ref Rng & Units 11/01/2018 12/28/2017 09/30/2015  WBC 4.0 - 10.5 K/uL 7.1 9.7 5.4  Hemoglobin 12.0 - 15.0 g/dL 12.3 13.6 12.1  Hematocrit 36.0 - 46.0 % 40.0 41.7 36.2  Platelets 150 - 400 K/uL 189 257 155.0   Lipid Panel  No results found for: CHOL, TRIG, HDL, CHOLHDL, VLDL, LDLCALC, LDLDIRECT HEMOGLOBIN A1C No results found for: HGBA1C, MPG TSH Recent Labs    09/25/18 1129  TSH 2.596   Medications   Prior to Admission medications   Medication Sig Start Date End Date Taking? Authorizing Provider  ALPRAZolam Duanne Moron) 0.5 MG tablet Take 0.5 mg by mouth at bedtime as needed for anxiety.   Yes [provider]  ALPRAZolam Duanne Moron) 1 MG tablet Take 1 mg by mouth 3 (three) times daily.   Yes [provider]  aspirin 81 MG tablet Take 81 mg by mouth at bedtime.    Yes [provider]  buPROPion (WELLBUTRIN SR) 150 MG 12 hr tablet Take 150 mg by mouth daily.   Yes [provider]  citalopram (CELEXA) 40 MG tablet Take 40 mg by mouth daily.   Yes [provider]  furosemide (LASIX) 20 MG tablet Take 20 mg daily, may take an extra 20 mg if weight is over 159 lbs , or, for significant swelling 05/21/19  Yes Branch, Alphonse Guild, MD  gabapentin (NEURONTIN) 400 MG capsule Take 400 mg by mouth 2 (two) times daily.   Yes [provider]  glimepiride (AMARYL) 2 MG tablet Take 2 mg by mouth daily with breakfast.    Yes [provider]  lisinopril (ZESTRIL) 5 MG tablet Take 5 mg by mouth daily.   Yes [provider]    metoprolol succinate (TOPROL-XL) 50 MG 24 hr tablet Take 50 mg by mouth daily. 05/16/19  Yes [provider]  omeprazole (PRILOSEC) 20 MG capsule Take 20 mg by mouth at bedtime.  07/02/14  Yes [provider]  potassium chloride SA (K-DUR) 20 MEQ tablet Take 2 tablets (40 mEq total) by mouth daily. 02/21/19  Yes Arnoldo Lenis, MD  pravastatin (PRAVACHOL) 20 MG tablet TAKE 1 TABLET BY MOUTH EVERY DAY IN THE EVENING 03/26/19  Yes Branch, Alphonse Guild, MD  Semaglutide,0.25 or 0.5MG /DOS, (OZEMPIC, 0.25 OR 0.5 MG/DOSE,) 2 MG/1.5ML SOPN Inject 2.5 mLs into the skin every Wednesday.  08/14/18  Yes [provider]  verapamil (CALAN-SR) 120 MG CR tablet Take 120 mg by mouth at bedtime.  07/02/14  Yes [provider]     Current Outpatient Medications  Medication Instructions   ALPRAZolam (XANAX) 1 mg, Oral, 3 times daily   ALPRAZolam (XANAX) 0.5 mg, Oral, At bedtime PRN   aspirin 81 mg, Oral, Daily at bedtime   buPROPion (WELLBUTRIN SR) 150 mg, Oral, Daily   citalopram (CELEXA) 40 mg, Oral, Daily   furosemide (LASIX) 20 MG tablet Twice daily as needed if weight >3 Lbs   gabapentin (NEURONTIN) 400 mg, Oral, 2 times daily   glimepiride (AMARYL) 2 mg, Oral, Daily with breakfast   lisinopril (ZESTRIL) 5 mg, Oral, Daily   metoprolol succinate (TOPROL-XL) 50 mg, Oral, Daily   omeprazole (PRILOSEC) 20 mg, Oral, Daily at bedtime   potassium chloride SA (K-DUR) 20 MEQ tablet 40 mEq, Oral, Daily PRN, With Furosemide   pravastatin (PRAVACHOL) 20 MG tablet TAKE 1 TABLET BY MOUTH EVERY DAY IN THE EVENING   Semaglutide,0.25 or 0.5MG /DOS, (OZEMPIC, 0.25 OR 0.5 MG/DOSE,) 2 MG/1.5ML SOPN 2.5 mLs,  Subcutaneous, Every Wed   spironolactone (ALDACTONE) 50 mg, Oral, BH-each morning   verapamil (CALAN-SR) 120 mg, Oral, Daily at bedtime    Cardiac Studies:   Lexiscan Myoview Stress Test 02/23/2017:  Blood pressure demonstrated a hypertensive response to  exercise.  There was no ST segment deviation noted during stress.  The study is normal.  This is a low risk study.  Nuclear stress EF: 85%   Echocardiogram 09/25/2018:   Left ventricle: The cavity size was normal. Wall thickness was   increased in a pattern of mild LVH. Systolic function was normal.   The estimated ejection fraction was in the range of 60% to 65%.   Wall motion was normal; there were no regional wall motion   abnormalities. Doppler parameters are consistent with abnormal   left ventricular relaxation (grade 1 diastolic dysfunction). - Aortic valve: Mildly calcified annulus. Trileaflet. - Mitral valve: Mildly calcified annulus. There was trivial   regurgitation. - Right atrium: Central venous pressure (est): 3 mm Hg. - Atrial septum: There was increased thickness of the septum,   consistent with lipomatous hypertrophy. - Tricuspid valve: There was trivial regurgitation. - Pulmonary arteries: Systolic pressure could not be accurately   estimated. - Pericardium, extracardiac: A prominent pericardial fat pad was   present. Cannot exclude small to moderate sized, organized   circumferential pericardial effusion. Could consider CT imaging.  Carotid artery duplex 01/16/2019:  Bilateral 1- 39% stenosis.  Antegrade bilateral vertebral flow.  Assessment     ICD-10-CM   1. Chronic diastolic heart failure (HCC)  I50.32 EKG 12-Lead    spironolactone (ALDACTONE) 50 MG tablet    furosemide (LASIX) 20 MG tablet    potassium chloride SA (K-DUR) 20 MEQ tablet  2. Dyspnea on exertion  R06.09   3. Essential hypertension  I10 spironolactone (ALDACTONE) 50 MG tablet    Furosemide 20 mg b.i.d. changed to p.r.n., potassium 20 mEq 2 tab Qdaily changed to PRN with Furosemide.   EKG 05/29/2019: Normal sinus rhythm at rate of 80 bpm, left atrial enlargement, left axis deviation, left anterior fascicular block.  Incomplete right bundle branch block.  LVH with repolarization abnormality,  cannot exclude high lateral ischemia.  Abnormal EKG. No significant change from  10/31/2018.  Recommendations:   Patient referred to me to reestablish cardiac care, for evaluation of chronic diastolic heart failure dyspnea on exertion,  Hypertension. She was referred by Dr. Floyde Parkins.   After long discussions with the patient, advised her that her diastolic heart failure is related to her lifestyle.  I have discussed the mechanism of congestive heart failure. She has also been gradually been gaining weight.  She has hyperdynamic LVEF related to over diuresis and deconditioning.   She appears to be motivated.  She does admit to eating salty food.  She is willing to make lifestyle changes.  I have changed her Lasix 20 mg b.i.d. and potassium 40 mg daily to be taken only on a p.r.n. basis for weight gain of greater than 3 pounds.  I would like to start her on spironolactone 50 mg in the morning, both for hypertension and HFpEF. she is already scheduled for blood work including CMP to be done in 2 weeks with her PCP and I'll obtain those results.  Blood pressure is elevated today but adding spironolactone will probably help.  She is on a very low dose of lisinopril that was restarted due to increasing blood pressure, previously was stopped by Dr. branch due to low blood pressure  after she had GI issues.  I do not suspect she has significant coronary artery disease.  Her vascular examination is normal.  EKG does reveal hypertensive changes.  I'll see her back in 4-6 weeks for follow-up.  Adrian Prows, MD, [facility] 05/29/2019, 3:29 PM [facility] [place] Pager: [phone] Office: [phone] If no answer Cell [phone]

## 2019-06-10 ENCOUNTER — Encounter: Payer: Self-pay | Admitting: Gynecology

## 2019-06-26 ENCOUNTER — Ambulatory Visit: Payer: 59 | Admitting: Cardiology

## 2019-07-18 ENCOUNTER — Other Ambulatory Visit: Payer: Self-pay

## 2019-07-18 ENCOUNTER — Encounter: Payer: Self-pay | Admitting: Cardiology

## 2019-07-18 ENCOUNTER — Ambulatory Visit (INDEPENDENT_AMBULATORY_CARE_PROVIDER_SITE_OTHER): Payer: 59 | Admitting: Cardiology

## 2019-07-18 VITALS — BP 123/59 | HR 78 | Temp 97.9°F | Ht 61.0 in | Wt 165.0 lb

## 2019-07-18 DIAGNOSIS — I10 Essential (primary) hypertension: Secondary | ICD-10-CM

## 2019-07-18 DIAGNOSIS — R0609 Other forms of dyspnea: Secondary | ICD-10-CM

## 2019-07-18 DIAGNOSIS — R0600 Dyspnea, unspecified: Secondary | ICD-10-CM | POA: Diagnosis not present

## 2019-07-18 DIAGNOSIS — I5032 Chronic diastolic (congestive) heart failure: Secondary | ICD-10-CM

## 2019-07-18 DIAGNOSIS — E782 Mixed hyperlipidemia: Secondary | ICD-10-CM

## 2019-07-18 MED ORDER — SPIRONOLACTONE 50 MG PO TABS: 50.0000 mg | ORAL_TABLET | ORAL | 1 refills | Status: DC

## 2019-07-18 MED ORDER — METOPROLOL SUCCINATE ER 50 MG PO TB24: 50.0000 mg | ORAL_TABLET | Freq: Every day | ORAL | 1 refills | Status: DC

## 2019-07-18 NOTE — Progress Notes (Signed)
Primary Physician/Referring:  Glenda Chroman, MD  Patient ID: [name] [name], female    DOB: July 20, 1961, 58 y.o.   MRN: [id number]  Chief Complaint  Patient presents with  . Congestive Heart Failure  . Follow-up    6 weeks   HPI:    [name] [name]  is a 58 y.o. Caucasian female with chronic diastolic heart failure, normal coronary arteries by angiography in 2005 and a normal stress echocardiogram in 2014, also negative nuclear stress test in 2018 and has hyperdynamic left ventricle with grade 1 diastolic dysfunction by echocardiogram in January 2020 presents for follow-up of chronic diastolic heart failure.  On her last office visit I changed her furosemide to as needed and added spironolactone.    Patient previously evaluated by Dr. Harl Bowie, cardiology([facility]) but wanted to change.  Past medical history significant for panic attacks, anxiety, hypertension, hyperlipidemia, diabetes mellitus.  Patient referred to me to reestablish cardiac care, for evaluation of chronic diastolic heart failure dyspnea on exertion,  hypertension.   She is a non-smoker.  On her last office visit we had also discussed regarding weight loss and diastolic heart failure in detail, Made furosemide p.r.n. and added Aldactone.  She now presents for follow-up at 6 weeks. States that she feels well, has noticed blood pressure to be very well controlled and occasionally has dizziness, no palpitations, chest pain.  Has started to walk on a regular basis.  Dyspnea is stable.  Past Medical History:  Diagnosis Date  . Abnormal EKG   . Allergy   . Anxiety    PTSD  . CHF (congestive heart failure) ([place])   . Depression   . Depression   . Diabetes mellitus without complication ([place])   . Dyspnea   . GERD (gastroesophageal reflux disease)   . Hyperlipidemia   . Hypertension    Unspecified  . IBS (irritable bowel syndrome)   . Kidney stone   . Panic disorder 03/20/2019  . Seizures ([place])    Reports last seizure was 30 +  years ago   . Sleep apnea    no CPAP worn  . Stroke [facility])    Past Surgical History:  Procedure Laterality Date  . HYSTEROSCOPY  2001   submucous myomectomy  . TOTAL ABDOMINAL HYSTERECTOMY  2002   TAH BSO  pathology showed leiomyoma, adenomyosis, simple and complex hyperplasia without atypia  . TUMOR REMOVAL     from left ear   Social History   Socioeconomic History  . Marital status: Married    Spouse name: Not on file  . Number of children: 0  . Years of education: Not on file  . Highest education level: Some college, no degree  Occupational History  . Occupation: Full Time    Comment: Sales  Social Needs  . Financial resource strain: Not on file  . Food insecurity    Worry: Not on file    Inability: Not on file  . Transportation needs    Medical: Not on file    Non-medical: Not on file  Tobacco Use  . Smoking status: Never Smoker  . Smokeless tobacco: Never Used  Substance and Sexual Activity  . Alcohol use: No    Alcohol/week: 0.0 standard drinks  . Drug use: No  . Sexual activity: Not Currently    Comment: 1st intercourse 58 yo-Fewer than 5 partners  Lifestyle  . Physical activity    Days per week: Not on file    Minutes per session: Not on  file  . Stress: Not on file  Relationships  . Social Herbalist on phone: Not on file    Gets together: Not on file    Attends religious service: Not on file    Active member of club or organization: Not on file    Attends meetings of clubs or organizations: Not on file    Relationship status: Not on file  . Intimate partner violence    Fear of current or ex partner: Not on file    Emotionally abused: Not on file    Physically abused: Not on file    Forced sexual activity: Not on file  Other Topics Concern  . Not on file  Social History Narrative   Right handed   Caffeine 1-2 cups daily    Lives at home with husband with 2 dogs    Regular exercise: No   ROS  Review of Systems  Constitution:  Negative for chills, decreased appetite, malaise/fatigue and weight gain.  Cardiovascular: Positive for dyspnea on exertion and leg swelling (occasional). Negative for syncope.  Endocrine: Negative for cold intolerance.  Hematologic/Lymphatic: Does not bruise/bleed easily.  Musculoskeletal: Positive for joint pain. Negative for joint swelling.  Gastrointestinal: Negative for abdominal pain, anorexia, change in bowel habit, hematochezia and melena.  Neurological: Negative for headaches and light-headedness.  Psychiatric/Behavioral: Positive for depression. Negative for substance abuse. The patient is nervous/anxious.   All other systems reviewed and are negative.  Objective   Vitals with BMI 07/18/2019 05/29/2019 03/24/2019  Height 5' 1"  5' 1"  5' 1"   Weight 165 lbs 165 lbs 162 lbs  BMI 31.19 74.25 95.63  Systolic 875 643 -  Diastolic 59 74 -  Pulse 78 82 -    Blood pressure (!) 123/59, pulse 78, temperature 97.9 F (36.6 C), height 5' 1"  (1.549 m), weight 165 lb (74.8 kg), SpO2 96 %. Body mass index is 31.18 kg/m.   Physical Exam  Constitutional:  Short stature, mildly obese and in no acute distress.  HENT:  Head: Atraumatic.  Eyes: Conjunctivae are normal.  Neck: Neck supple. No JVD present. No thyromegaly present.  Cardiovascular: Normal rate, regular rhythm, normal heart sounds and intact distal pulses. Exam reveals no gallop.  No murmur heard. Pulmonary/Chest: Effort normal and breath sounds normal.  Abdominal: Soft. Bowel sounds are normal.  Musculoskeletal: Normal range of motion.  Neurological: She is alert.  Skin: Skin is warm and dry.  Psychiatric: She has a normal mood and affect.   Radiology: No results found.  Laboratory examination:   Labs 06/12/2019: Serum glucose 212 mg, BUN 15, creatinine 0.77, eGFR >60 mL, potassium 4.1, CMP otherwise normal. Total cholesterol 182, triglycerides 426, HDL 28, LDL 85. CBC normal, TSH normal.  Recent Labs    01/17/19 1219  01/22/19 1024 02/19/19 1600  NA 138 138 140  K 3.4* 3.6 3.4*  CL 98 101 100  CO2 28 25 27   GLUCOSE 157* 148* 206*  BUN 23* 21* 18  CREATININE 0.96 0.71 0.78  CALCIUM 9.3 9.7 9.5  GFRNONAA >60 >60 >60  GFRAA >60 >60 >60   CMP Latest Ref Rng & Units 02/19/2019 01/22/2019 01/17/2019  Glucose 70 - 99 mg/dL 206(H) 148(H) 157(H)  BUN 6 - 20 mg/dL 18 21(H) 23(H)  Creatinine 0.44 - 1.00 mg/dL 0.78 0.71 0.96  Sodium 135 - 145 mmol/L 140 138 138  Potassium 3.5 - 5.1 mmol/L 3.4(L) 3.6 3.4(L)  Chloride 98 - 111 mmol/L 100 101 98  CO2  22 - 32 mmol/L 27 25 28   Calcium 8.9 - 10.3 mg/dL 9.5 9.7 9.3  Total Protein 6.5 - 8.1 g/dL - - -  Total Bilirubin 0.3 - 1.2 mg/dL - - -  Alkaline Phos 38 - 126 U/L - - -  AST 15 - 41 U/L - - -  ALT 0 - 44 U/L - - -   CBC Latest Ref Rng & Units 11/01/2018 12/28/2017 09/30/2015  WBC 4.0 - 10.5 K/uL 7.1 9.7 5.4  Hemoglobin 12.0 - 15.0 g/dL 12.3 13.6 12.1  Hematocrit 36.0 - 46.0 % 40.0 41.7 36.2  Platelets 150 - 400 K/uL 189 257 155.0   Lipid Panel  No results found for: CHOL, TRIG, HDL, CHOLHDL, VLDL, LDLCALC, LDLDIRECT HEMOGLOBIN A1C No results found for: HGBA1C, MPG TSH Recent Labs    09/25/18 1129  TSH 2.596   Medications   Prior to Admission medications   Medication Sig Start Date End Date Taking? Authorizing Provider  ALPRAZolam Duanne Moron) 0.5 MG tablet Take 0.5 mg by mouth at bedtime as needed for anxiety.   Yes [provider]  ALPRAZolam Duanne Moron) 1 MG tablet Take 1 mg by mouth 3 (three) times daily.   Yes [provider]  aspirin 81 MG tablet Take 81 mg by mouth at bedtime.    Yes [provider]  buPROPion (WELLBUTRIN SR) 150 MG 12 hr tablet Take 150 mg by mouth daily.   Yes [provider]  citalopram (CELEXA) 40 MG tablet Take 40 mg by mouth daily.   Yes [provider]  furosemide (LASIX) 20 MG tablet Take 20 mg daily, may take an extra 20 mg if weight is over 159 lbs , or, for significant swelling  05/21/19  Yes Branch, Alphonse Guild, MD  gabapentin (NEURONTIN) 400 MG capsule Take 400 mg by mouth 2 (two) times daily.   Yes [provider]  glimepiride (AMARYL) 2 MG tablet Take 2 mg by mouth daily with breakfast.    Yes [provider]  lisinopril (ZESTRIL) 5 MG tablet Take 5 mg by mouth daily.   Yes [provider]  metoprolol succinate (TOPROL-XL) 50 MG 24 hr tablet Take 50 mg by mouth daily. 05/16/19  Yes [provider]  omeprazole (PRILOSEC) 20 MG capsule Take 20 mg by mouth at bedtime.  07/02/14  Yes [provider]  potassium chloride SA (K-DUR) 20 MEQ tablet Take 2 tablets (40 mEq total) by mouth daily. 02/21/19  Yes Arnoldo Lenis, MD  pravastatin (PRAVACHOL) 20 MG tablet TAKE 1 TABLET BY MOUTH EVERY DAY IN THE EVENING 03/26/19  Yes Branch, Alphonse Guild, MD  Semaglutide,0.25 or 0.5MG/DOS, (OZEMPIC, 0.25 OR 0.5 MG/DOSE,) 2 MG/1.5ML SOPN Inject 2.5 mLs into the skin every Wednesday.  08/14/18  Yes [provider]  verapamil (CALAN-SR) 120 MG CR tablet Take 120 mg by mouth at bedtime.  07/02/14  Yes [provider]     Current Outpatient Medications  Medication Instructions  . ALPRAZolam (XANAX) 1 mg, Oral, 3 times daily PRN  . ALPRAZolam (XANAX) 0.5 mg, Oral, Daily at bedtime  . aspirin 81 mg, Oral, Daily at bedtime  . buPROPion (WELLBUTRIN SR) 150 mg, Oral, Daily  . citalopram (CELEXA) 40 mg, Oral, Daily  . furosemide (LASIX) 20 MG tablet Twice daily as needed if weight >3 Lbs  . gabapentin (NEURONTIN) 400 mg, Oral, 2 times daily  . glimepiride (AMARYL) 2 mg, Oral, Daily with breakfast  . lisinopril (ZESTRIL) 5 mg, Oral, Daily  .  metoprolol succinate (TOPROL-XL) 50 mg, Oral, Daily  . omeprazole (PRILOSEC) 20 mg, Oral, Daily at bedtime  . potassium chloride SA (K-DUR) 20 MEQ tablet 40 mEq, Oral, Daily PRN, With Furosemide  . pravastatin (PRAVACHOL) 20 MG tablet TAKE 1 TABLET BY MOUTH EVERY DAY IN THE EVENING  .  Semaglutide,0.25 or 0.5MG/DOS, (OZEMPIC, 0.25 OR 0.5 MG/DOSE,) 2 MG/1.5ML SOPN 2.5 mLs, Subcutaneous, Every Wed  . spironolactone (ALDACTONE) 50 mg, Oral, BH-each morning  . verapamil (CALAN-SR) 120 mg, Oral, Daily at bedtime    Cardiac Studies:   Lexiscan Myoview Stress Test 02/23/2017:  Blood pressure demonstrated a hypertensive response to exercise.  There was no ST segment deviation noted during stress.  The study is normal.  This is a low risk study.  Nuclear stress EF: 85%   Echocardiogram 09/25/2018:   Left ventricle: The cavity size was normal. Wall thickness was   increased in a pattern of mild LVH. Systolic function was normal.   The estimated ejection fraction was in the range of 60% to 65%.   Wall motion was normal; there were no regional wall motion   abnormalities. Doppler parameters are consistent with abnormal   left ventricular relaxation (grade 1 diastolic dysfunction). - Aortic valve: Mildly calcified annulus. Trileaflet. - Mitral valve: Mildly calcified annulus. There was trivial   regurgitation. - Right atrium: Central venous pressure (est): 3 mm Hg. - Atrial septum: There was increased thickness of the septum,   consistent with lipomatous hypertrophy. - Tricuspid valve: There was trivial regurgitation. - Pulmonary arteries: Systolic pressure could not be accurately   estimated. - Pericardium, extracardiac: A prominent pericardial fat pad was   present. Cannot exclude small to moderate sized, organized   circumferential pericardial effusion. Could consider CT imaging.  Carotid artery duplex 01/16/2019:  Bilateral 1- 39% stenosis.  Antegrade bilateral vertebral flow.  Assessment     ICD-10-CM   1. Chronic diastolic heart failure (HCC)  I50.32   2. Dyspnea on exertion  R06.00   3. Essential hypertension  I10 metoprolol succinate (TOPROL-XL) 50 MG 24 hr tablet  4. Mixed hyperlipidemia  E78.2     EKG 05/29/2019: Normal sinus rhythm at rate of 80 bpm, left  atrial enlargement, left axis deviation, left anterior fascicular block.  Incomplete right bundle branch block.  LVH with repolarization abnormality, cannot exclude high lateral ischemia.  Abnormal EKG. No significant change from  10/31/2018.  Recommendations:   Meds ordered this encounter  Medications  . metoprolol succinate (TOPROL-XL) 50 MG 24 hr tablet    Sig: Take 1 tablet (50 mg total) by mouth daily.    Dispense:  90 tablet    Refill:  1  . spironolactone (ALDACTONE) 50 MG tablet    Sig: Take 1 tablet (50 mg total) by mouth every morning.    Dispense:  90 tablet    Refill:  1    [name] [name]  is a Caucasian female with chronic diastolic heart failure, normal coronary arteries by angiography in 2005 and a normal stress echocardiogram in 2014, also negative nuclear stress test in 2018 and has hyperdynamic left ventricle with grade 1 diastolic dysfunction by echocardiogram in January 2020 presents for follow-up of chronic diastolic heart failure.  On her last office visit I changed her furosemide to as needed and added spironolactone.  She is feeling well, dyspnea is improved, blood pressure is also very well controlled.  I reviewed her labs, she has mixed hyperlipidemia related to uncontrolled diabetes mellitus.  However she  is making significant lifestyle changes, I expect is to improve.  Hence I did not address her lipids today. I did review her labs.  I have again discussed with her regarding weight loss.  She appears to be motivated again.  She is wondering whether she can join a nutritional rehabilitation, she'll discuss this further with her PCP.  Adrian Prows, MD, [facility] 07/18/2019, 2:44 PM [facility]. [place] Pager: [phone] Office: [phone] If no answer Cell [phone]

## 2019-07-19 ENCOUNTER — Encounter: Payer: Self-pay | Admitting: Cardiology

## 2019-08-04 ENCOUNTER — Emergency Department (HOSPITAL_COMMUNITY): Payer: 59

## 2019-08-04 ENCOUNTER — Emergency Department (HOSPITAL_COMMUNITY)
Admission: EM | Admit: 2019-08-04 | Discharge: 2019-08-05 | Discharge status: Against Medical Advice | Payer: 59 | Attending: Emergency Medicine | Admitting: Emergency Medicine

## 2019-08-04 ENCOUNTER — Other Ambulatory Visit: Payer: Self-pay

## 2019-08-04 DIAGNOSIS — Z5321 Procedure and treatment not carried out due to patient leaving prior to being seen by health care provider: Secondary | ICD-10-CM | POA: Insufficient documentation

## 2019-08-04 DIAGNOSIS — R0789 Other chest pain: Secondary | ICD-10-CM | POA: Diagnosis present

## 2019-08-04 LAB — BASIC METABOLIC PANEL
Anion gap: 11 (ref 5–15)
BUN: 14 mg/dL (ref 6–20)
CO2: 21 mmol/L — ABNORMAL LOW (ref 22–32)
Calcium: 9.4 mg/dL (ref 8.9–10.3)
Chloride: 102 mmol/L (ref 98–111)
Creatinine, Ser: 0.9 mg/dL (ref 0.44–1.00)
GFR calc Af Amer: >60 mL/min (ref >60)
GFR calc non Af Amer: >60 mL/min (ref >60)
Glucose, Bld: 322 mg/dL — ABNORMAL HIGH (ref 70–99)
Potassium: 3.7 mmol/L (ref 3.5–5.1)
Sodium: 134 mmol/L — ABNORMAL LOW (ref 135–145)

## 2019-08-04 LAB — CBC
HCT: 37.9 % (ref 36.0–46.0)
Hemoglobin: 12.5 g/dL (ref 12.0–15.0)
MCH: 29.8 pg (ref 26.0–34.0)
MCHC: 33 g/dL (ref 30.0–36.0)
MCV: 90.2 fL (ref 80.0–100.0)
Platelets: 169 10*3/uL (ref 150–400)
RBC: 4.2 MIL/uL (ref 3.87–5.11)
RDW: 13.2 % (ref 11.5–15.5)
WBC: 5.5 10*3/uL (ref 4.0–10.5)
nRBC: 0 % (ref 0.0–0.2)

## 2019-08-04 LAB — I-STAT BETA HCG BLOOD, ED (MC, WL, AP ONLY): I-stat hCG, quantitative: <5 m[IU]/mL (ref <5)

## 2019-08-04 LAB — TROPONIN I (HIGH SENSITIVITY): Troponin I (High Sensitivity): 3 ng/L (ref <18)

## 2019-08-04 NOTE — ED Notes (Signed)
Pt seen leaving lobby with her husband.

## 2019-08-04 NOTE — ED Triage Notes (Signed)
Pt reports left sided chest pain last night, then jaw pain woke her up out of her sleep today. Called her cardiologist and sent here. Endorses dull left sided pain.

## 2019-08-06 ENCOUNTER — Telehealth: Payer: Self-pay

## 2019-08-13 NOTE — Telephone Encounter (Signed)
error 

## 2019-08-15 ENCOUNTER — Other Ambulatory Visit: Payer: Self-pay | Admitting: Obstetrics and Gynecology

## 2019-08-15 ENCOUNTER — Other Ambulatory Visit: Payer: Self-pay | Admitting: Obstetrics & Gynecology

## 2019-08-15 DIAGNOSIS — Z1231 Encounter for screening mammogram for malignant neoplasm of breast: Secondary | ICD-10-CM

## 2019-08-28 ENCOUNTER — Other Ambulatory Visit: Payer: Self-pay | Admitting: Cardiology

## 2019-09-29 DIAGNOSIS — E669 Obesity, unspecified: Secondary | ICD-10-CM | POA: Diagnosis not present

## 2019-09-29 DIAGNOSIS — E785 Hyperlipidemia, unspecified: Secondary | ICD-10-CM | POA: Diagnosis not present

## 2019-09-29 DIAGNOSIS — I110 Hypertensive heart disease with heart failure: Secondary | ICD-10-CM | POA: Diagnosis not present

## 2019-09-29 DIAGNOSIS — R69 Illness, unspecified: Secondary | ICD-10-CM | POA: Diagnosis not present

## 2019-09-29 DIAGNOSIS — E261 Secondary hyperaldosteronism: Secondary | ICD-10-CM | POA: Diagnosis not present

## 2019-09-29 DIAGNOSIS — E1142 Type 2 diabetes mellitus with diabetic polyneuropathy: Secondary | ICD-10-CM | POA: Diagnosis not present

## 2019-09-29 DIAGNOSIS — I509 Heart failure, unspecified: Secondary | ICD-10-CM | POA: Diagnosis not present

## 2019-09-29 DIAGNOSIS — R32 Unspecified urinary incontinence: Secondary | ICD-10-CM | POA: Diagnosis not present

## 2019-09-29 DIAGNOSIS — K219 Gastro-esophageal reflux disease without esophagitis: Secondary | ICD-10-CM | POA: Diagnosis not present

## 2019-10-02 ENCOUNTER — Other Ambulatory Visit: Payer: Self-pay | Admitting: Cardiology

## 2019-10-02 DIAGNOSIS — E1165 Type 2 diabetes mellitus with hyperglycemia: Secondary | ICD-10-CM | POA: Diagnosis not present

## 2019-10-02 DIAGNOSIS — I10 Essential (primary) hypertension: Secondary | ICD-10-CM | POA: Diagnosis not present

## 2019-10-02 DIAGNOSIS — R69 Illness, unspecified: Secondary | ICD-10-CM | POA: Diagnosis not present

## 2019-10-02 DIAGNOSIS — Z299 Encounter for prophylactic measures, unspecified: Secondary | ICD-10-CM | POA: Diagnosis not present

## 2019-10-02 DIAGNOSIS — Z6828 Body mass index (BMI) 28.0-28.9, adult: Secondary | ICD-10-CM | POA: Diagnosis not present

## 2019-10-07 ENCOUNTER — Ambulatory Visit: Payer: 59

## 2019-10-09 ENCOUNTER — Ambulatory Visit: Payer: 59

## 2019-10-15 ENCOUNTER — Encounter: Payer: 59 | Admitting: Obstetrics and Gynecology

## 2019-10-23 ENCOUNTER — Telehealth: Payer: Self-pay

## 2019-10-23 NOTE — Telephone Encounter (Signed)
Do not think related to heart. Hope she has continue with her nutritional rehab, she had done well when she followed with me closely.

## 2019-10-23 NOTE — Telephone Encounter (Signed)
Pt called to informed us that she has been feeling tired and passing out. Pt mention she has some tightness on chest but she thinks it might be due to anxiety.  Pt denies dizzy, headaches, nor has she been sweating. Pt mention her bp was 94/60 pulse 73.

## 2019-10-27 NOTE — Telephone Encounter (Signed)
Called pt to inform her about the message above

## 2019-10-29 ENCOUNTER — Ambulatory Visit: Payer: Medicare HMO | Admitting: Cardiology

## 2019-10-29 ENCOUNTER — Other Ambulatory Visit: Payer: Self-pay

## 2019-10-29 ENCOUNTER — Ambulatory Visit (INDEPENDENT_AMBULATORY_CARE_PROVIDER_SITE_OTHER): Payer: 59 | Admitting: Cardiology

## 2019-10-29 VITALS — BP 132/69 | HR 72 | Temp 97.3°F | Ht 61.0 in | Wt 162.0 lb

## 2019-10-29 DIAGNOSIS — I5032 Chronic diastolic (congestive) heart failure: Secondary | ICD-10-CM

## 2019-10-29 DIAGNOSIS — I10 Essential (primary) hypertension: Secondary | ICD-10-CM

## 2019-10-29 DIAGNOSIS — E119 Type 2 diabetes mellitus without complications: Secondary | ICD-10-CM | POA: Diagnosis not present

## 2019-10-29 NOTE — Progress Notes (Signed)
Primary Physician/Referring:  Glenda Chroman, MD  Patient ID: [name] [name], female    DOB: Sep 15, 1960, 59 y.o.   MRN: [id number]  Chief Complaint  Patient presents with  . Hypotension   HPI:    [name] [name]  is a 59 y.o. Caucasian female with chronic diastolic heart failure, normal coronary arteries by angiography in 2005 and a normal stress echocardiogram in 2014, also negative nuclear stress test in 2018 and has hyperdynamic left ventricle with grade 1 diastolic dysfunction by echocardiogram in January 2020 presents for acute visit for 2 episodes of low blood pressure.    Patient previously evaluated by Dr. Harl Bowie, cardiology([facility]) but wanted to change.  Past medical history significant for panic attacks, anxiety, hypertension, hyperlipidemia, diabetes mellitus.  She has had 2 episodes where her BP dropped into the 90/60 range. She was asymptomatic, but concerned. Also had readings around 897 systolic and was concerned that this was to low. She denies any dizziness or syncope.  She is asking what she can do to help better control her diabetes.  She is a non-smoker.  No palpitations orchest pain.  Has started to walk on a regular basis.  Dyspnea is stable.     Past Medical History:  Diagnosis Date  . Abnormal EKG   . Allergy   . Anxiety    PTSD  . CHF (congestive heart failure) ([place])   . Depression   . Depression   . Diabetes mellitus without complication ([place])   . Dyspnea   . GERD (gastroesophageal reflux disease)   . Hyperlipidemia   . Hypertension    Unspecified  . IBS (irritable bowel syndrome)   . Kidney stone   . Panic disorder 03/20/2019  . Seizures ([place])    Reports last seizure was 30 + years ago   . Sleep apnea    no CPAP worn  . Stroke [facility])    Past Surgical History:  Procedure Laterality Date  . HYSTEROSCOPY  2001   submucous myomectomy  . TOTAL ABDOMINAL HYSTERECTOMY  2002   TAH BSO  pathology showed leiomyoma, adenomyosis, simple and complex  hyperplasia without atypia  . TUMOR REMOVAL     from left ear   Social History   Socioeconomic History  . Marital status: Married    Spouse name: Not on file  . Number of children: 0  . Years of education: Not on file  . Highest education level: Some college, no degree  Occupational History  . Occupation: Full Time    Comment: Sales  Tobacco Use  . Smoking status: Never Smoker  . Smokeless tobacco: Never Used  Substance and Sexual Activity  . Alcohol use: No    Alcohol/week: 0.0 standard drinks  . Drug use: No  . Sexual activity: Not Currently    Comment: 1st intercourse 59 yo-Fewer than 5 partners  Other Topics Concern  . Not on file  Social History Narrative   Right handed   Caffeine 1-2 cups daily    Lives at home with husband with 2 dogs    Regular exercise: No   Social Determinants of Health   Financial Resource Strain:   . Difficulty of Paying Living Expenses: Not on file  Food Insecurity:   . Worried About Charity fundraiser in the Last Year: Not on file  . Ran Out of Food in the Last Year: Not on file  Transportation Needs:   . Lack of Transportation (Medical): Not on file  . Lack  of Transportation (Non-Medical): Not on file  Physical Activity:   . Days of Exercise per Week: Not on file  . Minutes of Exercise per Session: Not on file  Stress:   . Feeling of Stress : Not on file  Social Connections:   . Frequency of Communication with Friends and Family: Not on file  . Frequency of Social Gatherings with Friends and Family: Not on file  . Attends Religious Services: Not on file  . Active Member of Clubs or Organizations: Not on file  . Attends Archivist Meetings: Not on file  . Marital Status: Not on file  Intimate Partner Violence:   . Fear of Current or Ex-Partner: Not on file  . Emotionally Abused: Not on file  . Physically Abused: Not on file  . Sexually Abused: Not on file   ROS  Review of Systems  Constitution: Negative for  chills, decreased appetite, malaise/fatigue and weight gain.  Cardiovascular: Positive for dyspnea on exertion and leg swelling (occasional). Negative for syncope.  Endocrine: Negative for cold intolerance.  Hematologic/Lymphatic: Does not bruise/bleed easily.  Musculoskeletal: Positive for joint pain. Negative for joint swelling.  Gastrointestinal: Negative for abdominal pain, anorexia, change in bowel habit, hematochezia and melena.  Neurological: Negative for headaches and light-headedness.  Psychiatric/Behavioral: Positive for depression. Negative for substance abuse. The patient is nervous/anxious.   All other systems reviewed and are negative.  Objective   Vitals with BMI 10/29/2019 08/04/2019 07/18/2019  Height 5' 1"  5' 1"  5' 1"   Weight 162 lbs 162 lbs 165 lbs  BMI 30.63 10.93 23.55  Systolic 732 202 542  Diastolic 69 79 59  Pulse 72 85 78    Blood pressure 132/69, pulse 72, temperature (!) 97.3 F (36.3 C), height 5' 1"  (1.549 m), weight 162 lb (73.5 kg), SpO2 96 %. Body mass index is 30.61 kg/m.   Physical Exam  Constitutional:  Short stature, mildly obese and in no acute distress.  HENT:  Head: Atraumatic.  Eyes: Conjunctivae are normal.  Neck: No JVD present. No thyromegaly present.  Cardiovascular: Normal rate, regular rhythm, normal heart sounds and intact distal pulses. Exam reveals no gallop.  No murmur heard. Pulmonary/Chest: Effort normal and breath sounds normal.  Abdominal: Soft. Bowel sounds are normal.  Musculoskeletal:        General: Normal range of motion.     Cervical back: Neck supple.  Neurological: She is alert.  Skin: Skin is warm and dry.  Psychiatric: She has a normal mood and affect.   Radiology: No results found.  Laboratory examination:   Labs 06/12/2019: Serum glucose 212 mg, BUN 15, creatinine 0.77, eGFR >60 mL, potassium 4.1, CMP otherwise normal. Total cholesterol 182, triglycerides 426, HDL 28, LDL 85. CBC normal, TSH  normal.  Recent Labs    01/22/19 1024 02/19/19 1600 08/04/19 1234  NA 138 140 134*  K 3.6 3.4* 3.7  CL 101 100 102  CO2 25 27 21*  GLUCOSE 148* 206* 322*  BUN 21* 18 14  CREATININE 0.71 0.78 0.90  CALCIUM 9.7 9.5 9.4  GFRNONAA >60 >60 >60  GFRAA >60 >60 >60   CMP Latest Ref Rng & Units 08/04/2019 02/19/2019 01/22/2019  Glucose 70 - 99 mg/dL 322(H) 206(H) 148(H)  BUN 6 - 20 mg/dL 14 18 21(H)  Creatinine 0.44 - 1.00 mg/dL 0.90 0.78 0.71  Sodium 135 - 145 mmol/L 134(L) 140 138  Potassium 3.5 - 5.1 mmol/L 3.7 3.4(L) 3.6  Chloride 98 - 111 mmol/L 102  100 101  CO2 22 - 32 mmol/L 21(L) 27 25  Calcium 8.9 - 10.3 mg/dL 9.4 9.5 9.7  Total Protein 6.5 - 8.1 g/dL - - -  Total Bilirubin 0.3 - 1.2 mg/dL - - -  Alkaline Phos 38 - 126 U/L - - -  AST 15 - 41 U/L - - -  ALT 0 - 44 U/L - - -   CBC Latest Ref Rng & Units 08/04/2019 11/01/2018 12/28/2017  WBC 4.0 - 10.5 K/uL 5.5 7.1 9.7  Hemoglobin 12.0 - 15.0 g/dL 12.5 12.3 13.6  Hematocrit 36.0 - 46.0 % 37.9 40.0 41.7  Platelets 150 - 400 K/uL 169 189 257   Lipid Panel  No results found for: CHOL, TRIG, HDL, CHOLHDL, VLDL, LDLCALC, LDLDIRECT HEMOGLOBIN A1C No results found for: HGBA1C, MPG TSH No results for input(s): TSH in the last 8760 hours. Medications   Prior to Admission medications   Medication Sig Start Date End Date Taking? Authorizing Provider  ALPRAZolam Duanne Moron) 0.5 MG tablet Take 0.5 mg by mouth at bedtime as needed for anxiety.   Yes [provider]  ALPRAZolam Duanne Moron) 1 MG tablet Take 1 mg by mouth 3 (three) times daily.   Yes [provider]  aspirin 81 MG tablet Take 81 mg by mouth at bedtime.    Yes [provider]  buPROPion (WELLBUTRIN SR) 150 MG 12 hr tablet Take 150 mg by mouth daily.   Yes [provider]  citalopram (CELEXA) 40 MG tablet Take 40 mg by mouth daily.   Yes [provider]  furosemide (LASIX) 20 MG tablet Take 20 mg daily, may take an extra 20 mg if  weight is over 159 lbs , or, for significant swelling 05/21/19  Yes Branch, Alphonse Guild, MD  gabapentin (NEURONTIN) 400 MG capsule Take 400 mg by mouth 2 (two) times daily.   Yes [provider]  glimepiride (AMARYL) 2 MG tablet Take 2 mg by mouth daily with breakfast.    Yes [provider]  lisinopril (ZESTRIL) 5 MG tablet Take 5 mg by mouth daily.   Yes [provider]  metoprolol succinate (TOPROL-XL) 50 MG 24 hr tablet Take 50 mg by mouth daily. 05/16/19  Yes [provider]  omeprazole (PRILOSEC) 20 MG capsule Take 20 mg by mouth at bedtime.  07/02/14  Yes [provider]  potassium chloride SA (K-DUR) 20 MEQ tablet Take 2 tablets (40 mEq total) by mouth daily. 02/21/19  Yes Arnoldo Lenis, MD  pravastatin (PRAVACHOL) 20 MG tablet TAKE 1 TABLET BY MOUTH EVERY DAY IN THE EVENING 03/26/19  Yes Branch, Alphonse Guild, MD  Semaglutide,0.25 or 0.5MG/DOS, (OZEMPIC, 0.25 OR 0.5 MG/DOSE,) 2 MG/1.5ML SOPN Inject 2.5 mLs into the skin every Wednesday.  08/14/18  Yes [provider]  verapamil (CALAN-SR) 120 MG CR tablet Take 120 mg by mouth at bedtime.  07/02/14  Yes [provider]     Current Outpatient Medications  Medication Instructions  . ALPRAZolam (XANAX) 1 mg, Oral, 3 times daily PRN  . ALPRAZolam (XANAX) 0.5 mg, Oral, Daily at bedtime  . aspirin 81 mg, Oral, Daily at bedtime  . buPROPion (WELLBUTRIN SR) 150 mg, Oral, Daily  . citalopram (CELEXA) 40 mg, Oral, Daily  . furosemide (LASIX) 20 MG tablet Twice daily as needed if weight >3 Lbs  . gabapentin (NEURONTIN) 400 mg, Oral, 2 times daily  . glimepiride (AMARYL) 2 mg, Oral, Daily with breakfast  . lisinopril (ZESTRIL) 5 mg, Oral,  Daily  . metoprolol succinate (TOPROL-XL) 50 mg, Oral, Daily  . omeprazole (PRILOSEC) 20 mg, Oral, Daily at bedtime  . Potassium Chloride ER 20 MEQ TBCR TAKE 2 TABLETS BY MOUTH DAILY  . potassium chloride SA (K-DUR) 20 MEQ tablet 40 mEq, Oral, Daily PRN,  With Furosemide  . pravastatin (PRAVACHOL) 20 MG tablet TAKE 1 TABLET BY MOUTH EVERY DAY IN THE EVENING  . Semaglutide,0.25 or 0.5MG/DOS, (OZEMPIC, 0.25 OR 0.5 MG/DOSE,) 2 MG/1.5ML SOPN 2.5 mLs, Subcutaneous, Every Wed  . spironolactone (ALDACTONE) 50 mg, Oral, BH-each morning  . verapamil (CALAN-SR) 120 mg, Oral, Daily at bedtime    Cardiac Studies:   Lexiscan Myoview Stress Test 02/23/2017:  Blood pressure demonstrated a hypertensive response to exercise.  There was no ST segment deviation noted during stress.  The study is normal.  This is a low risk study.  Nuclear stress EF: 85%   Echocardiogram 09/25/2018:   Left ventricle: The cavity size was normal. Wall thickness was   increased in a pattern of mild LVH. Systolic function was normal.   The estimated ejection fraction was in the range of 60% to 65%.   Wall motion was normal; there were no regional wall motion   abnormalities. Doppler parameters are consistent with abnormal   left ventricular relaxation (grade 1 diastolic dysfunction). - Aortic valve: Mildly calcified annulus. Trileaflet. - Mitral valve: Mildly calcified annulus. There was trivial   regurgitation. - Right atrium: Central venous pressure (est): 3 mm Hg. - Atrial septum: There was increased thickness of the septum,   consistent with lipomatous hypertrophy. - Tricuspid valve: There was trivial regurgitation. - Pulmonary arteries: Systolic pressure could not be accurately   estimated. - Pericardium, extracardiac: A prominent pericardial fat pad was   present. Cannot exclude small to moderate sized, organized   circumferential pericardial effusion. Could consider CT imaging.  Carotid artery duplex 01/16/2019:  Bilateral 1- 39% stenosis.  Antegrade bilateral vertebral flow.  Assessment     ICD-10-CM   1. Essential hypertension  I10   2. Chronic diastolic heart failure (HCC)  I50.32   3. Type 2 diabetes mellitus without complication, without long-term  current use of insulin (HCC)  E11.9 Ambulatory referral to Nutrition and Diabetic Education    EKG 05/29/2019: Normal sinus rhythm at rate of 80 bpm, left atrial enlargement, left axis deviation, left anterior fascicular block.  Incomplete right bundle branch block.  LVH with repolarization abnormality, cannot exclude high lateral ischemia.  Abnormal EKG. No significant change from  10/31/2018.  Recommendations:   No orders of the defined types were placed in this encounter.   [name] [name]  is a Caucasian female with chronic diastolic heart failure, normal coronary arteries by angiography in 2005 and a normal stress echocardiogram in 2014, also negative nuclear stress test in 2018 and has hyperdynamic left ventricle with grade 1 diastolic dysfunction by echocardiogram in January 2020 presents for follow-up on episodes of hypotension.   She has had 2 episodes where her BP dropped into the 90/60 range. She was asymptomatic, but concerned. Also had readings around 093 systolic and was concerned that this was to low. She denies any dizziness or syncope. I have reviewed her blood pressure readings, BP systolic readings consistently in the 120's.  Blood pressure is well controlled.  In view of only 2 episodes of low blood pressure that were asymptomatic, do not feel that we should make further medication changes at this time.  I have advised her that should she have  episodes of low blood pressure again, to increase her fluid intake.  I have advised her on the importance of staying well-hydrated, but also to not over hydrate in view of her diastolic dysfunction.  If she continues to have frequent episodes of low blood pressure, may need to make further reductions in her medications.    Encouraged her to continue to work on dietary changes to promote weight loss and also to help with better diabetes control. She is asking what she can do to help better control her diabetes.  I will place referral to diabetes  educator to help her with individualized diet plan with her diabetes.  Diabetes is being managed by her PCP.  We will plan to see her back as previously scheduled in May, but encouraged her to contact us sooner if needed.   Miquel Dunn, MSN, APRN, FNP-C [facility]. [place] Office: [phone] Fax: [phone]

## 2019-11-10 ENCOUNTER — Telehealth: Payer: Self-pay

## 2019-11-25 ENCOUNTER — Ambulatory Visit: Payer: Medicare HMO | Admitting: Registered"

## 2019-12-23 ENCOUNTER — Other Ambulatory Visit: Payer: Self-pay | Admitting: Cardiology

## 2019-12-23 DIAGNOSIS — I5032 Chronic diastolic (congestive) heart failure: Secondary | ICD-10-CM

## 2019-12-23 DIAGNOSIS — I10 Essential (primary) hypertension: Secondary | ICD-10-CM

## 2019-12-29 ENCOUNTER — Other Ambulatory Visit: Payer: Self-pay

## 2019-12-29 DIAGNOSIS — E782 Mixed hyperlipidemia: Secondary | ICD-10-CM

## 2019-12-29 MED ORDER — PRAVASTATIN SODIUM 20 MG PO TABS: 20.0000 mg | ORAL_TABLET | Freq: Every day | ORAL | 0 refills | Status: DC

## 2019-12-30 DIAGNOSIS — H35033 Hypertensive retinopathy, bilateral: Secondary | ICD-10-CM | POA: Diagnosis not present

## 2019-12-30 DIAGNOSIS — E119 Type 2 diabetes mellitus without complications: Secondary | ICD-10-CM | POA: Diagnosis not present

## 2019-12-30 DIAGNOSIS — H43821 Vitreomacular adhesion, right eye: Secondary | ICD-10-CM | POA: Diagnosis not present

## 2019-12-30 DIAGNOSIS — D3132 Benign neoplasm of left choroid: Secondary | ICD-10-CM | POA: Diagnosis not present

## 2020-01-09 ENCOUNTER — Telehealth: Payer: Self-pay

## 2020-01-09 NOTE — Telephone Encounter (Signed)
error 

## 2020-01-09 NOTE — Telephone Encounter (Signed)
The patient says that she has gained 4 pounds in the last two days and has some SOB. She took two 20 mg lasix and potassium today.  Please advise.

## 2020-01-14 NOTE — Progress Notes (Signed)
Primary Physician/Referring:  Glenda Chroman, MD  Patient ID: [name] [name], female    DOB: Jan 22, 1961, 59 y.o.   MRN: [id number]  Chief Complaint  Patient presents with  . Hypertension  . Congestive Heart Failure  . Follow-up    6 month   HPI:    [name] [name]  is a 59 y.o. Caucasian female with chronic diastolic heart failure, normal coronary arteries by angiography in 2005 and a normal stress echocardiogram in 2014, also negative nuclear stress test in 2018 and has hyperdynamic left ventricle with grade 1 diastolic dysfunction by echocardiogram in January 2020, Presents for a six-month office visit and also what to be evaluated since she had dizziness after she had taken extra dose of furosemide.  She had gained 6 pounds in weight and had to use extra Lasix about a week ago.  She is advised to hold lisinopril and symptoms of dizziness has subsided.  Today she is asymptomatic except for chronic dyspnea.  Denies PND or orthopnea.  Weight is gone back to baseline.  No chest pain, palpitations or syncope.  Past Medical History:  Diagnosis Date  . Abnormal EKG   . Allergy   . Anxiety    PTSD  . CHF (congestive heart failure) ([place])   . Depression   . Depression   . Diabetes mellitus without complication ([place])   . Dyspnea   . GERD (gastroesophageal reflux disease)   . Hyperlipidemia   . Hypertension    Unspecified  . IBS (irritable bowel syndrome)   . Kidney stone   . Panic disorder 03/20/2019  . Seizures ([place])    Reports last seizure was 30 + years ago   . Sleep apnea    no CPAP worn  . Stroke [facility])    Past Surgical History:  Procedure Laterality Date  . HYSTEROSCOPY  2001   submucous myomectomy  . TOTAL ABDOMINAL HYSTERECTOMY  2002   TAH BSO  pathology showed leiomyoma, adenomyosis, simple and complex hyperplasia without atypia  . TUMOR REMOVAL     from left ear   Family History  Problem Relation Age of Onset  . Cancer Father        Prostate  . High blood pressure  Father   . Coronary artery disease Paternal Grandfather   . Uterine cancer Mother   . Irritable bowel syndrome Mother   . Stroke Mother   . Osteoporosis Mother   . Heart disease Maternal Grandmother   . Heart attack Brother 71  . Cancer Brother        Prostate  . Colitis Neg Hx   . Esophageal cancer Neg Hx   . Stomach cancer Neg Hx   . Rectal cancer Neg Hx     Social History   Tobacco Use  . Smoking status: Never Smoker  . Smokeless tobacco: Never Used  Substance Use Topics  . Alcohol use: No    Alcohol/week: 0.0 standard drinks   Marital Status: Married  ROS  Review of Systems  Cardiovascular: Positive for dyspnea on exertion. Negative for chest pain and leg swelling.  Musculoskeletal: Positive for joint pain.  Gastrointestinal: Negative for melena.   Objective  Blood pressure 116/70, pulse 67, temperature 97.7 F (36.5 C), temperature source Temporal, resp. rate 16, height 5' 1"  (1.549 m), weight 166 lb 8 oz (75.5 kg), SpO2 97 %.  Vitals with BMI 01/15/2020 10/29/2019 08/04/2019  Height 5' 1"  5' 1"  5' 1"   Weight 166 lbs 8 oz 162  lbs 162 lbs  BMI 31.48 36.14 43.15  Systolic 400 867 619  Diastolic 70 69 79  Pulse 67 72 85     Physical Exam  Constitutional:  Short stature, mildly obese and in no acute distress.   HENT:  Head: Atraumatic.  Cardiovascular: Normal rate, regular rhythm and intact distal pulses. Exam reveals no gallop.  No murmur heard. No leg edema. No JVD.    Pulmonary/Chest: Effort normal and breath sounds normal. No accessory muscle usage. No respiratory distress.  Abdominal: Soft.   Laboratory examination:   Recent Labs    01/22/19 1024 02/19/19 1600 08/04/19 1234  NA 138 140 134*  K 3.6 3.4* 3.7  CL 101 100 102  CO2 25 27 21*  GLUCOSE 148* 206* 322*  BUN 21* 18 14  CREATININE 0.71 0.78 0.90  CALCIUM 9.7 9.5 9.4  GFRNONAA >60 >60 >60  GFRAA >60 >60 >60   CrCl cannot be calculated (Patient's most recent lab result is older than the  maximum 21 days allowed.).  CMP Latest Ref Rng & Units 08/04/2019 02/19/2019 01/22/2019  Glucose 70 - 99 mg/dL 322(H) 206(H) 148(H)  BUN 6 - 20 mg/dL 14 18 21(H)  Creatinine 0.44 - 1.00 mg/dL 0.90 0.78 0.71  Sodium 135 - 145 mmol/L 134(L) 140 138  Potassium 3.5 - 5.1 mmol/L 3.7 3.4(L) 3.6  Chloride 98 - 111 mmol/L 102 100 101  CO2 22 - 32 mmol/L 21(L) 27 25  Calcium 8.9 - 10.3 mg/dL 9.4 9.5 9.7  Total Protein 6.5 - 8.1 g/dL - - -  Total Bilirubin 0.3 - 1.2 mg/dL - - -  Alkaline Phos 38 - 126 U/L - - -  AST 15 - 41 U/L - - -  ALT 0 - 44 U/L - - -   CBC Latest Ref Rng & Units 08/04/2019 11/01/2018 12/28/2017  WBC 4.0 - 10.5 K/uL 5.5 7.1 9.7  Hemoglobin 12.0 - 15.0 g/dL 12.5 12.3 13.6  Hematocrit 36.0 - 46.0 % 37.9 40.0 41.7  Platelets 150 - 400 K/uL 169 189 257   Lipid Panel  No results found for: CHOL, TRIG, HDL, CHOLHDL, VLDL, LDLCALC, LDLDIRECT HEMOGLOBIN A1C No results found for: HGBA1C, MPG TSH No results for input(s): TSH in the last 8760 hours.  External labs:   08/04/2019: eGFR 60. WBC 5.5, RBC 4.2, H/H 37.9/12.5, PLT 169.  TSH 2.596 09/25/2018  10/29/2018: AST 24.5, ALT 29, BUN 12, Creatinine 0.67, Sodium 137, Potassium 3.1, eGFR If NonAfricn Am >60  04/05/2018: Lipid profile: Cholesterol 196, HDL 29, Triglycerides 382, VLDL 76, LDL 90 Hemoglobin A1C 7.9.  TSH 2.28  Medications and allergies   Allergies  Allergen Reactions  . Codeine Rash  . Doxycycline Rash  . Erythromycin Rash     Current Outpatient Medications  Medication Instructions  . ALPRAZolam (XANAX) 1 mg, Oral, 3 times daily PRN  . ALPRAZolam (XANAX) 0.5 mg, Oral, Daily at bedtime  . aspirin 81 mg, Oral, Daily at bedtime  . buPROPion (WELLBUTRIN SR) 150 mg, Oral, Daily  . citalopram (CELEXA) 40 mg, Oral, Daily  . furosemide (LASIX) 20 MG tablet Twice daily as needed if weight >3 Lbs  . gabapentin (NEURONTIN) 400 mg, Oral, 2 times daily  . glimepiride (AMARYL) 2 mg, Oral, Daily with breakfast   . lisinopril (ZESTRIL) 5 mg, Oral, Daily  . metoprolol succinate (TOPROL-XL) 50 mg, Oral, Daily  . omeprazole (PRILOSEC) 20 mg, Oral, Daily at bedtime  . potassium chloride SA (K-DUR) 20 MEQ tablet 40  mEq, Oral, Daily PRN, With Furosemide  . pravastatin (PRAVACHOL) 20 mg, Oral, Daily  . Semaglutide,0.25 or 0.5MG/DOS, (OZEMPIC, 0.25 OR 0.5 MG/DOSE,) 2 MG/1.5ML SOPN 2.5 mLs, Subcutaneous, Every Wed  . spironolactone (ALDACTONE) 50 MG tablet TAKE 1 TABLET BY MOUTH EVERY MORNING  . verapamil (CALAN-SR) 120 mg, Oral, Daily at bedtime   Radiology:   No results found.  Cardiac Studies:   Lexiscan Myoview Stress Test 02/23/2017: Blood pressure demonstrated a hypertensive response to exercise. There was no ST segment deviation noted during stress. The study is normal. This is a low risk study. Nuclear stress EF: 85%  Echocardiogram 09/25/2018:   Left ventricle: The cavity size was normal. Wall thickness was  increased in a pattern of mild LVH. Systolic function was normal. The estimated ejection fraction was in the range of 60% to 65%.   Wall motion was normal; there were no regional wall motion  abnormalities. Doppler parameters are consistent with abnormal   left ventricular relaxation (grade 1 diastolic dysfunction). - Aortic valve: Mildly calcified annulus. Trileaflet. - Mitral valve: Mildly calcified annulus. There was trivial   regurgitation. - Right atrium: Central venous pressure (est): 3 mm Hg. - Atrial septum: There was increased thickness of the septum,   consistent with lipomatous hypertrophy. - Tricuspid valve: There was trivial regurgitation. - Pulmonary arteries: Systolic pressure could not be accurately   estimated. - Pericardium, extracardiac: A prominent pericardial fat pad was   present. Cannot exclude small to moderate sized, organized   circumferential pericardial effusion. Could consider CT imaging.  Carotid artery duplex 01/16/2019:  Bilateral 1- 39% stenosis.   Antegrade bilateral vertebral flow.  EKG  EKG 01/15/2020: Normal sinus rhythm at rate of 69 bpm, normal axis, early repolarization.  No evidence of ischemia.  Isolated T wave inversion in aVL.  Compared to 05/29/2019, left axis deviation not present.   Assessment     ICD-10-CM   1. Chronic diastolic heart failure (HCC)  I50.32   2. Dyspnea on exertion  R06.00   3. Essential hypertension  I10 EKG 12-Lead  4. Type 2 diabetes mellitus without complication, without long-term current use of insulin (HCC)  E11.9     No orders of the defined types were placed in this encounter.   There are no discontinued medications.  Recommendations:   [name] [name]  is a  59 y.o. Caucasian female with chronic diastolic heart failure, normal coronary arteries by angiography in 2005 and a normal stress echocardiogram in 2014, also negative nuclear stress test in 2018 and has hyperdynamic left ventricle with grade 1 diastolic dysfunction by echocardiogram in January 2020 presents for follow-up on episodes of hypotension, dizziness and dyspnea.   Patient had called my partner on the weekend for episode of hypotension following taking 2 extra doses of Lasix for a 6 pound weight gain.  She held lisinopril with improvement in symptoms, today she feels well, dyspnea is improved, weight loss has been maintained on the past 3 days.  She has not had any furtherDizziness.  Today on physical exam, there is no clinical evidence of heart failure.  There is no JVD, there is no leg edema.  Hence advised her that if she has to take extra dose of furosemide, to hold off on lisinopril the following day.  With regard to diabetes, we discussed regarding making lifestyle changes again, she is planning on going on a "Keto diet".Encouraged her to have a sustained plant.  Daily exercise and walking for at least 30 minutes  discussed.  Blood pressure is well controlled, lipids being managed by her PCP and at goal previously.  I will see  her back in 6 months for follow-up.  Adrian Prows, MD, [facility] 01/15/2020, 12:31 PM [facility]. [place] Pager: [phone] Office: [phone]

## 2020-01-15 ENCOUNTER — Other Ambulatory Visit: Payer: Self-pay

## 2020-01-15 ENCOUNTER — Ambulatory Visit: Payer: 59 | Admitting: Cardiology

## 2020-01-15 ENCOUNTER — Encounter: Payer: Self-pay | Admitting: Cardiology

## 2020-01-15 VITALS — BP 116/70 | HR 67 | Temp 97.7°F | Resp 16 | Ht 61.0 in | Wt 166.5 lb

## 2020-01-15 DIAGNOSIS — I10 Essential (primary) hypertension: Secondary | ICD-10-CM | POA: Diagnosis not present

## 2020-01-15 DIAGNOSIS — R0609 Other forms of dyspnea: Secondary | ICD-10-CM

## 2020-01-15 DIAGNOSIS — I5032 Chronic diastolic (congestive) heart failure: Secondary | ICD-10-CM | POA: Diagnosis not present

## 2020-01-15 DIAGNOSIS — E119 Type 2 diabetes mellitus without complications: Secondary | ICD-10-CM | POA: Diagnosis not present

## 2020-01-15 DIAGNOSIS — R0600 Dyspnea, unspecified: Secondary | ICD-10-CM

## 2020-01-18 ENCOUNTER — Other Ambulatory Visit: Payer: Self-pay | Admitting: Cardiology

## 2020-01-18 DIAGNOSIS — I10 Essential (primary) hypertension: Secondary | ICD-10-CM

## 2020-01-18 DIAGNOSIS — I5032 Chronic diastolic (congestive) heart failure: Secondary | ICD-10-CM

## 2020-01-19 DIAGNOSIS — R69 Illness, unspecified: Secondary | ICD-10-CM | POA: Diagnosis not present

## 2020-01-19 DIAGNOSIS — F410 Panic disorder [episodic paroxysmal anxiety] without agoraphobia: Secondary | ICD-10-CM | POA: Diagnosis not present

## 2020-01-19 DIAGNOSIS — F4312 Post-traumatic stress disorder, chronic: Secondary | ICD-10-CM | POA: Diagnosis not present

## 2020-01-20 ENCOUNTER — Encounter: Payer: Self-pay | Admitting: Neurology

## 2020-01-20 ENCOUNTER — Ambulatory Visit (INDEPENDENT_AMBULATORY_CARE_PROVIDER_SITE_OTHER): Payer: 59 | Admitting: Neurology

## 2020-01-20 VITALS — BP 124/72 | HR 63 | Temp 96.8°F | Ht 61.0 in | Wt 165.6 lb

## 2020-01-20 DIAGNOSIS — F410 Panic disorder [episodic paroxysmal anxiety] without agoraphobia: Secondary | ICD-10-CM | POA: Diagnosis not present

## 2020-01-20 DIAGNOSIS — R69 Illness, unspecified: Secondary | ICD-10-CM | POA: Diagnosis not present

## 2020-01-20 NOTE — Progress Notes (Signed)
Reason for visit: Panic disorder  [name] [name] is an 59 y.o. female  History of present illness:  [name] [name] is a 59 year old right-handed white female with a history of diabetes and congestive heart failure.  The patient has a history of significant anxiety problems with a panic disorder.  The patient in the past has presented with total body numbness and speech changes.  Cerebrovascular work-up and EEG studies were unremarkable.  The patient has done much better with an adjustment of her Xanax dosing, she is followed by Noemi Chapel from psychiatry.  The patient has had 2 events since January, the last event was 2 months ago.  The patient had 30 minutes of problems with discomfort from the elbows down bilaterally and tremors of both arms.  The patient would wrap her self up in a blanket and take a Xanax and the episode would resolve in about 30 minutes.  The patient never lost consciousness, she did not have any diaphoresis, speech changes or vision changes.  She never checked her blood sugar around the time of these events.  She had no tremor involving the legs.  The patient has done quite well for the last 2 months.  She was not sleeping well around the time of these events but now is doing better by using Xanax for sleep.  The patient is having some fatigue in the afternoon but has good energy levels during the morning.  She has been followed through ophthalmology for a freckle on the left eye.  She returns here for an evaluation.  Past Medical History:  Diagnosis Date  . Abnormal EKG   . Allergy   . Anxiety    PTSD  . CHF (congestive heart failure) ([place])   . Depression   . Depression   . Diabetes mellitus without complication ([place])   . Dyspnea   . GERD (gastroesophageal reflux disease)   . Hyperlipidemia   . Hypertension    Unspecified  . IBS (irritable bowel syndrome)   . Kidney stone   . Panic disorder 03/20/2019  . Seizures ([place])    Reports last seizure was 30 + years ago   .  Sleep apnea    no CPAP worn  . Stroke [facility])     Past Surgical History:  Procedure Laterality Date  . HYSTEROSCOPY  2001   submucous myomectomy  . TOTAL ABDOMINAL HYSTERECTOMY  2002   TAH BSO  pathology showed leiomyoma, adenomyosis, simple and complex hyperplasia without atypia  . TUMOR REMOVAL     from left ear    Family History  Problem Relation Age of Onset  . Cancer Father        Prostate  . High blood pressure Father   . Coronary artery disease Paternal Grandfather   . Uterine cancer Mother   . Irritable bowel syndrome Mother   . Stroke Mother   . Osteoporosis Mother   . Heart disease Maternal Grandmother   . Heart attack Brother 45  . Cancer Brother        Prostate  . Colitis Neg Hx   . Esophageal cancer Neg Hx   . Stomach cancer Neg Hx   . Rectal cancer Neg Hx     Social history:  reports that she has never smoked. She has never used smokeless tobacco. She reports that she does not drink alcohol or use drugs.    Allergies  Allergen Reactions  . Codeine Rash  . Doxycycline Rash  . Erythromycin Rash  Medications:  Prior to Admission medications   Medication Sig Start Date End Date Taking? Authorizing Provider  ALPRAZolam Duanne Moron) 0.5 MG tablet Take 0.5 mg by mouth at bedtime.    Yes [provider]  ALPRAZolam Duanne Moron) 1 MG tablet Take 1 mg by mouth 3 (three) times daily as needed.    Yes [provider]  aspirin 81 MG tablet Take 81 mg by mouth at bedtime.    Yes [provider]  buPROPion (WELLBUTRIN SR) 150 MG 12 hr tablet Take 150 mg by mouth daily.   Yes [provider]  citalopram (CELEXA) 40 MG tablet Take 40 mg by mouth daily.   Yes [provider]  furosemide (LASIX) 20 MG tablet Twice daily as needed if weight >3 Lbs 05/29/19  Yes Adrian Prows, MD  gabapentin (NEURONTIN) 400 MG capsule Take 400 mg by mouth 2 (two) times daily.   Yes [provider]  glimepiride (AMARYL) 2 MG tablet Take 2 mg by  mouth daily with breakfast.    Yes [provider]  lisinopril (ZESTRIL) 5 MG tablet Take 5 mg by mouth daily.   Yes [provider]  metoprolol succinate (TOPROL-XL) 50 MG 24 hr tablet Take 1 tablet (50 mg total) by mouth daily. 07/18/19  Yes Adrian Prows, MD  omeprazole (PRILOSEC) 20 MG capsule Take 20 mg by mouth at bedtime.  07/02/14  Yes [provider]  potassium chloride SA (K-DUR) 20 MEQ tablet Take 2 tablets (40 mEq total) by mouth daily as needed. With Furosemide 05/29/19  Yes Adrian Prows, MD  pravastatin (PRAVACHOL) 20 MG tablet Take 1 tablet (20 mg total) by mouth daily. 12/29/19  Yes Adrian Prows, MD  Semaglutide,0.25 or 0.5MG /DOS, (OZEMPIC, 0.25 OR 0.5 MG/DOSE,) 2 MG/1.5ML SOPN Inject 2.5 mLs into the skin every Wednesday.  08/14/18  Yes [provider]  spironolactone (ALDACTONE) 50 MG tablet TAKE 1 TABLET BY MOUTH EVERY MORNING 01/19/20  Yes Adrian Prows, MD  verapamil (CALAN-SR) 120 MG CR tablet Take 120 mg by mouth at bedtime.  07/02/14  Yes [provider]  Fluoxetine HCl, PMDD, 20 MG TABS Take by mouth.    [provider]  glimepiride (AMARYL) 4 MG tablet Take 4 mg by mouth daily. 01/14/20   [provider]    ROS:  Out of a complete 14 system review of symptoms, the patient complains only of the following symptoms, and all other reviewed systems are negative.  Intermittent arm tremor Anxiety  Blood pressure 124/72, pulse 63, temperature (!) 96.8 F (36 C), height 5\' 1"  (1.549 m), weight 165 lb 9.6 oz (75.1 kg).  Physical Exam  General: The patient is alert and cooperative at the time of the examination.  The patient is moderately obese.  Skin: No significant peripheral edema is noted.   Neurologic Exam  Mental status: The patient is alert and oriented x 3 at the time of the examination. The patient has apparent normal recent and remote memory, with an apparently normal attention span and concentration  ability.   Cranial nerves: Facial symmetry is present. Speech is normal, no aphasia or dysarthria is noted. Extraocular movements are full. Visual fields are full.  Motor: The patient has good strength in all 4 extremities.  Sensory examination: Soft touch sensation is symmetric on the face, arms, and legs.  Coordination: The patient has good finger-nose-finger and heel-to-shin bilaterally.  Gait and station: The patient has a normal gait. Tandem gait is normal. Romberg is negative. No  drift is seen.  Reflexes: Deep tendon reflexes are symmetric.   Assessment/Plan:  1.  Panic disorder  2.  Diabetes  The patient likely is having occasional panic attacks.  She tends to have significant somatic symptoms with these events.  I have indicated that if she has another event of arm tremors, she should check a blood sugar to ensure that she is not hypoglycemic.  Otherwise, I would not pursue further work-up at this time.  She will follow-up here if needed.  The described episodes are not consistent with seizures or TIA.  Jill Alexanders MD 01/20/2020 8:32 AM  [facility] [address] [place], [place] [address]  Phone [phone] Fax [phone]

## 2020-02-03 ENCOUNTER — Other Ambulatory Visit: Payer: Self-pay | Admitting: Cardiology

## 2020-02-03 DIAGNOSIS — R69 Illness, unspecified: Secondary | ICD-10-CM | POA: Diagnosis not present

## 2020-02-03 DIAGNOSIS — I5032 Chronic diastolic (congestive) heart failure: Secondary | ICD-10-CM

## 2020-02-03 DIAGNOSIS — Z299 Encounter for prophylactic measures, unspecified: Secondary | ICD-10-CM | POA: Diagnosis not present

## 2020-02-03 DIAGNOSIS — I10 Essential (primary) hypertension: Secondary | ICD-10-CM | POA: Diagnosis not present

## 2020-02-03 DIAGNOSIS — E118 Type 2 diabetes mellitus with unspecified complications: Secondary | ICD-10-CM | POA: Diagnosis not present

## 2020-02-05 ENCOUNTER — Other Ambulatory Visit: Payer: Self-pay | Admitting: Cardiology

## 2020-02-05 DIAGNOSIS — I10 Essential (primary) hypertension: Secondary | ICD-10-CM

## 2020-02-07 ENCOUNTER — Encounter (HOSPITAL_COMMUNITY): Payer: Self-pay | Admitting: Emergency Medicine

## 2020-02-07 ENCOUNTER — Encounter: Payer: Self-pay | Admitting: Cardiology

## 2020-02-07 ENCOUNTER — Other Ambulatory Visit: Payer: Self-pay

## 2020-02-07 ENCOUNTER — Inpatient Hospital Stay (HOSPITAL_COMMUNITY)
Admission: EM | Admit: 2020-02-07 | Discharge: 2020-02-10 | DRG: 641 | Disposition: A | Discharge status: Home / Self Care | Payer: 59 | Attending: Internal Medicine | Admitting: Internal Medicine

## 2020-02-07 DIAGNOSIS — N182 Chronic kidney disease, stage 2 (mild): Secondary | ICD-10-CM | POA: Diagnosis present

## 2020-02-07 DIAGNOSIS — K219 Gastro-esophageal reflux disease without esophagitis: Secondary | ICD-10-CM

## 2020-02-07 DIAGNOSIS — E6609 Other obesity due to excess calories: Secondary | ICD-10-CM

## 2020-02-07 DIAGNOSIS — Z20822 Contact with and (suspected) exposure to covid-19: Secondary | ICD-10-CM | POA: Diagnosis present

## 2020-02-07 DIAGNOSIS — E669 Obesity, unspecified: Secondary | ICD-10-CM | POA: Diagnosis present

## 2020-02-07 DIAGNOSIS — Z7982 Long term (current) use of aspirin: Secondary | ICD-10-CM

## 2020-02-07 DIAGNOSIS — F410 Panic disorder [episodic paroxysmal anxiety] without agoraphobia: Secondary | ICD-10-CM | POA: Diagnosis present

## 2020-02-07 DIAGNOSIS — Z823 Family history of stroke: Secondary | ICD-10-CM

## 2020-02-07 DIAGNOSIS — Z6831 Body mass index (BMI) 31.0-31.9, adult: Secondary | ICD-10-CM

## 2020-02-07 DIAGNOSIS — E875 Hyperkalemia: Secondary | ICD-10-CM | POA: Diagnosis not present

## 2020-02-07 DIAGNOSIS — I5032 Chronic diastolic (congestive) heart failure: Secondary | ICD-10-CM

## 2020-02-07 DIAGNOSIS — Z8673 Personal history of transient ischemic attack (TIA), and cerebral infarction without residual deficits: Secondary | ICD-10-CM

## 2020-02-07 DIAGNOSIS — Z881 Allergy status to other antibiotic agents status: Secondary | ICD-10-CM

## 2020-02-07 DIAGNOSIS — Z885 Allergy status to narcotic agent status: Secondary | ICD-10-CM

## 2020-02-07 DIAGNOSIS — F329 Major depressive disorder, single episode, unspecified: Secondary | ICD-10-CM | POA: Diagnosis present

## 2020-02-07 DIAGNOSIS — Z8249 Family history of ischemic heart disease and other diseases of the circulatory system: Secondary | ICD-10-CM

## 2020-02-07 DIAGNOSIS — N179 Acute kidney failure, unspecified: Secondary | ICD-10-CM

## 2020-02-07 DIAGNOSIS — Z888 Allergy status to other drugs, medicaments and biological substances status: Secondary | ICD-10-CM

## 2020-02-07 DIAGNOSIS — Z7984 Long term (current) use of oral hypoglycemic drugs: Secondary | ICD-10-CM

## 2020-02-07 DIAGNOSIS — E1122 Type 2 diabetes mellitus with diabetic chronic kidney disease: Secondary | ICD-10-CM | POA: Diagnosis present

## 2020-02-07 DIAGNOSIS — E66811 Obesity, class 1: Secondary | ICD-10-CM

## 2020-02-07 DIAGNOSIS — E1140 Type 2 diabetes mellitus with diabetic neuropathy, unspecified: Secondary | ICD-10-CM

## 2020-02-07 DIAGNOSIS — Z79899 Other long term (current) drug therapy: Secondary | ICD-10-CM

## 2020-02-07 DIAGNOSIS — I130 Hypertensive heart and chronic kidney disease with heart failure and stage 1 through stage 4 chronic kidney disease, or unspecified chronic kidney disease: Secondary | ICD-10-CM | POA: Diagnosis present

## 2020-02-07 DIAGNOSIS — R531 Weakness: Secondary | ICD-10-CM | POA: Diagnosis not present

## 2020-02-07 DIAGNOSIS — I10 Essential (primary) hypertension: Secondary | ICD-10-CM | POA: Diagnosis not present

## 2020-02-07 NOTE — ED Triage Notes (Addendum)
Pt here due to weakness since yesterday. Pt states that she was hypotensive at home and called cardiologist and was told just to stop taking lisinopril for now. Pt states that hasn't taken lisinopril today but has continued to be weak and dizzy.

## 2020-02-08 ENCOUNTER — Emergency Department (HOSPITAL_COMMUNITY): Payer: 59

## 2020-02-08 DIAGNOSIS — N179 Acute kidney failure, unspecified: Secondary | ICD-10-CM

## 2020-02-08 DIAGNOSIS — E875 Hyperkalemia: Secondary | ICD-10-CM | POA: Diagnosis not present

## 2020-02-08 LAB — CBC WITH DIFFERENTIAL/PLATELET
Abs Immature Granulocytes: 0.02 10*3/uL (ref 0.00–0.07)
Basophils Absolute: 0 10*3/uL (ref 0.0–0.1)
Basophils Relative: 1 %
Eosinophils Absolute: 0.1 10*3/uL (ref 0.0–0.5)
Eosinophils Relative: 1 %
HCT: 37 % (ref 36.0–46.0)
Hemoglobin: 12.2 g/dL (ref 12.0–15.0)
Immature Granulocytes: 0 %
Lymphocytes Relative: 43 %
Lymphs Abs: 2.6 10*3/uL (ref 0.7–4.0)
MCH: 31.9 pg (ref 26.0–34.0)
MCHC: 33 g/dL (ref 30.0–36.0)
MCV: 96.6 fL (ref 80.0–100.0)
Monocytes Absolute: 0.4 10*3/uL (ref 0.1–1.0)
Monocytes Relative: 7 %
Neutro Abs: 2.9 10*3/uL (ref 1.7–7.7)
Neutrophils Relative %: 48 %
Platelets: 175 10*3/uL (ref 150–400)
RBC: 3.83 MIL/uL — ABNORMAL LOW (ref 3.87–5.11)
RDW: 12.8 % (ref 11.5–15.5)
WBC: 6 10*3/uL (ref 4.0–10.5)
nRBC: 0 % (ref 0.0–0.2)

## 2020-02-08 LAB — COMPREHENSIVE METABOLIC PANEL
ALT: 38 U/L (ref 0–44)
AST: 33 U/L (ref 15–41)
Albumin: 4.5 g/dL (ref 3.5–5.0)
Alkaline Phosphatase: 130 U/L — ABNORMAL HIGH (ref 38–126)
Anion gap: 7 (ref 5–15)
BUN: 62 mg/dL — ABNORMAL HIGH (ref 6–20)
CO2: 16 mmol/L — ABNORMAL LOW (ref 22–32)
Calcium: 9.7 mg/dL (ref 8.9–10.3)
Chloride: 110 mmol/L (ref 98–111)
Creatinine, Ser: 1.4 mg/dL — ABNORMAL HIGH (ref 0.44–1.00)
GFR calc Af Amer: 47 mL/min — ABNORMAL LOW (ref >60)
GFR calc non Af Amer: 41 mL/min — ABNORMAL LOW (ref >60)
Glucose, Bld: 163 mg/dL — ABNORMAL HIGH (ref 70–99)
Potassium: 7.2 mmol/L (ref 3.5–5.1)
Sodium: 133 mmol/L — ABNORMAL LOW (ref 135–145)
Total Bilirubin: 0.6 mg/dL (ref 0.3–1.2)
Total Protein: 7.6 g/dL (ref 6.5–8.1)

## 2020-02-08 LAB — CBG MONITORING, ED: Glucose-Capillary: 150 mg/dL — ABNORMAL HIGH (ref 70–99)

## 2020-02-08 LAB — SARS CORONAVIRUS 2 BY RT PCR (HOSPITAL ORDER, PERFORMED IN ~~LOC~~ HOSPITAL LAB): SARS Coronavirus 2: NEGATIVE

## 2020-02-08 LAB — TROPONIN I (HIGH SENSITIVITY)
Troponin I (High Sensitivity): <2 ng/L (ref <18)
Troponin I (High Sensitivity): 2 ng/L (ref <18)

## 2020-02-08 LAB — BASIC METABOLIC PANEL
Anion gap: 6 (ref 5–15)
BUN: 49 mg/dL — ABNORMAL HIGH (ref 6–20)
CO2: 20 mmol/L — ABNORMAL LOW (ref 22–32)
Calcium: 10 mg/dL (ref 8.9–10.3)
Chloride: 110 mmol/L (ref 98–111)
Creatinine, Ser: 1.14 mg/dL — ABNORMAL HIGH (ref 0.44–1.00)
GFR calc Af Amer: >60 mL/min (ref >60)
GFR calc non Af Amer: 52 mL/min — ABNORMAL LOW (ref >60)
Glucose, Bld: 136 mg/dL — ABNORMAL HIGH (ref 70–99)
Potassium: 6.4 mmol/L (ref 3.5–5.1)
Sodium: 136 mmol/L (ref 135–145)

## 2020-02-08 LAB — HEMOGLOBIN A1C
Hgb A1c MFr Bld: 7.9 % — ABNORMAL HIGH (ref 4.8–5.6)
Mean Plasma Glucose: 180.03 mg/dL

## 2020-02-08 LAB — GLUCOSE, CAPILLARY
Glucose-Capillary: 142 mg/dL — ABNORMAL HIGH (ref 70–99)
Glucose-Capillary: 178 mg/dL — ABNORMAL HIGH (ref 70–99)
Glucose-Capillary: 141 mg/dL — ABNORMAL HIGH (ref 70–99)
Glucose-Capillary: 148 mg/dL — ABNORMAL HIGH (ref 70–99)

## 2020-02-08 LAB — BRAIN NATRIURETIC PEPTIDE: B Natriuretic Peptide: 7 pg/mL (ref 0.0–100.0)

## 2020-02-08 LAB — HIV ANTIBODY (ROUTINE TESTING W REFLEX): HIV Screen 4th Generation wRfx: NONREACTIVE

## 2020-02-08 MED ORDER — CALCIUM GLUCONATE-NACL 1-0.675 GM/50ML-% IV SOLN
1.0000 g | Freq: Once | INTRAVENOUS | Status: AC
  Administered 2020-02-08: 1000 mg via INTRAVENOUS
  Filled 2020-02-08: qty 50

## 2020-02-08 MED ORDER — CITALOPRAM HYDROBROMIDE 20 MG PO TABS
40.0000 mg | ORAL_TABLET | Freq: Every day | ORAL | Status: DC
  Administered 2020-02-08 – 2020-02-10 (×3): 40 mg via ORAL
  Filled 2020-02-08 (×3): qty 2

## 2020-02-08 MED ORDER — IOHEXOL 350 MG/ML SOLN: 75.0000 mL | Freq: Once | INTRAVENOUS | Status: DC | PRN

## 2020-02-08 MED ORDER — ALPRAZOLAM 1 MG PO TABS: 1.0000 mg | ORAL_TABLET | Freq: Three times a day (TID) | ORAL | Status: DC | PRN

## 2020-02-08 MED ORDER — ONDANSETRON HCL 4 MG/2ML IJ SOLN: 4.0000 mg | Freq: Four times a day (QID) | INTRAMUSCULAR | Status: DC | PRN

## 2020-02-08 MED ORDER — GABAPENTIN 400 MG PO CAPS
400.0000 mg | ORAL_CAPSULE | Freq: Two times a day (BID) | ORAL | Status: DC
  Administered 2020-02-08 – 2020-02-10 (×5): 400 mg via ORAL
  Filled 2020-02-08 (×5): qty 1

## 2020-02-08 MED ORDER — ALPRAZOLAM 0.5 MG PO TABS
0.5000 mg | ORAL_TABLET | Freq: Every day | ORAL | Status: DC
  Administered 2020-02-08 – 2020-02-09 (×2): 0.5 mg via ORAL
  Filled 2020-02-08 (×2): qty 1

## 2020-02-08 MED ORDER — INSULIN ASPART 100 UNIT/ML IV SOLN
5.0000 [IU] | Freq: Once | INTRAVENOUS | Status: AC
  Administered 2020-02-08: 5 [IU] via INTRAVENOUS

## 2020-02-08 MED ORDER — SODIUM BICARBONATE 8.4 % IV SOLN
50.0000 meq | Freq: Once | INTRAVENOUS | Status: AC
  Administered 2020-02-08: 50 meq via INTRAVENOUS

## 2020-02-08 MED ORDER — LACTATED RINGERS IV BOLUS
1000.0000 mL | Freq: Once | INTRAVENOUS | Status: AC
  Administered 2020-02-08: 1000 mL via INTRAVENOUS

## 2020-02-08 MED ORDER — PRAVASTATIN SODIUM 10 MG PO TABS
20.0000 mg | ORAL_TABLET | Freq: Every day | ORAL | Status: DC
  Administered 2020-02-08 – 2020-02-10 (×3): 20 mg via ORAL
  Filled 2020-02-08 (×3): qty 2

## 2020-02-08 MED ORDER — METOPROLOL SUCCINATE ER 50 MG PO TB24
50.0000 mg | ORAL_TABLET | Freq: Every day | ORAL | Status: DC
  Administered 2020-02-08 – 2020-02-09 (×2): 50 mg via ORAL
  Filled 2020-02-08 (×2): qty 1

## 2020-02-08 MED ORDER — SODIUM CHLORIDE 0.9% FLUSH
3.0000 mL | INTRAVENOUS | Status: DC | PRN
  Administered 2020-02-09: 3 mL via INTRAVENOUS

## 2020-02-08 MED ORDER — SODIUM CHLORIDE 0.9% FLUSH
3.0000 mL | Freq: Two times a day (BID) | INTRAVENOUS | Status: DC
  Administered 2020-02-08 – 2020-02-10 (×4): 3 mL via INTRAVENOUS

## 2020-02-08 MED ORDER — VERAPAMIL HCL ER 240 MG PO TBCR
120.0000 mg | EXTENDED_RELEASE_TABLET | Freq: Every day | ORAL | Status: DC
  Administered 2020-02-08 – 2020-02-09 (×2): 120 mg via ORAL
  Filled 2020-02-08 (×2): qty 1

## 2020-02-08 MED ORDER — SODIUM BICARBONATE 8.4 % IV SOLN
50.0000 meq | Freq: Once | INTRAVENOUS | Status: AC
  Administered 2020-02-08: 50 meq via INTRAVENOUS
  Filled 2020-02-08: qty 50

## 2020-02-08 MED ORDER — SODIUM ZIRCONIUM CYCLOSILICATE 5 G PO PACK
5.0000 g | PACK | Freq: Two times a day (BID) | ORAL | Status: AC
  Administered 2020-02-08 – 2020-02-09 (×3): 5 g via ORAL
  Filled 2020-02-08 (×3): qty 1

## 2020-02-08 MED ORDER — ASPIRIN 81 MG PO CHEW
81.0000 mg | CHEWABLE_TABLET | Freq: Every day | ORAL | Status: DC
  Administered 2020-02-08 – 2020-02-09 (×2): 81 mg via ORAL
  Filled 2020-02-08 (×2): qty 1

## 2020-02-08 MED ORDER — SODIUM ZIRCONIUM CYCLOSILICATE 5 G PO PACK
5.0000 g | PACK | Freq: Once | ORAL | Status: AC
  Administered 2020-02-08: 5 g via ORAL
  Filled 2020-02-08: qty 1

## 2020-02-08 MED ORDER — SODIUM CHLORIDE 0.9 % IV SOLN: 250.0000 mL | INTRAVENOUS | Status: DC | PRN

## 2020-02-08 MED ORDER — BUPROPION HCL ER (SR) 150 MG PO TB12
150.0000 mg | ORAL_TABLET | Freq: Every day | ORAL | Status: DC
  Administered 2020-02-08 – 2020-02-10 (×3): 150 mg via ORAL
  Filled 2020-02-08 (×3): qty 1

## 2020-02-08 MED ORDER — INSULIN ASPART 100 UNIT/ML ~~LOC~~ SOLN
0.0000 [IU] | Freq: Three times a day (TID) | SUBCUTANEOUS | Status: DC
  Administered 2020-02-08: 2 [IU] via SUBCUTANEOUS
  Administered 2020-02-08 (×2): 1 [IU] via SUBCUTANEOUS
  Administered 2020-02-09 (×2): 2 [IU] via SUBCUTANEOUS

## 2020-02-08 MED ORDER — ONDANSETRON HCL 4 MG PO TABS: 4.0000 mg | ORAL_TABLET | Freq: Four times a day (QID) | ORAL | Status: DC | PRN

## 2020-02-08 MED ORDER — PANTOPRAZOLE SODIUM 40 MG PO TBEC
40.0000 mg | DELAYED_RELEASE_TABLET | Freq: Every day | ORAL | Status: DC
  Administered 2020-02-08 – 2020-02-10 (×3): 40 mg via ORAL
  Filled 2020-02-08 (×3): qty 1

## 2020-02-08 NOTE — Progress Notes (Signed)
CRITICAL VALUE ALERT  Critical Value: Potassium 6.4  Date & Time Notied:  02/08/2020  [id number]  Provider Notified: Dr. Dyann Kief  Orders Received/Actions taken: orders given

## 2020-02-08 NOTE — H&P (Signed)
TRH H&P    Patient Demographics:    [name] [name], is a 59 y.o. female  MRN: [id number]  DOB - Oct 19, 1960  Admit Date - 02/07/2020  Referring MD/NP/PA: Merrily Pew  Outpatient Primary MD for the patient is Glenda Chroman, MD  Patient coming from: Home  Chief complaint- Generalized weakness   HPI:    [name] [name]  is a 59 y.o. female, with history of hypertension, chronic diastolic CHF, anxiety, depression, diabetes mellitus type 2, panic disorder, stroke came to hospital with complaints of dizziness and generalized weakness.  Patient said that she developed weakness after she was at her hairdresser yesterday with her neck hyperextended to get her hair washed.  She also felt weak in her arms and legs.  When she got her blood pressure was low in the 90s over 60s.  When she called her cardiologist they told her to stop lisinopril.  This morning blood pressure was in 0000000 systolic.  Patient had not taken Lasix for 2 days but did take her Lasix yesterday to see if it would help.  She also took extra dose of potassium on Friday.  In the ED patient was found to have hyperkalemia with potassium 7.2, acute kidney injury with creatinine 1.40, her baseline creatinine was 0.90 in November 2020. Patient received calcium gluconate, insulin, sodium bicarb and Lokelma in the ED. She had brief episode of chest pain at home. Denies shortness of breath denies chest pain at this time. Denies abdominal pain or dysuria Denies nausea vomiting or diarrhea     Review of systems:    In addition to the HPI above,    All other systems reviewed and are negative.    Past History of the following :    Past Medical History:  Diagnosis Date  . Abnormal EKG   . Allergy   . Anxiety    PTSD  . CHF (congestive heart failure) ([place])   . Depression   . Depression   . Diabetes mellitus without complication ([place])   . Dyspnea   . GERD  (gastroesophageal reflux disease)   . Hyperlipidemia   . Hypertension    Unspecified  . IBS (irritable bowel syndrome)   . Kidney stone   . Panic disorder 03/20/2019  . Seizures ([place])    Reports last seizure was 30 + years ago   . Sleep apnea    no CPAP worn  . Stroke [facility])       Past Surgical History:  Procedure Laterality Date  . HYSTEROSCOPY  2001   submucous myomectomy  . TOTAL ABDOMINAL HYSTERECTOMY  2002   TAH BSO  pathology showed leiomyoma, adenomyosis, simple and complex hyperplasia without atypia  . TUMOR REMOVAL     from left ear      Social History:      Social History   Tobacco Use  . Smoking status: Never Smoker  . Smokeless tobacco: Never Used  Substance Use Topics  . Alcohol use: No    Alcohol/week: 0.0 standard drinks  Family History :     Family History  Problem Relation Age of Onset  . Cancer Father        Prostate  . High blood pressure Father   . Coronary artery disease Paternal Grandfather   . Uterine cancer Mother   . Irritable bowel syndrome Mother   . Stroke Mother   . Osteoporosis Mother   . Heart disease Maternal Grandmother   . Heart attack Brother 72  . Cancer Brother        Prostate  . Colitis Neg Hx   . Esophageal cancer Neg Hx   . Stomach cancer Neg Hx   . Rectal cancer Neg Hx       Home Medications:   Prior to Admission medications   Medication Sig Start Date End Date Taking? Authorizing Provider  ALPRAZolam Duanne Moron) 0.5 MG tablet Take 0.5 mg by mouth at bedtime.     [provider]  ALPRAZolam Duanne Moron) 1 MG tablet Take 1 mg by mouth 3 (three) times daily as needed.     [provider]  aspirin 81 MG tablet Take 81 mg by mouth at bedtime.     [provider]  buPROPion (WELLBUTRIN SR) 150 MG 12 hr tablet Take 150 mg by mouth daily.    [provider]  citalopram (CELEXA) 40 MG tablet Take 40 mg by mouth daily.    [provider]  Fluoxetine HCl, PMDD, 20 MG TABS Take  by mouth.    [provider]  furosemide (LASIX) 20 MG tablet Twice daily as needed if weight >3 Lbs 05/29/19   Adrian Prows, MD  gabapentin (NEURONTIN) 400 MG capsule Take 400 mg by mouth 2 (two) times daily.    [provider]  glimepiride (AMARYL) 2 MG tablet Take 2 mg by mouth daily with breakfast.     [provider]  glimepiride (AMARYL) 4 MG tablet Take 4 mg by mouth daily. 01/14/20   [provider]  lisinopril (ZESTRIL) 5 MG tablet Take 5 mg by mouth daily.    [provider]  metoprolol succinate (TOPROL-XL) 50 MG 24 hr tablet TAKE 1 TABLET BY MOUTH EVERY DAY 02/05/20   Adrian Prows, MD  omeprazole (PRILOSEC) 20 MG capsule Take 20 mg by mouth at bedtime.  07/02/14   [provider]  potassium chloride SA (K-DUR) 20 MEQ tablet Take 2 tablets (40 mEq total) by mouth daily as needed. With Furosemide 05/29/19   Adrian Prows, MD  pravastatin (PRAVACHOL) 20 MG tablet Take 1 tablet (20 mg total) by mouth daily. 12/29/19   Adrian Prows, MD  Semaglutide,0.25 or 0.5MG /DOS, (OZEMPIC, 0.25 OR 0.5 MG/DOSE,) 2 MG/1.5ML SOPN Inject 2.5 mLs into the skin every Wednesday.  08/14/18   [provider]  spironolactone (ALDACTONE) 50 MG tablet TAKE 1 TABLET BY MOUTH EVERY MORNING 01/19/20   Adrian Prows, MD  verapamil (CALAN-SR) 120 MG CR tablet Take 120 mg by mouth at bedtime.  07/02/14   [provider]     Allergies:     Allergies  Allergen Reactions  . Codeine Rash  . Doxycycline Rash  . Erythromycin Rash     Physical Exam:   Vitals  Blood pressure 133/60, pulse 79, temperature 97.8 F (36.6 C), temperature source Oral, resp. rate 17, height 5\' 1"  (1.549 m), weight 74.8 kg, SpO2 97 %.  1.  General: Appears in no acute distress  2. Psychiatric: Not, oriented x3, intact insight and judgment  3. Neurologic: Cranial  nerves II through grossly intact, no focal deficit noted  4. HEENMT:  Atraumatic normocephalic, extraocular muscles  are intact  5. Respiratory : Clear to auscultation bilaterally, no wheezing or crackles auscultated S1 is  6. Cardiovascular : , Regular, no murmur auscultated  7. Gastrointestinal:  Abdomen is soft, nontender, no organomegaly      Data Review:    CBC Recent Labs  Lab 02/08/20 0033  WBC 6.0  HGB 12.2  HCT 37.0  PLT 175  MCV 96.6  MCH 31.9  MCHC 33.0  RDW 12.8  LYMPHSABS 2.6  MONOABS 0.4  EOSABS 0.1  BASOSABS 0.0   ------------------------------------------------------------------------------------------------------------------  Results for orders placed or performed during the hospital encounter of 02/07/20 (from the past 48 hour(s))  CBC with Differential     Status: Abnormal   Collection Time: 02/08/20 12:33 AM  Result Value Ref Range   WBC 6.0 4.0 - 10.5 K/uL   RBC 3.83 (L) 3.87 - 5.11 MIL/uL   Hemoglobin 12.2 12.0 - 15.0 g/dL   HCT 37.0 36.0 - 46.0 %   MCV 96.6 80.0 - 100.0 fL   MCH 31.9 26.0 - 34.0 pg   MCHC 33.0 30.0 - 36.0 g/dL   RDW 12.8 11.5 - 15.5 %   Platelets 175 150 - 400 K/uL   nRBC 0.0 0.0 - 0.2 %   Neutrophils Relative % 48 %   Neutro Abs 2.9 1.7 - 7.7 K/uL   Lymphocytes Relative 43 %   Lymphs Abs 2.6 0.7 - 4.0 K/uL   Monocytes Relative 7 %   Monocytes Absolute 0.4 0.1 - 1.0 K/uL   Eosinophils Relative 1 %   Eosinophils Absolute 0.1 0.0 - 0.5 K/uL   Basophils Relative 1 %   Basophils Absolute 0.0 0.0 - 0.1 K/uL   Immature Granulocytes 0 %   Abs Immature Granulocytes 0.02 0.00 - 0.07 K/uL    Comment: Performed at [facility], [address], [place], [place] [address]  Comprehensive metabolic panel     Status: Abnormal   Collection Time: 02/08/20 12:33 AM  Result Value Ref Range   Sodium 133 (L) 135 - 145 mmol/L   Potassium 7.2 (HH) 3.5 - 5.1 mmol/L    Comment: CRITICAL RESULT CALLED TO, READ BACK BY AND VERIFIED WITH: SAPPLET,J @ 0129 ON 02/08/20 BY JUW    Chloride 110 98 - 111 mmol/L   CO2 16 (L) 22 - 32 mmol/L   Glucose, Bld 163  (H) 70 - 99 mg/dL    Comment: Glucose reference range applies only to samples taken after fasting for at least 8 hours.   BUN 62 (H) 6 - 20 mg/dL   Creatinine, Ser 1.40 (H) 0.44 - 1.00 mg/dL   Calcium 9.7 8.9 - 10.3 mg/dL   Total Protein 7.6 6.5 - 8.1 g/dL   Albumin 4.5 3.5 - 5.0 g/dL   AST 33 15 - 41 U/L   ALT 38 0 - 44 U/L   Alkaline Phosphatase 130 (H) 38 - 126 U/L   Total Bilirubin 0.6 0.3 - 1.2 mg/dL   GFR calc non Af Amer 41 (L) >60 mL/min   GFR calc Af Amer 47 (L) >60 mL/min   Anion gap 7 5 - 15    Comment: Performed at [facility], [address] [place], [place] [address]  Troponin I (High Sensitivity)     Status: None   Collection Time: 02/08/20 12:33 AM  Result Value Ref Range   Troponin I (High Sensitivity) <2 <  18 ng/L    Comment: (NOTE) Elevated high sensitivity troponin I (hsTnI) values and significant  changes across serial measurements may suggest ACS but many other  chronic and acute conditions are known to elevate hsTnI results.  Refer to the Links section for chest pain algorithms and additional  guidance. Performed at [facility], [address] [place], [place] [address]   Brain natriuretic peptide     Status: None   Collection Time: 02/08/20 12:34 AM  Result Value Ref Range   B Natriuretic Peptide 7.0 0.0 - 100.0 pg/mL    Comment: Performed at [facility], [address], [place], [place] [address]  CBG monitoring, ED     Status: Abnormal   Collection Time: 02/08/20  1:37 AM  Result Value Ref Range   Glucose-Capillary 150 (H) 70 - 99 mg/dL    Comment: Glucose reference range applies only to samples taken after fasting for at least 8 hours.  Troponin I (High Sensitivity)     Status: None   Collection Time: 02/08/20  2:00 AM  Result Value Ref Range   Troponin I (High Sensitivity) 2 <18 ng/L    Comment: (NOTE) Elevated high sensitivity troponin I (hsTnI) values and significant  changes across serial measurements may suggest ACS but many other  chronic  and acute conditions are known to elevate hsTnI results.  Refer to the "Links" section for chest pain algorithms and additional  guidance. Performed at [facility], [address] [place], [place] [address]     Chemistries  Recent Labs  Lab 02/08/20 0033  NA 133*  K 7.2*  CL 110  CO2 16*  GLUCOSE 163*  BUN 62*  CREATININE 1.40*  CALCIUM 9.7  AST 33  ALT 38  ALKPHOS 130*  BILITOT 0.6   ------------------------------------------------------------------------------------------------------------------  ------------------------------------------------------------------------------------------------------------------ GFR: Estimated Creatinine Clearance: 39.5 mL/min (A) (by C-G formula based on SCr of 1.4 mg/dL (H)). Liver Function Tests: Recent Labs  Lab 02/08/20 0033  AST 33  ALT 38  ALKPHOS 130*  BILITOT 0.6  PROT 7.6  ALBUMIN 4.5   No results for input(s): LIPASE, AMYLASE in the last 168 hours. No results for input(s): AMMONIA in the last 168 hours. Coagulation Profile: No results for input(s): INR, PROTIME in the last 168 hours. Cardiac Enzymes: No results for input(s): CKTOTAL, CKMB, CKMBINDEX, TROPONINI in the last 168 hours. BNP (last 3 results) No results for input(s): PROBNP in the last 8760 hours. HbA1C: No results for input(s): HGBA1C in the last 72 hours. CBG: Recent Labs  Lab 02/08/20 0137  GLUCAP 150*   Lipid Profile: No results for input(s): CHOL, HDL, LDLCALC, TRIG, CHOLHDL, LDLDIRECT in the last 72 hours. Thyroid Function Tests: No results for input(s): TSH, T4TOTAL, FREET4, T3FREE, THYROIDAB in the last 72 hours. Anemia Panel: No results for input(s): VITAMINB12, FOLATE, FERRITIN, TIBC, IRON, RETICCTPCT in the last 72 hours.  --------------------------------------------------------------------------------------------------------------- Urine analysis:    Component Value Date/Time   COLORURINE AMBER (A) 12/28/2017 1705   APPEARANCEUR  CLOUDY (A) 12/28/2017 1705   LABSPEC 1.009 12/28/2017 1705   PHURINE 6.0 12/28/2017 1705   GLUCOSEU NEGATIVE 12/28/2017 1705   HGBUR LARGE (A) 12/28/2017 1705   BILIRUBINUR NEGATIVE 12/28/2017 1705   KETONESUR NEGATIVE 12/28/2017 1705   PROTEINUR 100 (A) 12/28/2017 1705   UROBILINOGEN 0.2 12/04/2014 1454   NITRITE NEGATIVE 12/28/2017 1705   LEUKOCYTESUR NEGATIVE 12/28/2017 1705      Imaging Results:    No results found.  My personal review of EKG: Rhythm  NSR, tall T waves noted in V4 V5 V6, lead II   Assessment & Plan:    Active Problems:   Hyperkalemia   1. Hyperkalemia-secondary to increased p.o. intake of potassium supplementation, along with lisinopril, Aldactone.  Patient has stopped taking Lasix for past 3 days.  At this time potassium supplements will be held, she has received calcium gluconate in the ED along with 1 ampoule of sodium bicarb, Lokelma 5 g x 1.  Patient also received D50 and IV insulin in the ED.  Will repeat potassium at 5 AM. 2. Chronic diastolic CHF-currently euvolemic.  Not in exacerbation.  Lasix is on hold due to worsening renal function. 3. Acute kidney injury-patient baseline creatinine was 0.90 in November 2020, today presented with creatinine 1.40, likely from diuretics.  Diuretics currently on hold.  Will avoid giving IV fluids due to underlying history of diastolic CHF.  Follow BMP in a.m. 4. Diabetes mellitus type 2-start sliding scale insulin with NovoLog, check CBG before every meal and at bedtime. 5. Hypertension-continue verapamil, metoprolol.  Will hold lisinopril, Aldactone at this time. 6. Depression-continue Wellbutrin, Celexa.    DVT Prophylaxis-   SCDs   AM Labs Ordered, also please review Full Orders  Family Communication: Admission, patients condition and plan of care including tests being ordered have been discussed with the patient and her husband at bedisde who indicate understanding and agree with the plan and Code  Status.  Code Status:  Full code  Admission status: Observation  Time spent in minutes : 60 min   Jasmynn Pfalzgraf S Izabel Chim M.D

## 2020-02-08 NOTE — Care Management Obs Status (Signed)
[place] NOTIFICATION   Patient Details  Name: BEA SEEWALD MRN: [id number] Date of Birth: Jun 02, 1961   Medicare Observation Status Notification Given:  Yes    Natasha Bence, LCSW 02/08/2020, 5:53 PM

## 2020-02-08 NOTE — ED Notes (Signed)
Date and time results received: 02/08/20 0129   Test: K+ Critical Value: 7.2  Name of Provider Notified: [place], MD

## 2020-02-08 NOTE — Progress Notes (Signed)
Patient seen and examined. Admitted after midnight secondary to generalized weakness and found to be with hyperkalemia and and AKI. Hemodynamically stable currently, denying CP and SOB. Patient is afebrile and in no distress. Please refer to H&P for further info/details on admission.  Plan: -follow potassium and continue stabilizing electrolytes -avoid nephrotoxic agents and hypotension -continue to follow daily weights, strict I's and O's and telemetry monitoring. -follow clinical response. -most likely home in am (02/09/20)   Barton Dubois MD [phone]

## 2020-02-08 NOTE — ED Notes (Signed)
Pt. Felt dizzy and unsteady after standing for a minute.

## 2020-02-08 NOTE — ED Provider Notes (Signed)
Emergency Department Provider Note  I have reviewed the triage vital signs and the nursing notes.  HISTORY  Chief Complaint Weakness   HPI [name] [name] is a 59 y.o. female with multiple medical problems as documented below who presents the emergency department today with dizziness and weakness.  Patient states that she was at the hairdresser yesterday with her neck hyperextended getting her hair washed.  When she sat up from that encounter she had a sensation of the room spinning.  She also felt weak in her arms and legs.  This improved a little bit and patient was able to drive to her house.  When she got there her blood pressures were slightly low in the 90s over 60s.  She is normally [id number] systolic.  She did talk to her cardiologist who told her to stop taking the lisinopril.  Patient states she continued with her symptoms this morning she took her blood pressure again it was [id number] systolic which is still low for her.  She was still symptomatic.  She did not take her Lasix today to see if it would help.  I did not and she presents here for further evaluation.  She denies headache, neck pain, chest pain, back pain, shortness of breath or other associated symptoms.  She has no worsening leg swelling.  She has no other neurologic or focal neurologic changes.  No recent illnesses.   No other associated or modifying symptoms.    Past Medical History:  Diagnosis Date  . Abnormal EKG   . Allergy   . Anxiety    PTSD  . CHF (congestive heart failure) ([place])   . Depression   . Depression   . Diabetes mellitus without complication ([place])   . Dyspnea   . GERD (gastroesophageal reflux disease)   . Hyperlipidemia   . Hypertension    Unspecified  . IBS (irritable bowel syndrome)   . Kidney stone   . Panic disorder 03/20/2019  . Seizures ([place])    Reports last seizure was 30 + years ago   . Sleep apnea    no CPAP worn  . Stroke [facility])     Patient Active Problem List   Diagnosis Date Noted    . Panic disorder 03/20/2019  . External hemorrhoids 12/08/2014  . Rectal bleeding 11/22/2014  . Hemorrhoids, internal 11/22/2014  . Essential hypertension 06/08/2009  . DYSPNEA 06/08/2009  . ABNORMAL EKG 06/08/2009    Past Surgical History:  Procedure Laterality Date  . HYSTEROSCOPY  2001   submucous myomectomy  . TOTAL ABDOMINAL HYSTERECTOMY  2002   TAH BSO  pathology showed leiomyoma, adenomyosis, simple and complex hyperplasia without atypia  . TUMOR REMOVAL     from left ear    Current Outpatient Rx  . Order #: [id number] Class: Historical Med  . Order #: [id number] Class: Historical Med  . Order #: [id number] Class: Historical Med  . Order #: [id number] Class: Historical Med  . Order #: [id number] Class: Historical Med  . Order #: [id number] Class: Historical Med  . Order #: [id number] Class: No Print  . Order #: [id number] Class: Historical Med  . Order #: [id number] Class: Historical Med  . Order #: [id number] Class: Historical Med  . Order #: [id number] Class: Historical Med  . Order #: [id number] Class: Normal  . Order #: [id number] Class: Historical Med  . Order #: [id number] Class: No Print  . Order #: [id number] Class: Normal  . Order #: [id number] Class: Historical Med  . Order #: [id number] Class: Normal  . Order #: [id number] Class: Historical Med  Allergies Codeine, Doxycycline, and Erythromycin  Family History  Problem Relation Age of Onset  . Cancer Father        Prostate  . High blood pressure Father   . Coronary artery disease Paternal Grandfather   . Uterine cancer Mother   . Irritable bowel syndrome Mother   . Stroke Mother   . Osteoporosis Mother   . Heart disease Maternal Grandmother   . Heart attack Brother 66  . Cancer Brother        Prostate  . Colitis Neg Hx   . Esophageal cancer Neg Hx   . Stomach cancer Neg Hx   . Rectal cancer Neg Hx     Social History Social History   Tobacco Use  . Smoking status: Never Smoker  . Smokeless tobacco: Never Used   Substance Use Topics  . Alcohol use: No    Alcohol/week: 0.0 standard drinks  . Drug use: No    Review of Systems  All other systems negative except as documented in the HPI. All pertinent positives and negatives as reviewed in the HPI. ____________________________________________  PHYSICAL EXAM:  VITAL SIGNS: ED Triage Vitals  Enc Vitals Group     BP 02/07/20 2340 129/72     Pulse Rate 02/07/20 2340 74     Resp 02/07/20 2340 17     Temp 02/07/20 2340 97.8 F (36.6 C)     Temp Source 02/07/20 2340 Oral     SpO2 02/07/20 2340 97 %     Weight 02/07/20 2341 165 lb (74.8 kg)     Height 02/07/20 2341 5\' 1"  (1.549 m)    Constitutional: Alert and oriented. Well appearing and in no acute distress. Eyes: Conjunctivae are normal. PERRL. EOMI. Head: Atraumatic. Nose: No congestion/rhinnorhea. Mouth/Throat: Mucous membranes are moist.  Oropharynx non-erythematous. Neck: No stridor.  No meningeal signs.   Cardiovascular: Normal rate, regular rhythm. Good peripheral circulation.  2/6 systolic heart murmur Respiratory: Normal respiratory effort.  No retractions. Lungs CTAB. Gastrointestinal: Soft and nontender. No distention.  Musculoskeletal: No lower extremity tenderness nor edema. No gross deformities of extremities. Neurologic: Normal all extraocular movements, no nystagmus.  He was equal round reactive to light.  Symmetric facial movements.  Equal grip strength bilaterally and equal sensation to light touch bilaterally in upper extremities.  Equal dorsiflexion plantarflexion of both feet with equal sensation to light touch bilaterally in both legs.  She has equal patellar and Achilles reflexes. Skin:  Skin is warm, dry and intact. No rash noted.  ____________________________________________   LABS (all labs ordered are listed, but only abnormal results are displayed)  Labs Reviewed  CBC WITH DIFFERENTIAL/PLATELET - Abnormal; Notable for the following components:      Result  Value   RBC 3.83 (*)    All other components within normal limits  COMPREHENSIVE METABOLIC PANEL - Abnormal; Notable for the following components:   Sodium 133 (*)    Potassium 7.2 (*)    CO2 16 (*)    Glucose, Bld 163 (*)    BUN 62 (*)    Creatinine, Ser 1.40 (*)    Alkaline Phosphatase 130 (*)    GFR calc non Af Amer 41 (*)    GFR calc Af Amer 47 (*)    All other components within normal limits  CBG MONITORING, ED - Abnormal; Notable for the following components:   Glucose-Capillary 150 (*)    All other components within normal limits  BRAIN NATRIURETIC PEPTIDE  TROPONIN I (HIGH SENSITIVITY)  TROPONIN I (HIGH SENSITIVITY)   ____________________________________________  EKG   EKG Interpretation  Date/Time:  Saturday Feb 07 2020 23:48:09 EDT Ventricular Rate:  73 PR Interval:    QRS Duration: 100 QT Interval:  376 QTC Calculation: 415 R Axis:   89 Text Interpretation: Sinus rhythm Borderline right axis deviation Consider anterior infarct Minimal ST elevation, inferior leads No significant change since last tracing in Nov 2020 Confirmed by Merrily Pew [phone]) on 02/08/2020 12:45:05 AM     ' ____________________________________________  RADIOLOGY  No results found. ____________________________________________  PROCEDURES  Procedure(s) performed:   .Critical Care Performed by: Merrily Pew, MD Authorized by: Merrily Pew, MD   Critical care provider statement:    Critical care time (minutes):  45   Critical care was necessary to treat or prevent imminent or life-threatening deterioration of the following conditions:  Dehydration and metabolic crisis   Critical care was time spent personally by me on the following activities:  Discussions with consultants, evaluation of patient's response to treatment, examination of patient, ordering and performing treatments and interventions, ordering and review of laboratory studies, ordering and review of radiographic  studies, pulse oximetry, re-evaluation of patient's condition, obtaining history from patient or surrogate and review of old charts   I assumed direction of critical care for this patient from another provider in my specialty: no     ____________________________________________  INITIAL IMPRESSION / [place] / ED COURSE   This patient presents to the ED for concern of vertigo, this involves an extensive number of treatment options, and is a complaint that carries with it a high risk of complications and morbidity.  The differential diagnosis includes vertebral artery injury, less likely CVA, cardiac causes or peripheral in nature.  Could also be related to relative hypotension, hypokalemia.  Lab Tests:   I Ordered, reviewed, and interpreted labs, which included CBC, CMP, BNP, troponin and   Medicines ordered:   I ordered medication insulin/bicarb, lokelma, fluids  For AKI and hyperkalemia   Imaging Studies ordered:   None indicated after finding hyper-K and AKI as likely cause  Additional history obtained:   Additional history obtained from husband  Previous records obtained and reviewed epic  Consultations Obtained:   I consulted hospitalist  and discussed lab and imaging findings  Reevaluation:  After the interventions stated above, I reevaluated the patient and found persistent symptoms.   Critical Interventions: No actual indication for dialysis at this time.  Will give meds for temprorary relocation of K pending meds to definitely remove.  ____________________________________________  FINAL CLINICAL IMPRESSION(S) / ED DIAGNOSES  Final diagnoses:  Hyperkalemia  AKI (acute kidney injury) ([place])    MEDICATIONS GIVEN DURING THIS VISIT:  Medications  iohexol (OMNIPAQUE) 350 MG/ML injection 75 mL (has no administration in time range)  lactated ringers bolus 1,000 mL (0 mLs Intravenous Stopped 02/08/20 0245)  calcium gluconate 1 g/ 50 mL sodium  chloride IVPB (0 mg Intravenous Stopped 02/08/20 0246)  sodium bicarbonate injection 50 mEq (50 mEq Intravenous Given 02/08/20 0146)  insulin aspart (novoLOG) injection 5 Units (5 Units Intravenous Given 02/08/20 0147)  sodium zirconium cyclosilicate (LOKELMA) packet 5 g (5 g Oral Given 02/08/20 0146)    NEW OUTPATIENT MEDICATIONS STARTED DURING THIS VISIT:  New Prescriptions   No medications on file    Note:  This note was prepared with assistance of Dragon voice recognition software. Occasional wrong-word or sound-a-like substitutions may have occurred due to the inherent limitations of voice recognition software.  Lillias Difrancesco, Corene Cornea, MD 02/08/20 [phone]

## 2020-02-09 DIAGNOSIS — F329 Major depressive disorder, single episode, unspecified: Secondary | ICD-10-CM | POA: Diagnosis present

## 2020-02-09 DIAGNOSIS — Z8673 Personal history of transient ischemic attack (TIA), and cerebral infarction without residual deficits: Secondary | ICD-10-CM | POA: Diagnosis not present

## 2020-02-09 DIAGNOSIS — Z881 Allergy status to other antibiotic agents status: Secondary | ICD-10-CM | POA: Diagnosis not present

## 2020-02-09 DIAGNOSIS — Z79899 Other long term (current) drug therapy: Secondary | ICD-10-CM | POA: Diagnosis not present

## 2020-02-09 DIAGNOSIS — Z7984 Long term (current) use of oral hypoglycemic drugs: Secondary | ICD-10-CM | POA: Diagnosis not present

## 2020-02-09 DIAGNOSIS — I130 Hypertensive heart and chronic kidney disease with heart failure and stage 1 through stage 4 chronic kidney disease, or unspecified chronic kidney disease: Secondary | ICD-10-CM | POA: Diagnosis not present

## 2020-02-09 DIAGNOSIS — Z823 Family history of stroke: Secondary | ICD-10-CM | POA: Diagnosis not present

## 2020-02-09 DIAGNOSIS — F410 Panic disorder [episodic paroxysmal anxiety] without agoraphobia: Secondary | ICD-10-CM | POA: Diagnosis present

## 2020-02-09 DIAGNOSIS — I5032 Chronic diastolic (congestive) heart failure: Secondary | ICD-10-CM | POA: Diagnosis not present

## 2020-02-09 DIAGNOSIS — N182 Chronic kidney disease, stage 2 (mild): Secondary | ICD-10-CM | POA: Diagnosis not present

## 2020-02-09 DIAGNOSIS — E1122 Type 2 diabetes mellitus with diabetic chronic kidney disease: Secondary | ICD-10-CM | POA: Diagnosis not present

## 2020-02-09 DIAGNOSIS — Z885 Allergy status to narcotic agent status: Secondary | ICD-10-CM | POA: Diagnosis not present

## 2020-02-09 DIAGNOSIS — E669 Obesity, unspecified: Secondary | ICD-10-CM | POA: Diagnosis not present

## 2020-02-09 DIAGNOSIS — E1140 Type 2 diabetes mellitus with diabetic neuropathy, unspecified: Secondary | ICD-10-CM | POA: Diagnosis not present

## 2020-02-09 DIAGNOSIS — E6609 Other obesity due to excess calories: Secondary | ICD-10-CM

## 2020-02-09 DIAGNOSIS — K219 Gastro-esophageal reflux disease without esophagitis: Secondary | ICD-10-CM | POA: Diagnosis not present

## 2020-02-09 DIAGNOSIS — Z7982 Long term (current) use of aspirin: Secondary | ICD-10-CM | POA: Diagnosis not present

## 2020-02-09 DIAGNOSIS — Z8249 Family history of ischemic heart disease and other diseases of the circulatory system: Secondary | ICD-10-CM | POA: Diagnosis not present

## 2020-02-09 DIAGNOSIS — Z888 Allergy status to other drugs, medicaments and biological substances status: Secondary | ICD-10-CM | POA: Diagnosis not present

## 2020-02-09 DIAGNOSIS — R69 Illness, unspecified: Secondary | ICD-10-CM | POA: Diagnosis not present

## 2020-02-09 DIAGNOSIS — Z6831 Body mass index (BMI) 31.0-31.9, adult: Secondary | ICD-10-CM

## 2020-02-09 DIAGNOSIS — N179 Acute kidney failure, unspecified: Secondary | ICD-10-CM | POA: Diagnosis not present

## 2020-02-09 DIAGNOSIS — Z20822 Contact with and (suspected) exposure to covid-19: Secondary | ICD-10-CM | POA: Diagnosis not present

## 2020-02-09 DIAGNOSIS — E875 Hyperkalemia: Secondary | ICD-10-CM | POA: Diagnosis not present

## 2020-02-09 LAB — GLUCOSE, CAPILLARY
Glucose-Capillary: 93 mg/dL (ref 70–99)
Glucose-Capillary: 173 mg/dL — ABNORMAL HIGH (ref 70–99)
Glucose-Capillary: 168 mg/dL — ABNORMAL HIGH (ref 70–99)
Glucose-Capillary: 191 mg/dL — ABNORMAL HIGH (ref 70–99)

## 2020-02-09 LAB — BASIC METABOLIC PANEL
Anion gap: 6 (ref 5–15)
Anion gap: 8 (ref 5–15)
BUN: 36 mg/dL — ABNORMAL HIGH (ref 6–20)
BUN: 30 mg/dL — ABNORMAL HIGH (ref 6–20)
CO2: 21 mmol/L — ABNORMAL LOW (ref 22–32)
CO2: 21 mmol/L — ABNORMAL LOW (ref 22–32)
Calcium: 9 mg/dL (ref 8.9–10.3)
Calcium: 9.1 mg/dL (ref 8.9–10.3)
Chloride: 106 mmol/L (ref 98–111)
Chloride: 104 mmol/L (ref 98–111)
Creatinine, Ser: 1.05 mg/dL — ABNORMAL HIGH (ref 0.44–1.00)
Creatinine, Ser: 1.05 mg/dL — ABNORMAL HIGH (ref 0.44–1.00)
GFR calc Af Amer: >60 mL/min (ref >60)
GFR calc Af Amer: >60 mL/min (ref >60)
GFR calc non Af Amer: 58 mL/min — ABNORMAL LOW (ref >60)
GFR calc non Af Amer: 58 mL/min — ABNORMAL LOW (ref >60)
Glucose, Bld: 116 mg/dL — ABNORMAL HIGH (ref 70–99)
Glucose, Bld: 232 mg/dL — ABNORMAL HIGH (ref 70–99)
Potassium: 6.2 mmol/L — ABNORMAL HIGH (ref 3.5–5.1)
Potassium: 4.8 mmol/L (ref 3.5–5.1)
Sodium: 133 mmol/L — ABNORMAL LOW (ref 135–145)
Sodium: 133 mmol/L — ABNORMAL LOW (ref 135–145)

## 2020-02-09 MED ORDER — SODIUM POLYSTYRENE SULFONATE 15 GM/60ML PO SUSP
20.0000 g | Freq: Once | ORAL | Status: AC
  Administered 2020-02-09: 20 g via ORAL
  Filled 2020-02-09: qty 120

## 2020-02-09 MED ORDER — SODIUM CHLORIDE 0.9 % IV SOLN: INTRAVENOUS | Status: AC

## 2020-02-09 NOTE — Progress Notes (Signed)
PROGRESS NOTE    [name] [name]  [id number] DOB: 26-Sep-1960 DOA: 02/07/2020  PCP: Glenda Chroman, MD   Chief Complaint  Patient presents with  . Weakness    Brief Narrative:  As per H&P written by Dr. Darrick Meigs on 02/08/2020 59 y.o. female, with history of hypertension, chronic diastolic CHF, anxiety, depression, diabetes mellitus type 2, panic disorder, stroke came to hospital with complaints of dizziness and generalized weakness.  Patient said that she developed weakness after she was at her hairdresser yesterday with her neck hyperextended to get her hair washed.  She also felt weak in her arms and legs.  When she got her blood pressure was low in the 90s over 60s.  When she called her cardiologist they told her to stop lisinopril.  This morning blood pressure was in 0000000 systolic.  Patient had not taken Lasix for 2 days but did take her Lasix yesterday to see if it would help.  She also took extra dose of potassium on Friday.  In the ED patient was found to have hyperkalemia with potassium 7.2, acute kidney injury with creatinine 1.40, her baseline creatinine was 0.90 in November 2020. Patient received calcium gluconate, insulin, sodium bicarb and Lokelma in the ED. She had brief episode of chest pain at home. Denies shortness of breath denies chest pain at this time. Denies abdominal pain or dysuria Denies nausea vomiting or diarrhea  Assessment & Plan: 1-hyperkalemia -In the setting of oral supplementation, increase intake of products rich in potassium (strawberries and other foods) and also acute kidney injury. -Continue IV fluids regular-continue Lokelma and Kayexalate -Follow electrolytes trend -Continue telemetry monitoring -Patient received insulin, sodium bicarbonate and calcium gluconate. -K 6.2 currently  2-acute kidney injury on chronic kidney disease a stage II -Baseline creatinine around 0.9 -Creatinine has peaked at 1.4 at time of admission -Continue holding  nephrotoxic agents -Avoid hypotension -Continue IV fluids -Follow renal function trend.  3-essential hypertension -Currently with soft blood pressure and experiencing orthostatic vital signs -Continue verapamil and metoprolol -Continue IV fluids -Holding lisinopril and spironolactone.  4-depression/anxiety -Continue Wellbutrin and Celexa -No suicidal ideation or hallucinations currently.  5-type 2 diabetes with nephropathy -Continue sliding scale insulin -Follow CBGs -Patient advised to follow modified carbohydrate diet.  6-class I obesity -Body mass index is 31.03 kg/m. -Low calorie diet, portion control and increase physical activity discussed with patient.  7-chronic diastolic heart failure -appears to be stable and compensated -Follow daily weights and strict I's and O's -advise to follow low sodium diet -continue b-blockers.   DVT prophylaxis: SCD's Code Status: Full code Family Communication: Husband at bedside Disposition:   Status is: Inpatient  Dispo: The patient is from: Home              Anticipated d/c is to: Home              Anticipated d/c date is: 02/10/20, if electrolytes are stabilized and patient no longer orthostatic.              Patient currently no medically stable for discharge in the setting of positive orthostatic changes; still elevated potassium and with acute kidney injury.  Will continue IV fluids, repeat orthostatic vital signs in a.m., ask for physical therapy evaluation.  Follow electrolytes trend.  Continue Lokelma and Kayexalate.  Continue telemetry monitoring.        Consultants:   None   Procedures:  See below for x-ray reports.   Antimicrobials:  None   Subjective: No  fever, no chest pain, no nausea, no vomiting.  Overnight found to be orthostatic and complaining of lightheadedness/dizziness when changing positions.  No focal weakness.  Objective: Vitals:   02/09/20 0121 02/09/20 0129 02/09/20 0522 02/09/20 1336    BP: (!) 104/47  (!) 108/45 (!) 131/55  Pulse: (!) 59  (!) 56 76  Resp: 18  18 18   Temp: 98.1 F (36.7 C)  98 F (36.7 C) 98 F (36.7 C)  TempSrc: Oral  Oral Oral  SpO2: 97% 98% 100% 97%  Weight:      Height:        Intake/Output Summary (Last 24 hours) at 02/09/2020 1418 Last data filed at 02/09/2020 1300 Gross per 24 hour  Intake 720 ml  Output --  Net 720 ml   Filed Weights   02/07/20 2341 02/08/20 0537  Weight: 74.8 kg 74.5 kg    Examination: General exam: Appears calm and comfortable .  Reports episode of lightheadedness and dizziness when changing position overnight.  Found to be orthostatic.  No nausea or vomiting.  No abdominal pain.  Patient is afebrile. Respiratory system: Clear to auscultation. Respiratory effort normal. Cardiovascular system: S1 & S2 heard, RRR. No JVD, murmurs, rubs, gallops or clicks. No pedal edema. Gastrointestinal system: Abdomen is nondistended, soft and nontender. No organomegaly or masses felt. Normal bowel sounds heard. Central nervous system: Alert and oriented. No focal neurological deficits. Extremities: Symmetric 5 x 5 power. Skin: No rashes, no petechiae Psychiatry: Judgement and insight appear normal. Mood & affect appropriate.     Data Reviewed: I have personally reviewed following labs and imaging studies  CBC: Recent Labs  Lab 02/08/20 0033  WBC 6.0  NEUTROABS 2.9  HGB 12.2  HCT 37.0  MCV 96.6  PLT 0000000    Basic Metabolic Panel: Recent Labs  Lab 02/08/20 0033 02/08/20 0726 02/09/20 0247  NA 133* 136 133*  K 7.2* 6.4* 6.2*  CL 110 110 106  CO2 16* 20* 21*  GLUCOSE 163* 136* 116*  BUN 62* 49* 36*  CREATININE 1.40* 1.14* 1.05*  CALCIUM 9.7 10.0 9.0    GFR: Estimated Creatinine Clearance: 52.6 mL/min (A) (by C-G formula based on SCr of 1.05 mg/dL (H)).  Liver Function Tests: Recent Labs  Lab 02/08/20 0033  AST 33  ALT 38  ALKPHOS 130*  BILITOT 0.6  PROT 7.6  ALBUMIN 4.5    CBG: Recent Labs   Lab 02/08/20 1130 02/08/20 1606 02/08/20 2131 02/09/20 0737 02/09/20 1103  GLUCAP 178* 141* 148* 93 173*     Recent Results (from the past 240 hour(s))  SARS Coronavirus 2 by RT PCR (hospital order, performed in [facility] hospital lab) Nasopharyngeal Nasopharyngeal Swab     Status: None   Collection Time: 02/08/20  4:46 AM   Specimen: Nasopharyngeal Swab  Result Value Ref Range Status   SARS Coronavirus 2 NEGATIVE NEGATIVE Final    Comment: (NOTE) SARS-CoV-2 target nucleic acids are NOT DETECTED. The SARS-CoV-2 RNA is generally detectable in upper and lower respiratory specimens during the acute phase of infection. The lowest concentration of SARS-CoV-2 viral copies this assay can detect is 250 copies / mL. A negative result does not preclude SARS-CoV-2 infection and should not be used as the sole basis for treatment or other patient management decisions.  A negative result may occur with improper specimen collection / handling, submission of specimen other than nasopharyngeal swab, presence of viral mutation(s) within the areas targeted by this assay, and inadequate number of  viral copies (<250 copies / mL). A negative result must be combined with clinical observations, patient history, and epidemiological information. Fact Sheet for Patients:   StrictlyIdeas.no Fact Sheet for Healthcare Providers: BankingDealers.co.za This test is not yet approved or cleared  by the [place] FDA and has been authorized for detection and/or diagnosis of SARS-CoV-2 by FDA under an Emergency Use Authorization (EUA).  This EUA will remain in effect (meaning this test can be used) for the duration of the COVID-19 declaration under Section 564(b)(1) of the Act, 21 U.S.C. section 360bbb-3(b)(1), unless the authorization is terminated or revoked sooner. Performed at [facility], [address] [place], [place] [address]      Radiology  Studies: No results found.   Scheduled Meds: . ALPRAZolam  0.5 mg Oral QHS  . aspirin  81 mg Oral QHS  . buPROPion  150 mg Oral Daily  . citalopram  40 mg Oral Daily  . gabapentin  400 mg Oral BID  . insulin aspart  0-9 Units Subcutaneous TID WC  . metoprolol succinate  50 mg Oral Daily  . pantoprazole  40 mg Oral Daily  . pravastatin  20 mg Oral Daily  . sodium chloride flush  3 mL Intravenous Q12H  . verapamil  120 mg Oral QHS   Continuous Infusions: . sodium chloride    . sodium chloride 75 mL/hr at 02/09/20 0148     LOS: 0 days    Time spent: 30 minutes.    Barton Dubois, MD [facility]   To contact the attending provider between 7A-7P or the covering provider during after hours 7P-7A, please log into the web site [email or web address] and access using universal [place] password for that web site. If you do not have the password, please call the hospital operator.  02/09/2020, 2:18 PM

## 2020-02-09 NOTE — Progress Notes (Signed)
MD notified pt stated she went to the bathroom and felt really dizzy. No reports of pain or nausea only really dizzy.

## 2020-02-09 NOTE — Progress Notes (Signed)
MD requested orthostatic vitals due to dizziness (see chart) and also request NS at 81ml/hr

## 2020-02-10 DIAGNOSIS — N179 Acute kidney failure, unspecified: Secondary | ICD-10-CM

## 2020-02-10 DIAGNOSIS — E1140 Type 2 diabetes mellitus with diabetic neuropathy, unspecified: Secondary | ICD-10-CM

## 2020-02-10 DIAGNOSIS — K219 Gastro-esophageal reflux disease without esophagitis: Secondary | ICD-10-CM

## 2020-02-10 DIAGNOSIS — I5032 Chronic diastolic (congestive) heart failure: Secondary | ICD-10-CM

## 2020-02-10 DIAGNOSIS — E6609 Other obesity due to excess calories: Secondary | ICD-10-CM

## 2020-02-10 LAB — BASIC METABOLIC PANEL
Anion gap: 9 (ref 5–15)
BUN: 25 mg/dL — ABNORMAL HIGH (ref 6–20)
CO2: 22 mmol/L (ref 22–32)
Calcium: 9.1 mg/dL (ref 8.9–10.3)
Chloride: 107 mmol/L (ref 98–111)
Creatinine, Ser: 0.88 mg/dL (ref 0.44–1.00)
GFR calc Af Amer: >60 mL/min (ref >60)
GFR calc non Af Amer: >60 mL/min (ref >60)
Glucose, Bld: 119 mg/dL — ABNORMAL HIGH (ref 70–99)
Potassium: 4.7 mmol/L (ref 3.5–5.1)
Sodium: 138 mmol/L (ref 135–145)

## 2020-02-10 LAB — GLUCOSE, CAPILLARY: Glucose-Capillary: 113 mg/dL — ABNORMAL HIGH (ref 70–99)

## 2020-02-10 MED ORDER — METOPROLOL SUCCINATE ER 25 MG PO TB24: 25.0000 mg | ORAL_TABLET | Freq: Every day | ORAL | 1 refills | Status: DC

## 2020-02-10 MED ORDER — POTASSIUM CHLORIDE CRYS ER 20 MEQ PO TBCR: 40.0000 meq | EXTENDED_RELEASE_TABLET | Freq: Every day | ORAL | 1 refills | Status: DC | PRN

## 2020-02-10 MED ORDER — ALPRAZOLAM 0.5 MG PO TABS: 0.5000 mg | ORAL_TABLET | Freq: Every day | ORAL | 0 refills | Status: DC

## 2020-02-10 MED ORDER — METOPROLOL SUCCINATE ER 25 MG PO TB24
25.0000 mg | ORAL_TABLET | Freq: Every day | ORAL | Status: DC
  Administered 2020-02-10: 25 mg via ORAL
  Filled 2020-02-10: qty 1

## 2020-02-10 MED ORDER — BUPROPION HCL ER (SR) 150 MG PO TB12: 150.0000 mg | ORAL_TABLET | Freq: Every day | ORAL | Status: DC

## 2020-02-10 NOTE — Evaluation (Signed)
Physical Therapy Evaluation Patient Details Name: [name] [name] MRN: [id number] DOB: December 20, 1960 Today's Date: 02/10/2020   History of Present Illness  [name] [name]  is a 59 y.o. female, with history of hypertension, chronic diastolic CHF, anxiety, depression, diabetes mellitus type 2, panic disorder, stroke came to hospital with complaints of dizziness and generalized weakness.  Patient said that she developed weakness after she was at her hairdresser yesterday with her neck hyperextended to get her hair washed.  She also felt weak in her arms and legs.  When she got her blood pressure was low in the 90s over 60s.  When she called her cardiologist they told her to stop lisinopril.  This morning blood pressure was in 0000000 systolic.  Patient had not taken Lasix for 2 days but did take her Lasix yesterday to see if it would help.  She also took extra dose of potassium on Friday.    Clinical Impression  Patient functioning at baseline for functional mobility and gait.  Plan:  Patient discharged from physical therapy to care of nursing for ambulation daily as tolerated for length of stay.     Follow Up Recommendations No PT follow up    Equipment Recommendations  None recommended by PT    Recommendations for Other Services       Precautions / Restrictions Precautions Precautions: None Restrictions Weight Bearing Restrictions: No      Mobility  Bed Mobility Overal bed mobility: Independent                Transfers Overall transfer level: Independent                  Ambulation/Gait Ambulation/Gait assistance: Modified independent (Device/Increase time) Gait Distance (Feet): 200 Feet Assistive device: None Gait Pattern/deviations: WFL(Within Functional Limits) Gait velocity: slightly decreased   General Gait Details: demonstrates good return ambulating on level, inclined and declined surfaces without loss of balance  Stairs            Wheelchair Mobility     Modified Rankin (Stroke Patients Only)       Balance Overall balance assessment: No apparent balance deficits (not formally assessed)                                           Pertinent Vitals/Pain Pain Assessment: No/denies pain    Home Living Family/patient expects to be discharged to:: Private residence Living Arrangements: Spouse/significant other Available Help at Discharge: Family;Available 24 hours/day Type of Home: House Home Access: Stairs to enter Entrance Stairs-Rails: Right Entrance Stairs-Number of Steps: 2 Home Layout: One level Home Equipment: Walker - 2 wheels;Shower seat - built in      Prior Function Level of Independence: Independent         Comments: Hydrographic surveyor, drives     Journalist, newspaper        Extremity/Trunk Assessment   Upper Extremity Assessment Upper Extremity Assessment: Overall WFL for tasks assessed    Lower Extremity Assessment Lower Extremity Assessment: Overall WFL for tasks assessed    Cervical / Trunk Assessment Cervical / Trunk Assessment: Normal  Communication   Communication: No difficulties  Cognition Arousal/Alertness: Awake/alert Behavior During Therapy: WFL for tasks assessed/performed Overall Cognitive Status: Within Functional Limits for tasks assessed  General Comments      Exercises     Assessment/Plan    PT Assessment Patent does not need any further PT services  PT Problem List         PT Treatment Interventions      PT Goals (Current goals can be found in the Care Plan section)  Acute Rehab PT Goals Patient Stated Goal: return home with family to assist PT Goal Formulation: With patient Time For Goal Achievement: 02/10/20 Potential to Achieve Goals: Good    Frequency     Barriers to discharge        Co-evaluation               AM-PAC PT "6 Clicks" Mobility  Outcome Measure Help needed turning  from your back to your side while in a flat bed without using bedrails?: None Help needed moving from lying on your back to sitting on the side of a flat bed without using bedrails?: None Help needed moving to and from a bed to a chair (including a wheelchair)?: None Help needed standing up from a chair using your arms (e.g., wheelchair or bedside chair)?: None Help needed to walk in hospital room?: None Help needed climbing 3-5 steps with a railing? : None 6 Click Score: 24    End of Session   Activity Tolerance: Patient tolerated treatment well Patient left: in bed;with call bell/phone within reach Nurse Communication: Mobility status PT Visit Diagnosis: Unsteadiness on feet (R26.81);Other abnormalities of gait and mobility (R26.89);Muscle weakness (generalized) (M62.81)    Time: [id number] PT Time Calculation (min) (ACUTE ONLY): 18 min   Charges:   PT Evaluation $PT Eval Low Complexity: 1 Low PT Treatments $Therapeutic Activity: 8-22 mins        9:04 AM, 02/10/20 Lonell Grandchild, MPT Physical Therapist with [facility] [phone] office [phone] mobile phone

## 2020-02-10 NOTE — Discharge Summary (Signed)
Physician Discharge Summary  [name] [name] [id number] DOB: May 22, 1961 DOA: 02/07/2020  PCP: Glenda Chroman, MD  Admit date: 02/07/2020 Discharge date: 02/10/2020  Time spent: 35 minutes  Recommendations for Outpatient Follow-up:  1. Repeat basic metabolic panel to evaluate lites and renal function 2. Reassess blood pressure and further adjust antihypertensive regimen as needed 3. Continue to follow CBGs closely and further adjust hypoglycemic regimen as required.   Discharge Diagnoses:  Active Problems:   Hyperkalemia   AKI (acute kidney injury) ([place])   Chronic diastolic heart failure (HCC)   Class 1 obesity due to excess calories with body mass index (BMI) of 31.0 to 31.9 in adult   Type 2 diabetes mellitus with diabetic neuropathy, without long-term current use of insulin (HCC)   Gastroesophageal reflux disease   Discharge Condition: Stable and improved.  Patient discharged home with instruction to follow-up with PCP in 10 days and cardiology service as previously scheduled.  CODE STATUS: Full code  Diet recommendation: Heart healthy modified carbohydrate diet.  Filed Weights   02/07/20 2341 02/08/20 0537  Weight: 74.8 kg 74.5 kg    History of present illness:  As per H&P written by Dr. Darrick Meigs on 02/08/2020 59 y.o.female,with history of hypertension, chronic diastolic CHF, anxiety, depression, diabetes mellitus type 2, panic disorder, strokecame to hospital with complaints of dizziness and generalized weakness. Patient said that she developed weakness after she was at her hairdresser yesterday with her neck hyperextended to get her hair washed. She also felt weak in her arms and legs. When she got her blood pressure was low in the 90s over 60s. When she called her cardiologist they told her to stop lisinopril. This morning blood pressure was in 0000000 systolic. Patient had not taken Lasix for 2 days but did take her Lasix yesterday to see if it would help. She also took  extra dose of potassium on Friday.  In the ED patient was found to have hyperkalemia with potassium 7.2, acute kidney injury with creatinine 1.40, her baseline creatinine was 0.90 in November 2020. Patient received calcium gluconate, insulin, sodium bicarb and Lokelma in the ED. She had brief episode of chest pain at home. Denies shortness of breath denies chest pain at this time. Denies abdominal pain or dysuria Denies nausea vomiting or diarrhea  Hospital Course:  1-hyperkalemia -In the setting of oral supplementation, increase intake of products rich in potassium (strawberries and other foods) and also acute kidney injury. -Treated and resolved with fluid resuscitation and the use of Lokelma and Kayexalate. -Potassium 4.7 at time of discharge. -No EKG or telemetry abnormalities appreciated. -Patient also received insulin, sodium bicarbonate and calcium gluconate.  2-acute kidney injury on chronic kidney disease a stage II -Baseline creatinine around 0.9 -Creatinine has peaked at 1.4 at time of admission -Continue holding nephrotoxic agents until follow-up with PCP. -Avoid hypotension and maintain adequate hydration. -Creatinine 0.88 at time of discharge. -Repeat basic metabolic panel follow-up visit to reassess renal function and stability.  3-essential hypertension -Currently with soft blood pressure and experiencing orthostatic vital signs -Continue verapamil and metoprolol at adjusted dose -Spironolactone lisinopril has been discontinued. -Advised to follow heart healthy diet and to follow with PCP to further reassess blood pressure and adjust antihypertensive regimen as required.  4-depression/anxiety -Continue Wellbutrin and Celexa -No suicidal ideation or hallucinations currently.  5-type 2 diabetes with nephropathy/neuropathy -Patient advised to follow modified carbohydrate diet. -Resume home hypoglycemic regimen -Continue to follow CBGs -Continue the use of  gabapentin  6-class I  obesity -Body mass index is 31.03 kg/m. -Low calorie diet, portion control and increase physical activity discussed with patient.  7-chronic diastolic heart failure -appears to be stable and compensated -Follow daily weights and low-sodium diet. -advise to follow up with cardiology service as previously scheduled. -continue b-blocker at adjusted dose.  8-gastroesophageal flux disease -Continue PPI.  Procedures: See below for x-ray reports  Consultations:  None  Discharge Exam: Vitals:   02/09/20 2112 02/10/20 0507  BP: (!) 132/51 (!) 108/40  Pulse: 78 (!) 55  Resp: 15 17  Temp: 99 F (37.2 C) (!) 97 F (36.1 C)  SpO2: 98% 96%    General: Afebrile, no chest pain, no nausea, no vomiting.  Reports feeling back to baseline.  No overnight events. Cardiovascular: S1-S2, no rubs, no gallops, no JVD. Respiratory: Good air movement bilaterally.  No using accessory muscles.  Normal respiratory effort. Abdomen: Soft, nontender, nondistended, positive bowel sounds. Extremities: No cyanosis or clubbing.  Discharge Instructions   Discharge Instructions    (HEART FAILURE PATIENTS) Call MD:  Anytime you have any of the following symptoms: 1) 3 pound weight gain in 24 hours or 5 pounds in 1 week 2) shortness of breath, with or without a dry hacking cough 3) swelling in the hands, feet or stomach 4) if you have to sleep on extra pillows at night in order to breathe.   Complete by: As directed    Diet - low sodium heart healthy   Complete by: As directed    Discharge instructions   Complete by: As directed    Medications are prescribed. Maintain adequate hydration. Arrange follow-up with PCP in 10 days. Follow heart healthy diet check your weight on daily basis.     Allergies as of 02/10/2020      Reactions   Codeine Rash   Doxycycline Rash   Erythromycin Rash      Medication List    STOP taking these medications   lisinopril 5 MG tablet Commonly  known as: ZESTRIL   spironolactone 50 MG tablet Commonly known as: ALDACTONE     TAKE these medications   ALPRAZolam 1 MG tablet Commonly known as: XANAX Take 1 mg by mouth 3 (three) times daily as needed.   ALPRAZolam 0.5 MG tablet Commonly known as: XANAX Take 1 tablet (0.5 mg total) by mouth at bedtime.   aspirin 81 MG tablet Take 81 mg by mouth at bedtime.   buPROPion 150 MG 12 hr tablet Commonly known as: WELLBUTRIN SR Take 1 tablet (150 mg total) by mouth daily.   citalopram 40 MG tablet Commonly known as: CELEXA Take 40 mg by mouth daily.   furosemide 20 MG tablet Commonly known as: LASIX Twice daily as needed if weight >3 Lbs   gabapentin 400 MG capsule Commonly known as: NEURONTIN Take 400 mg by mouth 2 (two) times daily.   glimepiride 4 MG tablet Commonly known as: AMARYL Take 4 mg by mouth daily.   metoprolol succinate 25 MG 24 hr tablet Commonly known as: TOPROL-XL Take 1 tablet (25 mg total) by mouth daily. What changed:   medication strength  how much to take   omeprazole 20 MG capsule Commonly known as: PRILOSEC Take 20 mg by mouth at bedtime.   Ozempic (0.25 or 0.5 MG/DOSE) 2 MG/1.5ML Sopn Generic drug: Semaglutide(0.25 or 0.5MG /DOS) Inject 2.5 mLs into the skin every Wednesday.   potassium chloride SA 20 MEQ tablet Commonly known as: KLOR-CON Take 2 tablets (40 mEq total) by mouth  daily as needed. Only if furosemide taken. What changed: additional instructions   pravastatin 20 MG tablet Commonly known as: PRAVACHOL Take 1 tablet (20 mg total) by mouth daily.   verapamil 120 MG CR tablet Commonly known as: CALAN-SR Take 120 mg by mouth at bedtime.      Allergies  Allergen Reactions  . Codeine Rash  . Doxycycline Rash  . Erythromycin Rash   Follow-up Information    Vyas, Dhruv B, MD. Schedule an appointment as soon as possible for a visit in 10 day(s).   Specialty: Internal Medicine Contact information: [place] [address] [phone]        Arnoldo Lenis, MD .   Specialty: Cardiology Contact information: [place] [address] [phone]           The results of significant diagnostics from this hospitalization (including imaging, microbiology, ancillary and laboratory) are listed below for reference.    Significant Diagnostic Studies: No results found.  Microbiology: Recent Results (from the past 240 hour(s))  SARS Coronavirus 2 by RT PCR (hospital order, performed in [facility] hospital lab) Nasopharyngeal Nasopharyngeal Swab     Status: None   Collection Time: 02/08/20  4:46 AM   Specimen: Nasopharyngeal Swab  Result Value Ref Range Status   SARS Coronavirus 2 NEGATIVE NEGATIVE Final    Comment: (NOTE) SARS-CoV-2 target nucleic acids are NOT DETECTED. The SARS-CoV-2 RNA is generally detectable in upper and lower respiratory specimens during the acute phase of infection. The lowest concentration of SARS-CoV-2 viral copies this assay can detect is 250 copies / mL. A negative result does not preclude SARS-CoV-2 infection and should not be used as the sole basis for treatment or other patient management decisions.  A negative result may occur with improper specimen collection / handling, submission of specimen other than nasopharyngeal swab, presence of viral mutation(s) within the areas targeted by this assay, and inadequate number of viral copies (<250 copies / mL). A negative result must be combined with clinical observations, patient history, and epidemiological information. Fact Sheet for Patients:   StrictlyIdeas.no Fact Sheet for Healthcare Providers: BankingDealers.co.za This test is not yet approved or cleared  by the [place] FDA and has been authorized for detection and/or diagnosis of SARS-CoV-2 by FDA under an Emergency Use Authorization (EUA).  This EUA will remain in  effect (meaning this test can be used) for the duration of the COVID-19 declaration under Section 564(b)(1) of the Act, 21 U.S.C. section 360bbb-3(b)(1), unless the authorization is terminated or revoked sooner. Performed at [facility], [address] [place],  [address]      Labs: Basic Metabolic Panel: Recent Labs  Lab 02/08/20 0033 02/08/20 0726 02/09/20 0247 02/09/20 1453 02/10/20 0422  NA 133* 136 133* 133* 138  K 7.2* 6.4* 6.2* 4.8 4.7  CL 110 110 106 104 107  CO2 16* 20* 21* 21* 22  GLUCOSE 163* 136* 116* 232* 119*  BUN 62* 49* 36* 30* 25*  CREATININE 1.40* 1.14* 1.05* 1.05* 0.88  CALCIUM 9.7 10.0 9.0 9.1 9.1   Liver Function Tests: Recent Labs  Lab 02/08/20 0033  AST 33  ALT 38  ALKPHOS 130*  BILITOT 0.6  PROT 7.6  ALBUMIN 4.5   CBC: Recent Labs  Lab 02/08/20 0033  WBC 6.0  NEUTROABS 2.9  HGB 12.2  HCT 37.0  MCV 96.6  PLT 175   BNP (last 3 results) Recent Labs  02/08/20 0034  BNP 7.0    CBG: Recent Labs  Lab 02/09/20 0737 02/09/20 1103 02/09/20 1626 02/09/20 2036 02/10/20 0728  GLUCAP 93 173* 168* 191* 113*    Signed:  Barton Dubois MD.  [facility] 02/10/2020, 9:21 AM

## 2020-02-10 NOTE — Progress Notes (Signed)
Primary Physician/Referring:  Glenda Chroman, MD  Patient ID: [name] [name], female    DOB: [birth year], 59 y.o.   MRN: [id number]  Chief Complaint  Patient presents with  . Hospitalization Follow-up    Hyperkalemia   . Hypertension   HPI:    [name] [name]  is a 59 y.o. Caucasian female with chronic diastolic heart failure, normal coronary arteries by angiography in 2005 and a normal stress echocardiogram in 2014, also negative nuclear stress test in 2018 and has hyperdynamic left ventricle with grade 1 diastolic dysfunction by echocardiogram in January 2020.  Past medical history significant for hypertension, diabetes mellitus and hyperlipidemia.  She was seen in the emergency room on 02/07/2020 when she presented with mild generalized weakness, hypokalemia, acute renal failure.  Spironolactone and lisinopril were held and metoprolol succinate dose was reduced from 50 mg to 25 g daily discharged home with outpatient follow-up.  She is presently feeling well, has mild chronic dyspnea, no chest pain.  Past Medical History:  Diagnosis Date  . Abnormal EKG   . Allergy   . Anxiety    PTSD  . CHF (congestive heart failure) ([place])   . Depression   . Depression   . Diabetes mellitus without complication ([place])   . Dyspnea   . GERD (gastroesophageal reflux disease)   . Hyperlipidemia   . Hypertension    Unspecified  . IBS (irritable bowel syndrome)   . Kidney stone   . Panic disorder 03/20/2019  . Seizures ([place])    Reports last seizure was 30 + years ago   . Sleep apnea    no CPAP worn  . Stroke [facility])    Past Surgical History:  Procedure Laterality Date  . HYSTEROSCOPY  2001   submucous myomectomy  . TOTAL ABDOMINAL HYSTERECTOMY  2002   TAH BSO  pathology showed leiomyoma, adenomyosis, simple and complex hyperplasia without atypia  . TUMOR REMOVAL     from left ear   Family History  Problem Relation Age of Onset  . Cancer Father        Prostate  . High blood pressure Father    . Coronary artery disease Paternal Grandfather   . Uterine cancer Mother   . Irritable bowel syndrome Mother   . Stroke Mother   . Osteoporosis Mother   . Heart disease Maternal Grandmother   . Heart attack Brother 81  . Cancer Brother        Prostate  . Colitis Neg Hx   . Esophageal cancer Neg Hx   . Stomach cancer Neg Hx   . Rectal cancer Neg Hx     Social History   Tobacco Use  . Smoking status: Never Smoker  . Smokeless tobacco: Never Used  Substance Use Topics  . Alcohol use: No    Alcohol/week: 0.0 standard drinks   Marital Status: Married  ROS  Review of Systems  Cardiovascular: Positive for dyspnea on exertion. Negative for chest pain and leg swelling.  Musculoskeletal: Positive for joint pain.  Gastrointestinal: Negative for melena.   Objective  Blood pressure (!) 147/77, pulse 63, resp. rate 16, height 5' 1"  (1.549 m), weight 164 lb (74.4 kg).  Vitals with BMI 02/11/2020 02/10/2020 02/09/2020  Height 5' 1"  - -  Weight 164 lbs - -  BMI 31 - -  Systolic 618 485 927  Diastolic 77 40 51  Pulse 63 55 78    Orthostatic VS for the past 72 hrs (Last 3 readings):  Orthostatic BP Patient Position BP Location Cuff Size Orthostatic Pulse  02/11/20 0958 153/77 Standing Left Arm Normal 65  02/11/20 0957 162/74 Sitting Left Arm Normal 64  02/11/20 0956 173/82 Supine Left Arm Normal 65     Physical Exam  Constitutional:  Short stature, mildly obese and in no acute distress.   HENT:  Head: Atraumatic.  Cardiovascular: Normal rate, regular rhythm and intact distal pulses. Exam reveals no gallop.  No murmur heard. No leg edema. No JVD.    Pulmonary/Chest: Effort normal and breath sounds normal. No accessory muscle usage. No respiratory distress.  Abdominal: Soft.   Laboratory examination:   Recent Labs    02/09/20 0247 02/09/20 1453 02/10/20 0422  NA 133* 133* 138  K 6.2* 4.8 4.7  CL 106 104 107  CO2 21* 21* 22  GLUCOSE 116* 232* 119*  BUN 36* 30* 25*   CREATININE 1.05* 1.05* 0.88  CALCIUM 9.0 9.1 9.1  GFRNONAA 58* 58* >60  GFRAA >60 >60 >60   estimated creatinine clearance is 62.7 mL/min (by C-G formula based on SCr of 0.88 mg/dL).  CMP Latest Ref Rng & Units 02/10/2020 02/09/2020 02/09/2020  Glucose 70 - 99 mg/dL 119(H) 232(H) 116(H)  BUN 6 - 20 mg/dL 25(H) 30(H) 36(H)  Creatinine 0.44 - 1.00 mg/dL 0.88 1.05(H) 1.05(H)  Sodium 135 - 145 mmol/L 138 133(L) 133(L)  Potassium 3.5 - 5.1 mmol/L 4.7 4.8 6.2(H)  Chloride 98 - 111 mmol/L 107 104 106  CO2 22 - 32 mmol/L 22 21(L) 21(L)  Calcium 8.9 - 10.3 mg/dL 9.1 9.1 9.0  Total Protein 6.5 - 8.1 g/dL - - -  Total Bilirubin 0.3 - 1.2 mg/dL - - -  Alkaline Phos 38 - 126 U/L - - -  AST 15 - 41 U/L - - -  ALT 0 - 44 U/L - - -   CBC Latest Ref Rng & Units 02/08/2020 08/04/2019 11/01/2018  WBC 4.0 - 10.5 K/uL 6.0 5.5 7.1  Hemoglobin 12.0 - 15.0 g/dL 12.2 12.5 12.3  Hematocrit 36.0 - 46.0 % 37.0 37.9 40.0  Platelets 150 - 400 K/uL 175 169 189   Lipid Panel  No results found for: CHOL, TRIG, HDL, CHOLHDL, VLDL, LDLCALC, LDLDIRECT HEMOGLOBIN A1C Lab Results  Component Value Date   HGBA1C 7.9 (H) 02/08/2020   MPG 180.03 02/08/2020   TSH No results for input(s): TSH in the last 8760 hours.   External labs:  08/04/2019: eGFR 60. WBC 5.5, RBC 4.2, H/H 37.9/12.5, PLT 169.  TSH 2.596 09/25/2018  10/29/2018: AST 24.5, ALT 29, BUN 12, Creatinine 0.67, Sodium 137, Potassium 3.1, eGFR If NonAfricn Am >60  04/05/2018: Lipid profile: Cholesterol 196, HDL 29, Triglycerides 382, VLDL 76, LDL 90 Hemoglobin A1C 7.9.  TSH 2.28  Medications and allergies   Allergies  Allergen Reactions  . Codeine Rash  . Doxycycline Rash  . Erythromycin Rash     Current Outpatient Medications  Medication Instructions  . ALPRAZolam (XANAX) 1 mg, Oral, 3 times daily PRN  . ALPRAZolam (XANAX) 0.5 mg, Oral, Daily at bedtime  . aspirin 81 mg, Oral, Daily at bedtime  . buPROPion (WELLBUTRIN SR) 150 mg, Oral,  Daily  . citalopram (CELEXA) 40 mg, Oral, Daily  . furosemide (LASIX) 20 MG tablet Twice daily as needed if weight >3 Lbs  . gabapentin (NEURONTIN) 400 mg, Oral, 2 times daily  . glimepiride (AMARYL) 4 mg, Oral, Daily  . metoprolol succinate (TOPROL-XL) 25 mg, Oral, Daily  . omeprazole (PRILOSEC) 20  mg, Oral, Daily at bedtime  . potassium chloride SA (KLOR-CON) 20 MEQ tablet 20 mEq, Oral, Daily PRN, Only if furosemide taken.  . pravastatin (PRAVACHOL) 20 mg, Oral, Daily  . Semaglutide,0.25 or 0.5MG/DOS, (OZEMPIC, 0.25 OR 0.5 MG/DOSE,) 2 MG/1.5ML SOPN 2.5 mLs, Subcutaneous, Every Wed  . spironolactone (ALDACTONE) 50 mg, Oral, Daily  . verapamil (CALAN-SR) 120 mg, Oral, Daily at bedtime   Radiology:   No results found.  Cardiac Studies:   Lexiscan Myoview Stress Test 02/23/2017: Blood pressure demonstrated a hypertensive response to exercise. There was no ST segment deviation noted during stress. The study is normal. This is a low risk study. Nuclear stress EF: 85%  Echocardiogram 09/25/2018:   Left ventricle: The cavity size was normal. Wall thickness was  increased in a pattern of mild LVH. Systolic function was normal. The estimated ejection fraction was in the range of 60% to 65%.   Wall motion was normal; there were no regional wall motion  abnormalities. Doppler parameters are consistent with abnormal   left ventricular relaxation (grade 1 diastolic dysfunction). - Aortic valve: Mildly calcified annulus. Trileaflet. - Mitral valve: Mildly calcified annulus. There was trivial   regurgitation. - Right atrium: Central venous pressure (est): 3 mm Hg. - Atrial septum: There was increased thickness of the septum,   consistent with lipomatous hypertrophy. - Tricuspid valve: There was trivial regurgitation. - Pulmonary arteries: Systolic pressure could not be accurately   estimated. - Pericardium, extracardiac: A prominent pericardial fat pad was   present. Cannot exclude small to  moderate sized, organized   circumferential pericardial effusion. Could consider CT imaging.  Carotid artery duplex 01/16/2019:  Bilateral 1- 39% stenosis.  Antegrade bilateral vertebral flow.  EKG  01/15/2020: Normal sinus rhythm at rate of 69 bpm, normal axis, early repolarization.  No evidence of ischemia.  Isolated T wave inversion in aVL.  Compared to 05/29/2019, left axis deviation not present.   Assessment     ICD-10-CM   1. Orthostatic hypotension  I95.1   2. Essential hypertension  B01 Basic metabolic panel  3. Dyspnea on exertion  R06.00   4. Chronic diastolic heart failure (HCC)  I50.32 potassium chloride SA (KLOR-CON) 20 MEQ tablet     Outpatient Encounter Medications as of 02/11/2020  Medication Sig  . ALPRAZolam (XANAX) 0.5 MG tablet Take 1 tablet (0.5 mg total) by mouth at bedtime.  . ALPRAZolam (XANAX) 1 MG tablet Take 1 mg by mouth 3 (three) times daily as needed.   Marland Kitchen aspirin 81 MG tablet Take 81 mg by mouth at bedtime.   Marland Kitchen buPROPion (WELLBUTRIN SR) 150 MG 12 hr tablet Take 1 tablet (150 mg total) by mouth daily.  . citalopram (CELEXA) 40 MG tablet Take 40 mg by mouth daily.  . furosemide (LASIX) 20 MG tablet Twice daily as needed if weight >3 Lbs (Patient taking differently: 40 mg. Twice daily as needed if weight >3 Lbs)  . gabapentin (NEURONTIN) 400 MG capsule Take 400 mg by mouth 2 (two) times daily.  Marland Kitchen glimepiride (AMARYL) 4 MG tablet Take 4 mg by mouth daily.  . metoprolol succinate (TOPROL-XL) 25 MG 24 hr tablet Take 1 tablet (25 mg total) by mouth daily.  Marland Kitchen omeprazole (PRILOSEC) 20 MG capsule Take 20 mg by mouth at bedtime.   . potassium chloride SA (KLOR-CON) 20 MEQ tablet Take 1 tablet (20 mEq total) by mouth daily as needed (With Furosemide). Only if furosemide taken.  . pravastatin (PRAVACHOL) 20 MG tablet Take 1 tablet (  20 mg total) by mouth daily.  . Semaglutide,0.25 or 0.5MG/DOS, (OZEMPIC, 0.25 OR 0.5 MG/DOSE,) 2 MG/1.5ML SOPN Inject 2.5 mLs into the skin  every Wednesday.   Marland Kitchen spironolactone (ALDACTONE) 50 MG tablet Take 50 mg by mouth daily.  . verapamil (CALAN-SR) 120 MG CR tablet Take 120 mg by mouth at bedtime.   . [DISCONTINUED] potassium chloride SA (KLOR-CON) 20 MEQ tablet Take 2 tablets (40 mEq total) by mouth daily as needed. Only if furosemide taken.  . [DISCONTINUED] 0.9 %  sodium chloride infusion   . [DISCONTINUED] ALPRAZolam Duanne Moron) tablet 0.5 mg   . [DISCONTINUED] ALPRAZolam (XANAX) tablet 1 mg   . [DISCONTINUED] aspirin chewable tablet 81 mg   . [DISCONTINUED] buPROPion (WELLBUTRIN SR) 12 hr tablet 150 mg   . [DISCONTINUED] citalopram (CELEXA) tablet 40 mg   . [DISCONTINUED] gabapentin (NEURONTIN) capsule 400 mg   . [DISCONTINUED] insulin aspart (novoLOG) injection 0-9 Units   . [DISCONTINUED] metoprolol succinate (TOPROL-XL) 24 hr tablet 25 mg   . [DISCONTINUED] ondansetron (ZOFRAN) injection 4 mg   . [DISCONTINUED] ondansetron (ZOFRAN) tablet 4 mg   . [DISCONTINUED] pantoprazole (PROTONIX) EC tablet 40 mg   . [DISCONTINUED] pravastatin (PRAVACHOL) tablet 20 mg   . [DISCONTINUED] sodium chloride flush (NS) 0.9 % injection 3 mL   . [DISCONTINUED] sodium chloride flush (NS) 0.9 % injection 3 mL   . [DISCONTINUED] verapamil (CALAN-SR) CR tablet 120 mg    No facility-administered encounter medications on file as of 02/11/2020.    Recommendations:   [name] [name]  is a  59 y.o. Caucasian female with chronic diastolic heart failure, normal coronary arteries by angiography in 2005 and a normal stress echocardiogram in 2014, also negative nuclear stress test in 2018 and has hyperdynamic left ventricle with grade 1 diastolic dysfunction by echocardiogram in January 2020.  Past medical history significant for hypertension, diabetes mellitus and hyperlipidemia.  She was seen in the emergency room on 02/07/2020 when she presented with mild generalized weakness, hypokalemia, acute renal failure.  Hyperkalemia with related to being  stationary intake of strawberries along with that she had taken furosemide for weight gain and had also taken with potassium supplements.  She was discharged from the hospital yesterday.  She is now aware of the interaction of spironolactone, potassium and lisinopril along with excess intake of fluids.  I discussed with her extensively regarding uncontrolled diabetes mellitus, her main issues would be diabetes with peripheral manifestation including autonomic insufficiency.  Today she is mildly orthostatic as well.  I have discussed with her regarding orthostatic hypotension and supine hypertension, I did not make any changes to her medications although she has resumed her spironolactone-it has kept her out of leg edema and also acute decompensated chronic diastolic heart failure.  I would like to check BMP in about 10 days.  I will continue to hold lisinopril for now.   For now she will continue taking furosemide only on a as needed basis for weight gain of 3 pounds and also will reduce potassium supplements from 102mq to 20 mEq to be taken on a as needed basis.  Avoidance of fruit and foods rich in potassium discussed.  Although blood pressure is elevated, metoprolol succinate dose was reduced from 50 mg to 25 mg dose, will continue the same for now and would like to see her back in 6 weeks for management of hypertension specifically and chronic diastolic heart failure.  This was a 40-minute encounter with review of hospital records and management  of her complex medical issues.  Adrian Prows, MD, [facility] 02/11/2020, 10:30 AM [facility] Pager: [phone] Office: [phone]

## 2020-02-11 ENCOUNTER — Ambulatory Visit: Payer: 59 | Admitting: Cardiology

## 2020-02-11 ENCOUNTER — Encounter: Payer: Self-pay | Admitting: Cardiology

## 2020-02-11 ENCOUNTER — Other Ambulatory Visit: Payer: Self-pay

## 2020-02-11 VITALS — BP 147/77 | HR 63 | Resp 16 | Ht 61.0 in | Wt 164.0 lb

## 2020-02-11 DIAGNOSIS — I5032 Chronic diastolic (congestive) heart failure: Secondary | ICD-10-CM | POA: Diagnosis not present

## 2020-02-11 DIAGNOSIS — I951 Orthostatic hypotension: Secondary | ICD-10-CM | POA: Diagnosis not present

## 2020-02-11 DIAGNOSIS — R0600 Dyspnea, unspecified: Secondary | ICD-10-CM

## 2020-02-11 DIAGNOSIS — I10 Essential (primary) hypertension: Secondary | ICD-10-CM

## 2020-02-11 DIAGNOSIS — R0609 Other forms of dyspnea: Secondary | ICD-10-CM | POA: Diagnosis not present

## 2020-02-12 DIAGNOSIS — E875 Hyperkalemia: Secondary | ICD-10-CM | POA: Diagnosis not present

## 2020-02-12 DIAGNOSIS — Z299 Encounter for prophylactic measures, unspecified: Secondary | ICD-10-CM | POA: Diagnosis not present

## 2020-02-12 DIAGNOSIS — Z6830 Body mass index (BMI) 30.0-30.9, adult: Secondary | ICD-10-CM | POA: Diagnosis not present

## 2020-02-12 DIAGNOSIS — E1165 Type 2 diabetes mellitus with hyperglycemia: Secondary | ICD-10-CM | POA: Diagnosis not present

## 2020-02-12 DIAGNOSIS — Z09 Encounter for follow-up examination after completed treatment for conditions other than malignant neoplasm: Secondary | ICD-10-CM | POA: Diagnosis not present

## 2020-02-12 DIAGNOSIS — E860 Dehydration: Secondary | ICD-10-CM | POA: Diagnosis not present

## 2020-02-12 DIAGNOSIS — I10 Essential (primary) hypertension: Secondary | ICD-10-CM | POA: Diagnosis not present

## 2020-02-18 ENCOUNTER — Other Ambulatory Visit: Payer: Self-pay | Admitting: Cardiology

## 2020-02-18 DIAGNOSIS — I10 Essential (primary) hypertension: Secondary | ICD-10-CM

## 2020-02-18 DIAGNOSIS — I5032 Chronic diastolic (congestive) heart failure: Secondary | ICD-10-CM

## 2020-02-24 LAB — BASIC METABOLIC PANEL
BUN/Creatinine Ratio: 23 (ref 12–28)
BUN: 20 mg/dL (ref 8–27)
CO2: 22 mmol/L (ref 20–29)
Calcium: 10.3 mg/dL (ref 8.7–10.3)
Chloride: 103 mmol/L (ref 96–106)
Creatinine, Ser: 0.88 mg/dL (ref 0.57–1.00)
GFR calc Af Amer: 83 mL/min/{1.73_m2} (ref >59)
GFR calc non Af Amer: 72 mL/min/{1.73_m2} (ref >59)
Glucose: 156 mg/dL — ABNORMAL HIGH (ref 65–99)
Potassium: 4.9 mmol/L (ref 3.5–5.2)
Sodium: 141 mmol/L (ref 134–144)

## 2020-02-24 NOTE — Progress Notes (Signed)
Renal function normalized and potassium levels normal. COntinue present meds

## 2020-02-27 DIAGNOSIS — I5030 Unspecified diastolic (congestive) heart failure: Secondary | ICD-10-CM | POA: Diagnosis not present

## 2020-02-27 DIAGNOSIS — R0602 Shortness of breath: Secondary | ICD-10-CM | POA: Diagnosis not present

## 2020-02-27 DIAGNOSIS — I110 Hypertensive heart disease with heart failure: Secondary | ICD-10-CM | POA: Diagnosis not present

## 2020-02-27 DIAGNOSIS — M7989 Other specified soft tissue disorders: Secondary | ICD-10-CM | POA: Diagnosis not present

## 2020-03-11 ENCOUNTER — Other Ambulatory Visit: Payer: Self-pay | Admitting: Cardiology

## 2020-03-11 DIAGNOSIS — I5032 Chronic diastolic (congestive) heart failure: Secondary | ICD-10-CM

## 2020-03-11 DIAGNOSIS — I10 Essential (primary) hypertension: Secondary | ICD-10-CM

## 2020-03-24 ENCOUNTER — Encounter: Payer: Self-pay | Admitting: Cardiology

## 2020-03-24 ENCOUNTER — Other Ambulatory Visit: Payer: Self-pay

## 2020-03-24 ENCOUNTER — Ambulatory Visit: Payer: 59 | Admitting: Cardiology

## 2020-03-24 VITALS — BP 130/76 | HR 82 | Resp 16 | Ht 61.0 in | Wt 164.6 lb

## 2020-03-24 DIAGNOSIS — I10 Essential (primary) hypertension: Secondary | ICD-10-CM

## 2020-03-24 DIAGNOSIS — I5032 Chronic diastolic (congestive) heart failure: Secondary | ICD-10-CM | POA: Diagnosis not present

## 2020-03-24 NOTE — Progress Notes (Signed)
Primary Physician/Referring:  Glenda Chroman, MD  Patient ID: [name] [name], female    DOB: 22-Jan-1961, 59 y.o.   MRN: [id number]  Chief Complaint  Patient presents with  . Hypertension  . Acute Renal Failure  . Follow-up    6 week   HPI:    [name] [name]  is a 59 y.o. Caucasian female with chronic diastolic heart failure, normal coronary arteries by angiography in 2005 and a normal stress echocardiogram in 2014, also negative nuclear stress test in 2018 and has hyperdynamic left ventricle with grade 1 diastolic dysfunction by echocardiogram in January 2020.  Past medical history significant for hypertension, diabetes mellitus and hyperlipidemia.  She was seen in the emergency room on 02/07/2020 when she presented with mild generalized weakness, hypokalemia, acute renal failure.  Spironolactone and lisinopril were held and metoprolol succinate dose was reduced from 50 mg to 25 mg daily discharged home with outpatient follow-up.   She is presently feeling well, has mild chronic dyspnea, no chest pain.  I had further reduce the dose of Aldactone to 25 mg daily from 50 as she had developed renal insufficiency, made furosemide and potassium supplements to be taken on a as needed basis for heart failure.  He now presents for 4-week follow-up.  Symptoms of dyspnea has improved, she has noted blood pressure to be well controlled, and she is trying her best to lose weight.  Past Medical History:  Diagnosis Date  . Abnormal EKG   . Allergy   . Anxiety    PTSD  . CHF (congestive heart failure) ([place])   . Depression   . Depression   . Diabetes mellitus without complication ([place])   . Dyspnea   . GERD (gastroesophageal reflux disease)   . Hyperlipidemia   . Hypertension    Unspecified  . IBS (irritable bowel syndrome)   . Kidney stone   . Panic disorder 03/20/2019  . Seizures ([place])    Reports last seizure was 30 + years ago   . Sleep apnea    no CPAP worn  . Stroke [facility])    Past Surgical  History:  Procedure Laterality Date  . HYSTEROSCOPY  2001   submucous myomectomy  . TOTAL ABDOMINAL HYSTERECTOMY  2002   TAH BSO  pathology showed leiomyoma, adenomyosis, simple and complex hyperplasia without atypia  . TUMOR REMOVAL     from left ear   Family History  Problem Relation Age of Onset  . Cancer Father        Prostate  . High blood pressure Father   . Coronary artery disease Paternal Grandfather   . Uterine cancer Mother   . Irritable bowel syndrome Mother   . Stroke Mother   . Osteoporosis Mother   . Heart disease Maternal Grandmother   . Heart attack Brother 67  . Cancer Brother        Prostate  . Colitis Neg Hx   . Esophageal cancer Neg Hx   . Stomach cancer Neg Hx   . Rectal cancer Neg Hx     Social History   Tobacco Use  . Smoking status: Never Smoker  . Smokeless tobacco: Never Used  Substance Use Topics  . Alcohol use: No    Alcohol/week: 0.0 standard drinks   Marital Status: Married  ROS  Review of Systems  Cardiovascular: Positive for dyspnea on exertion. Negative for chest pain and leg swelling.  Musculoskeletal: Positive for joint pain.  Gastrointestinal: Negative for melena.  Objective  Blood pressure 130/76, pulse 82, resp. rate 16, height _0  (1.549 m), weight 164 lb 9.6 oz (74.7 kg), SpO2 98 %.  Vitals with BMI 03/24/2020 02/11/2020 02/10/2020  Height _1  _2  -  Weight 164 lbs 10 oz 164 lbs -  BMI 37.16 31 -  Systolic 967 893 810  Diastolic 76 77 40  Pulse 82 63 55    Physical Exam Constitutional:      Comments: Short stature, mildly obese and in no acute distress.   HENT:     Head: Atraumatic.  Cardiovascular:     Rate and Rhythm: Normal rate and regular rhythm.     Pulses: Intact distal pulses.     Heart sounds: No murmur heard.  No gallop.      Comments: No leg edema. No JVD.   Pulmonary:     Effort: Pulmonary effort is normal. No accessory muscle usage or respiratory distress.     Breath sounds: Normal breath sounds.   Abdominal:     Palpations: Abdomen is soft.    Laboratory examination:   Recent Labs    02/09/20 1453 02/10/20 0422 02/23/20 1141  NA 133* 138 141  K 4.8 4.7 4.9  CL 104 107 103  CO2 21* 22 22  GLUCOSE 232* 119* 156*  BUN 30* 25* 20  CREATININE 1.05* 0.88 0.88  CALCIUM 9.1 9.1 10.3  GFRNONAA 58* >60 72  GFRAA >60 >60 83   CrCl cannot be calculated (Patient's most recent lab result is older than the maximum 21 days allowed.).  CMP Latest Ref Rng & Units 02/23/2020 02/10/2020 02/09/2020  Glucose 65 - 99 mg/dL 156(H) 119(H) 232(H)  BUN 8 - 27 mg/dL 20 25(H) 30(H)  Creatinine 0.57 - 1.00 mg/dL 0.88 0.88 1.05(H)  Sodium 134 - 144 mmol/L 141 138 133(L)  Potassium 3.5 - 5.2 mmol/L 4.9 4.7 4.8  Chloride 96 - 106 mmol/L 103 107 104  CO2 20 - 29 mmol/L 22 22 21(L)  Calcium 8.7 - 10.3 mg/dL 10.3 9.1 9.1  Total Protein 6.5 - 8.1 g/dL - - -  Total Bilirubin 0.3 - 1.2 mg/dL - - -  Alkaline Phos 38 - 126 U/L - - -  AST 15 - 41 U/L - - -  ALT 0 - 44 U/L - - -   CBC Latest Ref Rng & Units 02/08/2020 08/04/2019 11/01/2018  WBC 4.0 - 10.5 K/uL 6.0 5.5 7.1  Hemoglobin 12.0 - 15.0 g/dL 12.2 12.5 12.3  Hematocrit 36 - 46 % 37.0 37.9 40.0  Platelets 150 - 400 K/uL 175 169 189   Lipid Panel  No results found for: CHOL, TRIG, HDL, CHOLHDL, VLDL, LDLCALC, LDLDIRECT HEMOGLOBIN A1C Lab Results  Component Value Date   HGBA1C 7.9 (H) 02/08/2020   MPG 180.03 02/08/2020   TSH No results for input(s): TSH in the last 8760 hours.  External labs:   08/04/2019: eGFR 60. WBC 5.5, RBC 4.2, H/H 37.9/12.5, PLT 169.  TSH 2.596 09/25/2018  10/29/2018: AST 24.5, ALT 29, BUN 12, Creatinine 0.67, Sodium 137, Potassium 3.1, eGFR If NonAfricn Am >60  04/05/2018: Lipid profile: Cholesterol 196, HDL 29, Triglycerides 382, VLDL 76, LDL 90 Hemoglobin A1C 7.9.  TSH 2.28  Medications and allergies   Allergies  Allergen Reactions  . Codeine Rash  . Doxycycline Rash  . Erythromycin Rash     Current  Outpatient Medications  Medication Instructions  . ALPRAZolam (XANAX) 1 mg, Oral, 3 times daily PRN  . ALPRAZolam (  XANAX) 0.5 mg, Oral, Daily at bedtime  . aspirin 81 mg, Oral, Daily at bedtime  . buPROPion (WELLBUTRIN SR) 150 mg, Oral, Daily  . citalopram (CELEXA) 40 mg, Oral, Daily  . furosemide (LASIX) 20 MG tablet Twice daily as needed if weight >3 Lbs  . gabapentin (NEURONTIN) 400 mg, Oral, 2 times daily  . glimepiride (AMARYL) 4 mg, Oral, Daily  . metoprolol succinate (TOPROL-XL) 25 mg, Oral, Daily  . omeprazole (PRILOSEC) 20 mg, Oral, Daily at bedtime  . potassium chloride SA (KLOR-CON) 20 MEQ tablet 20 mEq, Oral, Daily PRN, Only if furosemide taken.  . pravastatin (PRAVACHOL) 20 mg, Oral, Daily  . Semaglutide,0.25 or 0.5MG/DOS, (OZEMPIC, 0.25 OR 0.5 MG/DOSE,) 2 MG/1.5ML SOPN 2.5 mLs, Subcutaneous, Every Wed  . spironolactone (ALDACTONE) 50 MG tablet TAKE 1 TABLET BY MOUTH EVERY MORNING  . verapamil (CALAN-SR) 120 mg, Oral, Daily at bedtime   Radiology:   No results found.  Cardiac Studies:   Lexiscan Myoview Stress Test 02/23/2017: Blood pressure demonstrated a hypertensive response to exercise. There was no ST segment deviation noted during stress. The study is normal. This is a low risk study. Nuclear stress EF: 85%  Echocardiogram 09/25/2018:   Left ventricle: The cavity size was normal. Wall thickness was  increased in a pattern of mild LVH. Systolic function was normal. The estimated ejection fraction was in the range of 60% to 65%.   Wall motion was normal; there were no regional wall motion  abnormalities. Doppler parameters are consistent with abnormal   left ventricular relaxation (grade 1 diastolic dysfunction). - Aortic valve: Mildly calcified annulus. Trileaflet. - Mitral valve: Mildly calcified annulus. There was trivial   regurgitation. - Right atrium: Central venous pressure (est): 3 mm Hg. - Atrial septum: There was increased thickness of the septum,    consistent with lipomatous hypertrophy. - Tricuspid valve: There was trivial regurgitation. - Pulmonary arteries: Systolic pressure could not be accurately   estimated. - Pericardium, extracardiac: A prominent pericardial fat pad was   present. Cannot exclude small to moderate sized, organized   circumferential pericardial effusion. Could consider CT imaging.  Carotid artery duplex 01/16/2019:  Bilateral 1- 39% stenosis.  Antegrade bilateral vertebral flow.  EKG  01/15/2020: Normal sinus rhythm at rate of 69 bpm, normal axis, early repolarization.  No evidence of ischemia.  Isolated T wave inversion in aVL.  Compared to 05/29/2019, left axis deviation not present.   Assessment     ICD-10-CM   1. Essential hypertension  N98 Basic metabolic panel  2. Chronic diastolic heart failure (HCC)  I50.32 Brain natriuretic peptide    There are no discontinued medications.   Recommendations:   [name] [name]  is a  59 y.o. Caucasian female with amxiety, chronic diastolic heart failure, normal coronary arteries by angiography in 2005 and a normal stress echocardiogram in 2014, also negative nuclear stress test in 2018 and has hyperdynamic left ventricle with grade 1 diastolic dysfunction by echocardiogram in January 2020.  Past medical history significant for hypertension, diabetes mellitus and hyperlipidemia.  She was seen in the emergency room on 02/07/2020 when she presented with mild generalized weakness, hypokalemia, acute renal failure .    Patient was eating excessive amounts of fruits and berries when this happened.  Now she has made lifestyle changes.  Today she is not in any acute decompensated heart failure.  Review of the labs, renal function has returned to normal and her blood pressure is also well controlled.  I will recheck  BMP and also BNP in 1 month.  I will see her back in 3 months for follow-up of heart failure, hypertension.  Weight loss discussed in detail.   Adrian Prows, MD,  [facility] 03/24/2020, 1:19 PM Office: [phone]

## 2020-04-04 ENCOUNTER — Other Ambulatory Visit: Payer: Self-pay | Admitting: Cardiology

## 2020-04-04 DIAGNOSIS — I10 Essential (primary) hypertension: Secondary | ICD-10-CM

## 2020-04-04 DIAGNOSIS — I5032 Chronic diastolic (congestive) heart failure: Secondary | ICD-10-CM

## 2020-04-08 ENCOUNTER — Other Ambulatory Visit: Payer: Self-pay | Admitting: Cardiology

## 2020-04-08 DIAGNOSIS — E782 Mixed hyperlipidemia: Secondary | ICD-10-CM

## 2020-04-26 ENCOUNTER — Encounter: Payer: Self-pay | Admitting: Cardiology

## 2020-04-26 ENCOUNTER — Ambulatory Visit: Payer: 59 | Admitting: Cardiology

## 2020-04-26 ENCOUNTER — Other Ambulatory Visit: Payer: Self-pay | Admitting: Cardiology

## 2020-04-26 ENCOUNTER — Other Ambulatory Visit: Payer: Self-pay

## 2020-04-26 VITALS — BP 133/65 | HR 75 | Resp 15 | Ht 61.0 in | Wt 161.0 lb

## 2020-04-26 DIAGNOSIS — R0602 Shortness of breath: Secondary | ICD-10-CM

## 2020-04-26 DIAGNOSIS — I10 Essential (primary) hypertension: Secondary | ICD-10-CM | POA: Diagnosis not present

## 2020-04-26 DIAGNOSIS — R079 Chest pain, unspecified: Secondary | ICD-10-CM

## 2020-04-26 DIAGNOSIS — R071 Chest pain on breathing: Secondary | ICD-10-CM

## 2020-04-26 DIAGNOSIS — I5032 Chronic diastolic (congestive) heart failure: Secondary | ICD-10-CM | POA: Diagnosis not present

## 2020-04-26 DIAGNOSIS — M79605 Pain in left leg: Secondary | ICD-10-CM

## 2020-04-26 DIAGNOSIS — M5432 Sciatica, left side: Secondary | ICD-10-CM | POA: Diagnosis not present

## 2020-04-26 NOTE — Progress Notes (Signed)
Primary Physician/Referring:  Glenda Chroman, MD  Patient ID: [name] [name], female    DOB: 1961-02-01, 59 y.o.   MRN: [id number]  Chief Complaint  Patient presents with  . Follow-up    1 month  . Chest Pain   HPI:    [name] [name]  is a 59 y.o. Caucasian female with chronic diastolic heart failure, normal coronary arteries by angiography in 2005 and a normal stress echocardiogram in 2014, also negative nuclear stress test in 2018 and has hyperdynamic left ventricle with grade 1 diastolic dysfunction by echocardiogram in January 2020.  Past medical history significant for hypertension, diabetes mellitus and hyperlipidemia.  She was seen in the emergency room on 02/07/2020 when she presented with mild generalized weakness, hypokalemia, acute renal failure.  Spironolactone and lisinopril were held and metoprolol succinate dose was reduced from 50 mg to 25 mg daily discharged home with outpatient follow-up. Up on follow up I further reduced the dose of Aldactone to 25 mg daily from 50 as she had developed renal insufficiency, made furosemide and potassium supplements to be taken on a as needed basis for heart failure.  Patient presents today with concern of left posterior leg pain radiating from hip to ankle with rapid onset last night while trying to get into bed. States the leg pain was severe and felt "burning". The pain was followed by non-pleutiric, non-radiating substernal chest pain lasting 5-10 minutes, relieved with rest. Reports shortness of breath and near syncope associated with this leg pain. Left leg pain had continued and is worse with movement. No chest pain or shortness of breath since last night. No leg swelling, no PND, no erythema, no warmth of skin. Distal pulses strong bilaterally.   At last visit ordered BNP and BMP, patient reports she plans to get this blood work done this week. Informed the patient that we will review the results and let her know. Patient's blood pressure is  well controlled today.   Past Medical History:  Diagnosis Date  . Abnormal EKG   . Allergy   . Anxiety    PTSD  . CHF (congestive heart failure) ([place])   . Depression   . Depression   . Diabetes mellitus without complication ([place])   . Dyspnea   . GERD (gastroesophageal reflux disease)   . Hyperlipidemia   . Hypertension    Unspecified  . IBS (irritable bowel syndrome)   . Kidney stone   . Panic disorder 03/20/2019  . Seizures ([place])    Reports last seizure was 30 + years ago   . Sleep apnea    no CPAP worn  . Stroke [facility])    Past Surgical History:  Procedure Laterality Date  . HYSTEROSCOPY  2001   submucous myomectomy  . TOTAL ABDOMINAL HYSTERECTOMY  2002   TAH BSO  pathology showed leiomyoma, adenomyosis, simple and complex hyperplasia without atypia  . TUMOR REMOVAL     from left ear   Family History  Problem Relation Age of Onset  . Cancer Father        Prostate  . High blood pressure Father   . Coronary artery disease Paternal Grandfather   . Uterine cancer Mother   . Irritable bowel syndrome Mother   . Stroke Mother   . Osteoporosis Mother   . Heart disease Maternal Grandmother   . Heart attack Brother 28  . Cancer Brother        Prostate  . Colitis Neg Hx   .  Esophageal cancer Neg Hx   . Stomach cancer Neg Hx   . Rectal cancer Neg Hx     Social History   Tobacco Use  . Smoking status: Never Smoker  . Smokeless tobacco: Never Used  Substance Use Topics  . Alcohol use: No    Alcohol/week: 0.0 standard drinks   Marital Status: Married  ROS  Review of Systems  Cardiovascular: Negative for chest pain, dyspnea on exertion, leg swelling, orthopnea, palpitations and paroxysmal nocturnal dyspnea.  Respiratory: Positive for shortness of breath. Negative for hemoptysis.   Musculoskeletal: Positive for joint pain. Negative for joint swelling and muscle weakness.  Gastrointestinal: Negative for melena.   Objective  Blood pressure 133/65, pulse 75, resp.  rate 15, height 5' 1"  (1.549 m), weight 161 lb (73 kg), SpO2 97 %.  Vitals with BMI 04/26/2020 03/24/2020 02/11/2020  Height 5' 1"  5' 1"  5' 1"   Weight 161 lbs 164 lbs 10 oz 164 lbs  BMI 50.53 97.67 31  Systolic 341 937 902  Diastolic 65 76 77  Pulse 75 82 63    Physical Exam Constitutional:      General: She is not in acute distress.    Comments: Short stature, mildly obese and in no acute distress.   HENT:     Head: Atraumatic.  Cardiovascular:     Rate and Rhythm: Normal rate and regular rhythm.     Pulses: Intact distal pulses.          Radial pulses are 2+ on the right side and 2+ on the left side.       Femoral pulses are 2+ on the right side and 2+ on the left side.      Popliteal pulses are 1+ on the right side and 1+ on the left side.       Dorsalis pedis pulses are 2+ on the right side and 2+ on the left side.       Posterior tibial pulses are 2+ on the right side and 2+ on the left side.     Heart sounds: No murmur heard.  No gallop.      Comments: No leg edema. No JVD.   Pulmonary:     Effort: Pulmonary effort is normal. No accessory muscle usage or respiratory distress.     Breath sounds: Normal breath sounds. No wheezing, rhonchi or rales.  Abdominal:     Palpations: Abdomen is soft.  Musculoskeletal:        General: Tenderness (posterior left leg pain from hip to ankle. Pain exacerabted by straight leg raise.) present. No swelling.     Right lower leg: No edema.     Left lower leg: No edema.  Skin:    Findings: No erythema, lesion or rash.    Laboratory examination:   Recent Labs    02/09/20 1453 02/10/20 0422 02/23/20 1141  NA 133* 138 141  K 4.8 4.7 4.9  CL 104 107 103  CO2 21* 22 22  GLUCOSE 232* 119* 156*  BUN 30* 25* 20  CREATININE 1.05* 0.88 0.88  CALCIUM 9.1 9.1 10.3  GFRNONAA 58* >60 72  GFRAA >60 >60 83   CrCl cannot be calculated (Patient's most recent lab result is older than the maximum 21 days allowed.).  CMP Latest Ref Rng & Units  02/23/2020 02/10/2020 02/09/2020  Glucose 65 - 99 mg/dL 156(H) 119(H) 232(H)  BUN 8 - 27 mg/dL 20 25(H) 30(H)  Creatinine 0.57 - 1.00 mg/dL 0.88 0.88 1.05(H)  Sodium  134 - 144 mmol/L 141 138 133(L)  Potassium 3.5 - 5.2 mmol/L 4.9 4.7 4.8  Chloride 96 - 106 mmol/L 103 107 104  CO2 20 - 29 mmol/L 22 22 21(L)  Calcium 8.7 - 10.3 mg/dL 10.3 9.1 9.1  Total Protein 6.5 - 8.1 g/dL - - -  Total Bilirubin 0.3 - 1.2 mg/dL - - -  Alkaline Phos 38 - 126 U/L - - -  AST 15 - 41 U/L - - -  ALT 0 - 44 U/L - - -   CBC Latest Ref Rng & Units 02/08/2020 08/04/2019 11/01/2018  WBC 4.0 - 10.5 K/uL 6.0 5.5 7.1  Hemoglobin 12.0 - 15.0 g/dL 12.2 12.5 12.3  Hematocrit 36 - 46 % 37.0 37.9 40.0  Platelets 150 - 400 K/uL 175 169 189   Lipid Panel  No results found for: CHOL, TRIG, HDL, CHOLHDL, VLDL, LDLCALC, LDLDIRECT HEMOGLOBIN A1C Lab Results  Component Value Date   HGBA1C 7.9 (H) 02/08/2020   MPG 180.03 02/08/2020   TSH No results for input(s): TSH in the last 8760 hours.  External labs:  08/04/2019: eGFR 60. WBC 5.5, RBC 4.2, H/H 37.9/12.5, PLT 169.  TSH 2.596 09/25/2018  Labs 06/12/2019: Serum glucose 212 mg, BUN 15, creatinine 0.77, eGFR >55m potassium 4.1, CMP otherwise normal. Total cholesterol 182, triglycerides 426, HDL 28, LDL 85. CBC normal, TSH normal.  10/29/2018: AST 24.5, ALT 29, BUN 12, Creatinine 0.67, Sodium 137, Potassium 3.1, eGFR If NonAfricn Am >60  04/05/2018: Lipid profile: Cholesterol 196, HDL 29, Triglycerides 382, VLDL 76, LDL 90 Hemoglobin A1C 7.9.  TSH 2.28  Medications and allergies   Allergies  Allergen Reactions  . Codeine Rash  . Doxycycline Rash  . Erythromycin Rash     Current Outpatient Medications  Medication Instructions  . ALPRAZolam (XANAX) 1 mg, Oral, 3 times daily PRN  . ALPRAZolam (XANAX) 0.5 mg, Oral, Daily at bedtime  . aspirin 81 mg, Oral, Daily at bedtime  . buPROPion (WELLBUTRIN SR) 150 mg, Oral, Daily  . Cholecalciferol (VITAMIN D) 125  MCG (5000 UT) CAPS Oral, Daily  . citalopram (CELEXA) 40 mg, Oral, Daily  . furosemide (LASIX) 20 MG tablet Twice daily as needed if weight >3 Lbs  . gabapentin (NEURONTIN) 400 mg, Oral, 2 times daily  . glimepiride (AMARYL) 4 mg, Oral, Daily  . metoprolol succinate (TOPROL-XL) 25 mg, Oral, Daily  . omeprazole (PRILOSEC) 20 mg, Oral, Daily at bedtime  . potassium chloride SA (KLOR-CON) 20 MEQ tablet 20 mEq, Oral, Daily PRN, Only if furosemide taken.  . pravastatin (PRAVACHOL) 20 MG tablet TAKE 1 TABLET BY MOUTH EVERY DAY  . Semaglutide,0.25 or 0.5MG/DOS, (OZEMPIC, 0.25 OR 0.5 MG/DOSE,) 2 MG/1.5ML SOPN 2.5 mLs, Subcutaneous, Every Wed  . spironolactone (ALDACTONE) 50 MG tablet TAKE 1 TABLET BY MOUTH EVERY MORNING  . verapamil (CALAN-SR) 120 mg, Oral, Daily at bedtime  . Zinc 100 mg, Oral, Daily   Radiology:   No results found.  Cardiac Studies:   Lexiscan Myoview Stress Test 02/23/2017: Blood pressure demonstrated a hypertensive response to exercise. There was no ST segment deviation noted during stress. The study is normal. This is a low risk study. Nuclear stress EF: 85%  Echocardiogram 09/25/2018:   Left ventricle: The cavity size was normal. Wall thickness was  increased in a pattern of mild LVH. Systolic function was normal. The estimated ejection fraction was in the range of 60% to 65%.   Wall motion was normal; there were no regional wall motion  abnormalities. Doppler parameters are consistent with abnormal   left ventricular relaxation (grade 1 diastolic dysfunction). - Aortic valve: Mildly calcified annulus. Trileaflet. - Mitral valve: Mildly calcified annulus. There was trivial   regurgitation. - Right atrium: Central venous pressure (est): 3 mm Hg. - Atrial septum: There was increased thickness of the septum,   consistent with lipomatous hypertrophy. - Tricuspid valve: There was trivial regurgitation. - Pulmonary arteries: Systolic pressure could not be accurately    estimated. - Pericardium, extracardiac: A prominent pericardial fat pad was   present. Cannot exclude small to moderate sized, organized   circumferential pericardial effusion. Could consider CT imaging.  Carotid artery duplex 01/16/2019:  Bilateral 1- 39% stenosis.  Antegrade bilateral vertebral flow.  EKG 04/26/2020: Normal sinus rhythm at 77bpm and normal axis. Without evidence of ischemia. Isolated T wave inversion in aVL. No significant change compared to 01/15/2020.   Assessment     ICD-10-CM   1. Chest pain on breathing  R07.1 EKG 12-Lead  2. Left leg pain  M79.605   3. SOB (shortness of breath)  R06.02   4. Sciatica of left side  M54.32     There are no discontinued medications.   Recommendations:   [name] [name]  is a  59 y.o. Caucasian female with amxiety, chronic diastolic heart failure, normal coronary arteries by angiography in 2005 and a normal stress echocardiogram in 2014, also negative nuclear stress test in 2018 and has hyperdynamic left ventricle with grade 1 diastolic dysfunction by echocardiogram in January 2020.  Past medical history significant for hypertension, diabetes mellitus and hyperlipidemia.  Today she presents with leg pain likely due to sciatica. Suspect symptoms of chest pain, shortness of breath, and near syncope to be secondary to the severe leg pain with movement. Unlikely cardiac origin for her symptoms as exam is reassuring and symptoms were atypical and have resolved. Recommend patient follow up with PCP or consider seeing an orthopedic provider for sciatica, as this is the likely root cause of her symptoms.    Instructed patient to let the office know if she experiences left leg swelling, erythema, warmth to touch, or if the pain worsens or becomes constant. Do not suspect this to be likely but will consider left leg ultrasound if patient condition changes.   Will obtain BMP and BNP and follow up in 3 months for heart failure and hypertension.    Patient was seen in collaboration with Dr. Einar Gip. He also reviewed patient's chart and examined the patient. Dr. Einar Gip is in agreement of the plan.    Adrian Prows, PA-C 04/27/2020, 6:36 AM Office: [phone]

## 2020-04-27 ENCOUNTER — Other Ambulatory Visit: Payer: Self-pay | Admitting: Cardiology

## 2020-04-27 DIAGNOSIS — I5032 Chronic diastolic (congestive) heart failure: Secondary | ICD-10-CM

## 2020-04-27 DIAGNOSIS — I10 Essential (primary) hypertension: Secondary | ICD-10-CM

## 2020-04-27 LAB — BASIC METABOLIC PANEL
BUN/Creatinine Ratio: 34 — ABNORMAL HIGH (ref 12–28)
BUN: 25 mg/dL (ref 8–27)
CO2: 22 mmol/L (ref 20–29)
Calcium: 10.1 mg/dL (ref 8.7–10.3)
Chloride: 103 mmol/L (ref 96–106)
Creatinine, Ser: 0.74 mg/dL (ref 0.57–1.00)
GFR calc Af Amer: 102 mL/min/{1.73_m2} (ref >59)
GFR calc non Af Amer: 88 mL/min/{1.73_m2} (ref >59)
Glucose: 158 mg/dL — ABNORMAL HIGH (ref 65–99)
Potassium: 3.8 mmol/L (ref 3.5–5.2)
Sodium: 139 mmol/L (ref 134–144)

## 2020-04-27 LAB — BRAIN NATRIURETIC PEPTIDE: BNP: 8.3 pg/mL (ref 0.0–100.0)

## 2020-04-28 DIAGNOSIS — M5432 Sciatica, left side: Secondary | ICD-10-CM | POA: Diagnosis not present

## 2020-04-28 DIAGNOSIS — I10 Essential (primary) hypertension: Secondary | ICD-10-CM | POA: Diagnosis not present

## 2020-04-28 DIAGNOSIS — M545 Low back pain: Secondary | ICD-10-CM | POA: Diagnosis not present

## 2020-04-28 DIAGNOSIS — Z299 Encounter for prophylactic measures, unspecified: Secondary | ICD-10-CM | POA: Diagnosis not present

## 2020-04-28 DIAGNOSIS — M79606 Pain in leg, unspecified: Secondary | ICD-10-CM | POA: Diagnosis not present

## 2020-04-28 DIAGNOSIS — M25552 Pain in left hip: Secondary | ICD-10-CM | POA: Diagnosis not present

## 2020-04-29 DIAGNOSIS — F410 Panic disorder [episodic paroxysmal anxiety] without agoraphobia: Secondary | ICD-10-CM | POA: Diagnosis not present

## 2020-04-29 DIAGNOSIS — R69 Illness, unspecified: Secondary | ICD-10-CM | POA: Diagnosis not present

## 2020-04-29 DIAGNOSIS — F4312 Post-traumatic stress disorder, chronic: Secondary | ICD-10-CM | POA: Diagnosis not present

## 2020-04-30 NOTE — Telephone Encounter (Signed)
From patient.

## 2020-05-03 NOTE — Telephone Encounter (Signed)
From pt

## 2020-05-06 DIAGNOSIS — M7632 Iliotibial band syndrome, left leg: Secondary | ICD-10-CM | POA: Diagnosis not present

## 2020-05-20 ENCOUNTER — Other Ambulatory Visit: Payer: Self-pay | Admitting: Cardiology

## 2020-05-20 DIAGNOSIS — I10 Essential (primary) hypertension: Secondary | ICD-10-CM | POA: Diagnosis not present

## 2020-05-20 DIAGNOSIS — I509 Heart failure, unspecified: Secondary | ICD-10-CM | POA: Diagnosis not present

## 2020-05-20 DIAGNOSIS — R69 Illness, unspecified: Secondary | ICD-10-CM | POA: Diagnosis not present

## 2020-05-20 DIAGNOSIS — G4730 Sleep apnea, unspecified: Secondary | ICD-10-CM | POA: Diagnosis not present

## 2020-05-20 DIAGNOSIS — I5032 Chronic diastolic (congestive) heart failure: Secondary | ICD-10-CM

## 2020-05-20 DIAGNOSIS — E1165 Type 2 diabetes mellitus with hyperglycemia: Secondary | ICD-10-CM | POA: Diagnosis not present

## 2020-05-20 DIAGNOSIS — Z299 Encounter for prophylactic measures, unspecified: Secondary | ICD-10-CM | POA: Diagnosis not present

## 2020-05-30 ENCOUNTER — Other Ambulatory Visit: Payer: Self-pay | Admitting: Cardiology

## 2020-05-30 DIAGNOSIS — I5032 Chronic diastolic (congestive) heart failure: Secondary | ICD-10-CM

## 2020-05-30 DIAGNOSIS — I10 Essential (primary) hypertension: Secondary | ICD-10-CM

## 2020-06-03 DIAGNOSIS — I10 Essential (primary) hypertension: Secondary | ICD-10-CM | POA: Diagnosis not present

## 2020-06-03 DIAGNOSIS — E119 Type 2 diabetes mellitus without complications: Secondary | ICD-10-CM | POA: Diagnosis not present

## 2020-06-03 DIAGNOSIS — Z79899 Other long term (current) drug therapy: Secondary | ICD-10-CM | POA: Diagnosis not present

## 2020-06-03 DIAGNOSIS — E669 Obesity, unspecified: Secondary | ICD-10-CM | POA: Diagnosis not present

## 2020-06-29 ENCOUNTER — Telehealth: Payer: Self-pay

## 2020-06-29 NOTE — Telephone Encounter (Signed)
Error

## 2020-06-30 ENCOUNTER — Encounter: Payer: Self-pay | Admitting: Cardiology

## 2020-06-30 ENCOUNTER — Ambulatory Visit: Payer: 59 | Admitting: Cardiology

## 2020-06-30 ENCOUNTER — Other Ambulatory Visit: Payer: Self-pay

## 2020-06-30 VITALS — BP 142/68 | HR 76 | Resp 15 | Ht 61.0 in | Wt 162.0 lb

## 2020-06-30 DIAGNOSIS — I5032 Chronic diastolic (congestive) heart failure: Secondary | ICD-10-CM

## 2020-06-30 DIAGNOSIS — I10 Essential (primary) hypertension: Secondary | ICD-10-CM

## 2020-06-30 DIAGNOSIS — R0602 Shortness of breath: Secondary | ICD-10-CM

## 2020-06-30 MED ORDER — LABETALOL HCL 100 MG PO TABS: 200.0000 mg | ORAL_TABLET | Freq: Two times a day (BID) | ORAL | 2 refills | Status: DC

## 2020-06-30 NOTE — Progress Notes (Signed)
Primary Physician/Referring:  Glenda Chroman, MD  Patient ID: [name] [name], female    DOB: [birth year], 59 y.o.   MRN: [id number]  Chief Complaint  Patient presents with  . Follow-up    3 month  . Congestive Heart Failure  . Hypertension   HPI:    [name] [name]  is a 59 y.o. Caucasian female with chronic anxiety, chronic diastolic heart failure, normal coronary arteries by angiography in 2005 and a normal stress echocardiogram in 2014, also negative nuclear stress test in 2018 and has hyperdynamic left ventricle with grade 1 diastolic dysfunction by echocardiogram in January 2020.  Past medical history significant for hypertension, diabetes mellitus and hyperlipidemia.  This is a 24-monthoffice visit of diastolic heart failure and hypertension follow-up.  States that after she was evaluated on her last office visit, left leg pain is resolved and it was felt to be due to muscle spasm.  States that dyspnea is remained stable and improved.  She is under stress as her husband is not well.  She has not had any leg edema, no PND or orthopnea.  Past Medical History:  Diagnosis Date  . Abnormal EKG   . Allergy   . Anxiety    PTSD  . CH[place] ([place]estive heart failure) ([facility]   . Depression   . Depression   . Diabetes mellitus without complication ([facility]   . Dyspnea   . GERD (gastroesophageal reflux disease)   . Hyperlipidemia   . Hypertension    Unspecified  . IBS (irritable bowel syndrome)   . Kidney stone   . Panic disorder 03/20/2019  . Seizures ([place]    Reports last seizure was 30 + years ago   . Sleep apnea    no CPAP worn  . Stroke ([facility]    Past Surgical History:  Procedure Laterality Date  . HYSTEROSCOPY  2001   submucous myomectomy  . TOTAL ABDOMINAL HYSTERECTOMY  2002   TAH BSO  pathology showed leiomyoma, adenomyosis, simple and complex hyperplasia without atypia  . TUMOR REMOVAL     from left ear   Family History  Problem Relation Age of Onset  . Cancer Father         Prostate  . High blood pressure Father   . Coronary artery disease Paternal Grandfather   . Uterine cancer Mother   . Irritable bowel syndrome Mother   . Stroke Mother   . Osteoporosis Mother   . Heart disease Maternal Grandmother   . Heart attack Brother 470 . Cancer Brother        Prostate  . Colitis Neg Hx   . Esophageal cancer Neg Hx   . Stomach cancer Neg Hx   . Rectal cancer Neg Hx     Social History   Tobacco Use  . Smoking status: Never Smoker  . Smokeless tobacco: Never Used  Substance Use Topics  . Alcohol use: No    Alcohol/week: 0.0 standard drinks   Marital Status: Married  ROS  Review of Systems  Cardiovascular: Negative for chest pain, leg swelling, orthopnea, palpitations and paroxysmal nocturnal dyspnea.  Respiratory: Positive for shortness of breath. Negative for hemoptysis.   Musculoskeletal: Positive for joint pain. Negative for joint swelling and muscle weakness.  Gastrointestinal: Negative for melena.   Objective  Blood pressure (!) 142/68, pulse 76, resp. rate 15, height _0  (1.549 m), weight 162 lb (73.5 kg), SpO2 97 %.  Vitals with BMI 06/30/2020 04/26/2020 03/24/2020  Height 5'  1" _0  _1   Weight 162 lbs 161 lbs 164 lbs 10 oz  BMI 30.63 09.98 33.82  Systolic 505 397 673  Diastolic 68 65 76  Pulse 76 75 82    Physical Exam Constitutional:      General: She is not in acute distress.    Comments: Short stature, mildly obese and in no acute distress.   HENT:     Head: Atraumatic.  Cardiovascular:     Rate and Rhythm: Normal rate and regular rhythm.     Pulses: Intact distal pulses.          Radial pulses are 2+ on the right side and 2+ on the left side.       Femoral pulses are 2+ on the right side and 2+ on the left side.      Popliteal pulses are 1+ on the right side and 1+ on the left side.       Dorsalis pedis pulses are 2+ on the right side and 2+ on the left side.       Posterior tibial pulses are 2+ on the right side and 2+ on  the left side.     Heart sounds: No murmur heard.  No gallop.      Comments: No leg edema. No JVD.   Pulmonary:     Effort: Pulmonary effort is normal. No accessory muscle usage or respiratory distress.     Breath sounds: Normal breath sounds. No wheezing, rhonchi or rales.  Abdominal:     Palpations: Abdomen is soft.  Musculoskeletal:        General: Tenderness (posterior left leg pain from hip to ankle. Pain exacerabted by straight leg raise.) present. No swelling.     Right lower leg: No edema.     Left lower leg: No edema.  Skin:    Findings: No erythema, lesion or rash.    Laboratory examination:   Recent Labs    02/10/20 0422 02/23/20 1141 04/26/20 1324  NA 138 141 139  K 4.7 4.9 3.8  CL 107 103 103  CO2 _2 GLUCOSE 119* 156* 158*  BUN 25* 20 25  CREATININE 0.88 0.88 0.74  CALCIUM 9.1 10.3 10.1  GFRNONAA >60 72 88  GFRAA >60 83 102   CrCl cannot be calculated (Patient's most recent lab result is older than the maximum 21 days allowed.).  CMP Latest Ref Rng & Units 04/26/2020 02/23/2020 02/10/2020  Glucose 65 - 99 mg/dL 158(H) 156(H) 119(H)  BUN 8 - 27 mg/dL 25 20 25(H)  Creatinine 0.57 - 1.00 mg/dL 0.74 0.88 0.88  Sodium 134 - 144 mmol/L 139 141 138  Potassium 3.5 - 5.2 mmol/L 3.8 4.9 4.7  Chloride 96 - 106 mmol/L 103 103 107  CO2 20 - 29 mmol/L _3 Calcium 8.7 - 10.3 mg/dL 10.1 10.3 9.1  Total Protein 6.5 - 8.1 g/dL - - -  Total Bilirubin 0.3 - 1.2 mg/dL - - -  Alkaline Phos 38 - 126 U/L - - -  AST 15 - 41 U/L - - -  ALT 0 - 44 U/L - - -   CBC Latest Ref Rng & Units 02/08/2020 08/04/2019 11/01/2018  WBC 4.0 - 10.5 K/uL 6.0 5.5 7.1  Hemoglobin 12.0 - 15.0 g/dL 12.2 12.5 12.3  Hematocrit 36 - 46 % 37.0 37.9 40.0  Platelets 150 - 400 K/uL 175 169 189   Lipid Panel  No results found for: CHOL, TRIG,  HDL, CHOLHDL, VLDL, LDLCALC, LDLDIRECT HEMOGLOBIN A1C Lab Results  Component Value Date   HGBA1C 7.9 (H) 02/08/2020   MPG 180.03 02/08/2020   BNP  (last 3 results) Recent Labs    02/08/20 0034 04/26/20 1324  BNP 7.0 8.3   External labs:  08/04/2019: eGFR 60. WBC 5.5, RBC 4.2, H/H 37.9/12.5, PLT 169.  TSH 2.596 09/25/2018  Labs 06/12/2019: Serum glucose 212 mg, BUN 15, creatinine 0.77, eGFR >19m potassium 4.1, CMP otherwise normal. Total cholesterol 182, triglycerides 426, HDL 28, LDL 85. CBC normal, TSH normal.  10/29/2018: AST 24.5, ALT 29, BUN 12, Creatinine 0.67, Sodium 137, Potassium 3.1, eGFR If NonAfricn Am >60  04/05/2018: Lipid profile: Cholesterol 196, HDL 29, Triglycerides 382, VLDL 76, LDL 90 Hemoglobin A1C 7.9.  TSH 2.28  Medications and allergies   Allergies  Allergen Reactions  . Codeine Rash  . Doxycycline Rash  . Erythromycin Rash     Current Outpatient Medications  Medication Instructions  . ALPRAZolam (XANAX) 1 mg, Oral, 3 times daily PRN  . ALPRAZolam (XANAX) 0.5 mg, Oral, Daily at bedtime  . aspirin 81 mg, Oral, Daily at bedtime  . buPROPion (WELLBUTRIN SR) 150 mg, Oral, Daily  . buPROPion (WELLBUTRIN XL) 150 mg, Oral,  Every morning - 10a  . Cholecalciferol (VITAMIN D) 125 MCG (5000 UT) CAPS Oral, Daily  . citalopram (CELEXA) 40 mg, Oral, Daily  . cyclobenzaprine (FLEXERIL) 10 mg, Oral, 3 times daily  . furosemide (LASIX) 20 MG tablet Twice daily as needed if weight >3 Lbs  . gabapentin (NEURONTIN) 400 mg, Oral, 2 times daily  . glimepiride (AMARYL) 4 mg, Oral, Daily  . labetalol (NORMODYNE) 200 mg, Oral, 2 times daily  . omeprazole (PRILOSEC) 20 mg, Oral, Daily at bedtime  . potassium chloride SA (KLOR-CON) 20 MEQ tablet 20 mEq, Oral, Daily PRN, Only if furosemide taken.  . pravastatin (PRAVACHOL) 20 MG tablet TAKE 1 TABLET BY MOUTH EVERY DAY  . Semaglutide,0.25 or 0.5MG/DOS, (OZEMPIC, 0.25 OR 0.5 MG/DOSE,) 2 MG/1.5ML SOPN 2.5 mLs, Subcutaneous, Every Wed  . spironolactone (ALDACTONE) 50 MG tablet TAKE 1 TABLET BY MOUTH EVERY DAY IN THE MORNING  . verapamil (CALAN-SR) 120 mg, Oral, Daily  at bedtime  . Zinc 100 mg, Oral, Daily   Radiology:   No results found.  Cardiac Studies:   Lexiscan Myoview Stress Test 02/23/2017: Blood pressure demonstrated a hypertensive response to exercise. There was no ST segment deviation noted during stress. The study is normal. This is a low risk study. Nuclear stress EF: 85%  Echocardiogram 09/25/2018:   Left ventricle: The cavity size was normal. Wall thickness was  increased in a pattern of mild LVH. Systolic function was normal. The estimated ejection fraction was in the range of 60% to 65%.   Wall motion was normal; there were no regional wall motion  abnormalities. Doppler parameters are consistent with abnormal   left ventricular relaxation (grade 1 diastolic dysfunction). - Aortic valve: Mildly calcified annulus. Trileaflet. - Mitral valve: Mildly calcified annulus. There was trivial   regurgitation. - Right atrium: Central venous pressure (est): 3 mm Hg. - Atrial septum: There was increased thickness of the septum,   consistent with lipomatous hypertrophy. - Tricuspid valve: There was trivial regurgitation. - Pulmonary arteries: Systolic pressure could not be accurately   estimated. - Pericardium, extracardiac: A prominent pericardial fat pad was   present. Cannot exclude small to moderate sized, organized   circumferential pericardial effusion. Could consider CT imaging.  Carotid artery duplex 01/16/2019:  Bilateral 1- 39% stenosis.  Antegrade bilateral vertebral flow.  EKG 04/26/2020: Normal sinus rhythm at 77bpm and normal axis. Without evidence of ischemia. Isolated T wave inversion in aVL. No significant change compared to 01/15/2020.   Assessment     ICD-10-CM   1. SOB (shortness of breath)  R06.02   2. Chronic diastolic heart failure (HCC)  I50.32   3. Essential hypertension  I10 labetalol (NORMODYNE) 100 MG tablet    Medications Discontinued During This Encounter  Medication Reason  . lisinopril (ZESTRIL) 5 MG  tablet Change in therapy  . metoprolol succinate (TOPROL-XL) 25 MG 24 hr tablet Change in therapy     Recommendations:   [name] [name]  is a  59 y.o. Caucasian female with anxiety, chronic diastolic heart failure, normal coronary arteries by angiography in 2005 and a normal stress echocardiogram in 2014, also negative nuclear stress test in 2018 and has hyperdynamic left ventricle with grade 1 diastolic dysfunction by echocardiogram in January 2020.  Past medical history significant for hypertension, diabetes mellitus and hyperlipidemia.  This is a 65-monthoffice visit for follow-up of left leg pain and also hypertension and dyspnea.  Dyspnea has remained stable, BNP reviewed, no clinical evidence of heart failure.  Blood pressure is slightly elevated today, she is under extreme stress due to health issues of her husband.  I will discontinue metoprolol succinate and switch her to labetalol 1 mg p.o. twice daily.  She has an appointment to see her PCP in couple weeks and the dosage can be increased if necessary.  Patient is also on low-dose of verapamil SR and she is only 59years of age and I do not think that this should be a contraindication to using 2 - chronotropic agents.  I will see her back in 6 months for follow-up.  Left leg pain has now resolved and it was felt to be due to muscle spasm by orthopedic.   JAdrian Prows MD, [facility]10/20/2021, 10:58 AM Office: [phone]: [phone]

## 2020-06-30 NOTE — Patient Instructions (Signed)
Discontinue Metoprolol Succinate 25 mg and start Labetalol 100 mg twice daily.  I would like to have your blood pressure to be <847/30 mmHg, certainly could increase labetalol to 200 mg twice daily after you see Dr. Woody Seller if blood pressure is not controlled.

## 2020-07-07 ENCOUNTER — Other Ambulatory Visit: Payer: Self-pay | Admitting: Cardiology

## 2020-07-07 DIAGNOSIS — I10 Essential (primary) hypertension: Secondary | ICD-10-CM

## 2020-07-12 ENCOUNTER — Other Ambulatory Visit: Payer: Self-pay | Admitting: Cardiology

## 2020-07-12 DIAGNOSIS — I10 Essential (primary) hypertension: Secondary | ICD-10-CM

## 2020-07-19 ENCOUNTER — Ambulatory Visit: Payer: 59 | Admitting: Cardiology

## 2020-07-27 ENCOUNTER — Other Ambulatory Visit: Payer: Self-pay | Admitting: Obstetrics and Gynecology

## 2020-07-27 DIAGNOSIS — Z1231 Encounter for screening mammogram for malignant neoplasm of breast: Secondary | ICD-10-CM

## 2020-07-28 ENCOUNTER — Other Ambulatory Visit: Payer: Self-pay | Admitting: Cardiology

## 2020-07-28 ENCOUNTER — Telehealth: Payer: Self-pay

## 2020-07-28 ENCOUNTER — Other Ambulatory Visit: Payer: Self-pay

## 2020-07-28 DIAGNOSIS — I10 Essential (primary) hypertension: Secondary | ICD-10-CM

## 2020-07-28 NOTE — Telephone Encounter (Signed)
Received a call from patient regarding Metoprolol. Medication was discontinued during her visit in October and replaced with Labetalol, however- patient stated she was unable to take Labetalol and spoke to you personally about it. Per patient, she was advised to go back on the Metoprolol. Patient is requesting a refill for Metoprolol, but medication is no longer on her list. Can I add this back to her list to refill it? Please advise. Thanks!

## 2020-07-29 ENCOUNTER — Other Ambulatory Visit: Payer: Self-pay | Admitting: Cardiology

## 2020-07-29 DIAGNOSIS — I10 Essential (primary) hypertension: Secondary | ICD-10-CM

## 2020-07-29 MED ORDER — METOPROLOL SUCCINATE ER 50 MG PO TB24: 50.0000 mg | ORAL_TABLET | Freq: Every day | ORAL | 3 refills | Status: DC

## 2020-07-29 NOTE — Telephone Encounter (Signed)
I have taken care of this

## 2020-09-07 ENCOUNTER — Ambulatory Visit
Admission: RE | Admit: 2020-09-07 | Discharge: 2020-09-07 | Disposition: A | Discharge status: Home / Self Care | Payer: Medicare HMO | Source: Ambulatory Visit | Attending: Obstetrics and Gynecology | Admitting: Obstetrics and Gynecology

## 2020-09-07 ENCOUNTER — Other Ambulatory Visit: Payer: Self-pay

## 2020-09-07 DIAGNOSIS — Z1231 Encounter for screening mammogram for malignant neoplasm of breast: Secondary | ICD-10-CM

## 2020-09-15 DIAGNOSIS — R69 Illness, unspecified: Secondary | ICD-10-CM | POA: Diagnosis not present

## 2020-09-21 ENCOUNTER — Ambulatory Visit (INDEPENDENT_AMBULATORY_CARE_PROVIDER_SITE_OTHER): Payer: 59 | Admitting: Obstetrics and Gynecology

## 2020-09-21 ENCOUNTER — Encounter: Payer: Self-pay | Admitting: Obstetrics and Gynecology

## 2020-09-21 ENCOUNTER — Other Ambulatory Visit: Payer: Self-pay

## 2020-09-21 VITALS — BP 120/78 | Ht 61.0 in | Wt 162.0 lb

## 2020-09-21 DIAGNOSIS — E559 Vitamin D deficiency, unspecified: Secondary | ICD-10-CM

## 2020-09-21 DIAGNOSIS — Z01419 Encounter for gynecological examination (general) (routine) without abnormal findings: Secondary | ICD-10-CM | POA: Diagnosis not present

## 2020-09-21 DIAGNOSIS — Z13820 Encounter for screening for osteoporosis: Secondary | ICD-10-CM

## 2020-09-21 NOTE — Progress Notes (Signed)
[name] [name] [birth year] [id number]  SUBJECTIVE:  60 y.o. G0P0000 female for annual breast and pelvic exam.  Last seen in this office 01/2017. She has no gynecologic concerns.  Current Outpatient Medications  Medication Sig Dispense Refill  . ALPRAZolam (XANAX) 0.5 MG tablet Take 1 tablet (0.5 mg total) by mouth at bedtime.  0  . ALPRAZolam (XANAX) 1 MG tablet Take 1 mg by mouth 3 (three) times daily as needed.     Marland Kitchen aspirin 81 MG tablet Take 81 mg by mouth at bedtime.     Marland Kitchen buPROPion (WELLBUTRIN XL) 150 MG 24 hr tablet Take 150 mg by mouth every morning.    . Cholecalciferol (VITAMIN D) 125 MCG (5000 UT) CAPS Take by mouth daily.    . citalopram (CELEXA) 40 MG tablet Take 40 mg by mouth daily.    . cyclobenzaprine (FLEXERIL) 10 MG tablet Take 10 mg by mouth 3 (three) times daily.    . furosemide (LASIX) 20 MG tablet Twice daily as needed if weight >3 Lbs (Patient taking differently: 40 mg. Twice daily as needed if weight >3 Lbs) 180 tablet 0  . gabapentin (NEURONTIN) 400 MG capsule Take 400 mg by mouth 2 (two) times daily.    Marland Kitchen glimepiride (AMARYL) 4 MG tablet Take 4 mg by mouth daily.    . metoprolol succinate (TOPROL-XL) 50 MG 24 hr tablet Take 1 tablet (50 mg total) by mouth daily. Take with a meal. 90 tablet 3  . omeprazole (PRILOSEC) 20 MG capsule Take 20 mg by mouth at bedtime.   9  . potassium chloride SA (KLOR-CON) 20 MEQ tablet Take 1 tablet (20 mEq total) by mouth daily as needed (With Furosemide). Only if furosemide taken. 180 tablet 1  . pravastatin (PRAVACHOL) 20 MG tablet TAKE 1 TABLET BY MOUTH EVERY DAY 90 tablet 0  . Semaglutide,0.25 or 0.5MG /DOS, 2 MG/1.5ML SOPN Inject 2.5 mLs into the skin every Wednesday.     Marland Kitchen spironolactone (ALDACTONE) 50 MG tablet TAKE 1 TABLET BY MOUTH EVERY DAY IN THE MORNING 90 tablet 1  . verapamil (CALAN-SR) 120 MG CR tablet Take 120 mg by mouth at bedtime.   11  . Zinc 100 MG TABS Take 100 mg by mouth daily.     No current  facility-administered medications for this visit.   Allergies: Codeine, Doxycycline, and Erythromycin  No LMP recorded. Patient has had a hysterectomy.  Past medical history,surgical history, problem list, medications, allergies, family history and social history were all reviewed and documented as reviewed in the EPIC chart.  ROS: Pertinent positives and negatives as reviewed in HPI    OBJECTIVE:  BP 120/78 (BP Location: Right Arm, Patient Position: Sitting, Cuff Size: Normal)   Ht 5\' 1"  (1.549 m)   Wt 162 lb (73.5 kg)   BMI 30.61 kg/m  The patient appears well, alert, oriented, in no distress.  BREAST EXAM: breasts appear normal, no suspicious masses, no skin or nipple changes or axillary nodes  PELVIC EXAM: VULVA: normal appearing vulva with atrophic change, no masses, tenderness or lesions, VAGINA: normal appearing vagina with atrophic change, normal color and discharge, no lesions, CERVIX: Surgically absent, UTERUS: Surgically absent, vaginal cuff normal, ADNEXA: normal adnexa in size, nontender and no masses  Chaperone: Wandra Scot Bonham present during the examination  ASSESSMENT:  60 y.o. G0P0000 here for annual gynecologic exam  PLAN:   1. Postmenopausal.  Prior TAH/BSO 2002 for menorrhagia.  Final pathology per previous notes indicated no myoma, adenomyosis,  hyperplasia without atypia.  No significant hot flashes or night sweats.  No vaginal bleeding. 2. Pap smear 11/2015.  No history of abnormal Pap smears.  We discussed current screening guidelines and with prior hysterectomy she does not need to continue with Pap smears and she is comfortable with discontinuing screening. 3. Mammogram 08/2020.  Normal breast exam today.  Continue with annual mammograms. 4. Colonoscopy 04/2018.  She will follow up at the recommended interval in 2024 per GI. 5. DEXA 2015 normal.  Next DEXA recommended now so she plans to schedule this. 6. Health maintenance.  Check vitamin D today due to  history of deficiency.  No other labs today as she normally has these completed with her primary care doctor.    Return annually or sooner, prn.  Joseph Pierini MD 09/21/20

## 2020-09-22 DIAGNOSIS — F4312 Post-traumatic stress disorder, chronic: Secondary | ICD-10-CM | POA: Diagnosis not present

## 2020-09-22 DIAGNOSIS — F410 Panic disorder [episodic paroxysmal anxiety] without agoraphobia: Secondary | ICD-10-CM | POA: Diagnosis not present

## 2020-09-22 DIAGNOSIS — R69 Illness, unspecified: Secondary | ICD-10-CM | POA: Diagnosis not present

## 2020-10-06 DIAGNOSIS — I110 Hypertensive heart disease with heart failure: Secondary | ICD-10-CM | POA: Diagnosis not present

## 2020-10-06 DIAGNOSIS — R69 Illness, unspecified: Secondary | ICD-10-CM | POA: Diagnosis not present

## 2020-10-06 DIAGNOSIS — Z791 Long term (current) use of non-steroidal anti-inflammatories (NSAID): Secondary | ICD-10-CM | POA: Diagnosis not present

## 2020-10-06 DIAGNOSIS — E1165 Type 2 diabetes mellitus with hyperglycemia: Secondary | ICD-10-CM | POA: Diagnosis not present

## 2020-10-06 DIAGNOSIS — E1142 Type 2 diabetes mellitus with diabetic polyneuropathy: Secondary | ICD-10-CM | POA: Diagnosis not present

## 2020-10-06 DIAGNOSIS — Z7982 Long term (current) use of aspirin: Secondary | ICD-10-CM | POA: Diagnosis not present

## 2020-10-06 DIAGNOSIS — Z79899 Other long term (current) drug therapy: Secondary | ICD-10-CM | POA: Diagnosis not present

## 2020-10-06 DIAGNOSIS — K219 Gastro-esophageal reflux disease without esophagitis: Secondary | ICD-10-CM | POA: Diagnosis not present

## 2020-10-06 DIAGNOSIS — Z7984 Long term (current) use of oral hypoglycemic drugs: Secondary | ICD-10-CM | POA: Diagnosis not present

## 2020-10-06 DIAGNOSIS — I69398 Other sequelae of cerebral infarction: Secondary | ICD-10-CM | POA: Diagnosis not present

## 2020-10-06 DIAGNOSIS — I509 Heart failure, unspecified: Secondary | ICD-10-CM | POA: Diagnosis not present

## 2020-10-22 DIAGNOSIS — M25561 Pain in right knee: Secondary | ICD-10-CM | POA: Diagnosis not present

## 2020-10-22 DIAGNOSIS — M25562 Pain in left knee: Secondary | ICD-10-CM | POA: Diagnosis not present

## 2020-11-17 DIAGNOSIS — R69 Illness, unspecified: Secondary | ICD-10-CM | POA: Diagnosis not present

## 2020-12-01 DIAGNOSIS — I10 Essential (primary) hypertension: Secondary | ICD-10-CM | POA: Diagnosis not present

## 2020-12-10 DIAGNOSIS — M170 Bilateral primary osteoarthritis of knee: Secondary | ICD-10-CM | POA: Diagnosis not present

## 2020-12-15 DIAGNOSIS — R69 Illness, unspecified: Secondary | ICD-10-CM | POA: Diagnosis not present

## 2020-12-19 ENCOUNTER — Other Ambulatory Visit: Payer: Self-pay | Admitting: Cardiology

## 2020-12-19 DIAGNOSIS — I5032 Chronic diastolic (congestive) heart failure: Secondary | ICD-10-CM

## 2020-12-19 DIAGNOSIS — I10 Essential (primary) hypertension: Secondary | ICD-10-CM

## 2020-12-30 ENCOUNTER — Ambulatory Visit: Payer: 59 | Admitting: Cardiology

## 2021-01-08 DIAGNOSIS — I10 Essential (primary) hypertension: Secondary | ICD-10-CM | POA: Diagnosis not present

## 2021-01-08 DIAGNOSIS — R69 Illness, unspecified: Secondary | ICD-10-CM | POA: Diagnosis not present

## 2021-01-13 ENCOUNTER — Encounter: Payer: Self-pay | Admitting: Cardiology

## 2021-01-13 ENCOUNTER — Other Ambulatory Visit: Payer: Self-pay

## 2021-01-13 ENCOUNTER — Ambulatory Visit: Payer: Medicare HMO | Admitting: Cardiology

## 2021-01-13 VITALS — BP 135/76 | HR 76 | Temp 97.6°F | Resp 17 | Ht 61.0 in | Wt 161.8 lb

## 2021-01-13 DIAGNOSIS — I10 Essential (primary) hypertension: Secondary | ICD-10-CM | POA: Diagnosis not present

## 2021-01-13 DIAGNOSIS — I5032 Chronic diastolic (congestive) heart failure: Secondary | ICD-10-CM

## 2021-01-13 DIAGNOSIS — K760 Fatty (change of) liver, not elsewhere classified: Secondary | ICD-10-CM

## 2021-01-13 NOTE — Progress Notes (Signed)
Primary Physician/Referring:  Glenda Chroman, MD  Patient ID: [name] [name], female    DOB: 02-24-1961, 60 y.o.   MRN: [id number]  Chief Complaint  Patient presents with  . Follow-up  . Hypertension    6 month   HPI:    [name] [name]  is a 60 y.o. Caucasian female with chronic anxiety, chronic diastolic heart failure, normal coronary arteries by angiography in 2005 and a normal stress echocardiogram in 2014, also negative nuclear stress test in 2018 and has hyperdynamic left ventricle with grade 1 diastolic dysfunction by echocardiogram in January 2020.  Past medical history significant for hypertension, diabetes mellitus and hyperlipidemia.  This is a 39-monthoffice visit of diastolic heart failure and hypertension follow-up.  She is presently doing well and has not had any recent hospitalization.  She has used furosemide only twice in the past 6 months.  She admits to not following the diet in the last few months and hemoglobin A1c has again increased.  However she appears motivated to getting back to exercise and diet.  Degenerative joint disease especially bilateral knee still bothers her.     Past Medical History:  Diagnosis Date  . Abnormal EKG   . Allergy   . Anxiety    PTSD  . CHF (congestive heart failure) ([place]   . Depression   . Depression   . Diabetes mellitus without complication ([facility]   . Dyspnea   . GERD (gastroesophageal reflux disease)   . Hyperlipidemia   . Hypertension    Unspecified  . IBS (irritable bowel syndrome)   . Kidney stone   . Panic disorder 03/20/2019  . Seizures ([place]    Reports last seizure was 30 + years ago   . Sleep apnea    no CPAP worn  . Stroke ([facility]    Past Surgical History:  Procedure Laterality Date  . HYSTEROSCOPY  2001   submucous myomectomy  . TOTAL ABDOMINAL HYSTERECTOMY  2002   TAH BSO  pathology showed leiomyoma, adenomyosis, simple and complex hyperplasia without atypia  . TUMOR REMOVAL     from left ear   Family  History  Problem Relation Age of Onset  . Cancer Father        Prostate  . High blood pressure Father   . Coronary artery disease Paternal Grandfather   . Uterine cancer Mother   . Irritable bowel syndrome Mother   . Stroke Mother   . Osteoporosis Mother   . Heart disease Maternal Grandmother   . Heart attack Brother 427 . Cancer Brother        Prostate  . Colitis Neg Hx   . Esophageal cancer Neg Hx   . Stomach cancer Neg Hx   . Rectal cancer Neg Hx     Social History   Tobacco Use  . Smoking status: Never Smoker  . Smokeless tobacco: Never Used  Substance Use Topics  . Alcohol use: No    Alcohol/week: 0.0 standard drinks   Marital Status: Married  ROS  Review of Systems  Cardiovascular: Negative for chest pain, leg swelling, palpitations and paroxysmal nocturnal dyspnea.  Respiratory: Positive for shortness of breath. Negative for hemoptysis.   Musculoskeletal: Positive for joint pain. Negative for joint swelling and muscle weakness.  Gastrointestinal: Negative for melena.   Objective  Blood pressure 135/76, pulse 76, temperature 97.6 F (36.4 C), temperature source Temporal, resp. rate 17, height 5' 1"  (1.549 m), weight 161 lb 12.8 oz (73.4 kg),  SpO2 98 %.  Vitals with BMI 01/13/2021 09/21/2020 06/30/2020  Height 5' 1"  5' 1"  5' 1"   Weight 161 lbs 13 oz 162 lbs 162 lbs  BMI 30.59 35.45 62.56  Systolic 389 373 428  Diastolic 76 78 68  Pulse 76 - 76    Physical Exam Constitutional:      General: She is not in acute distress.    Comments: Short stature, mildly obese and in no acute distress.   HENT:     Head: Atraumatic.  Cardiovascular:     Rate and Rhythm: Normal rate and regular rhythm.     Pulses: Intact distal pulses.          Radial pulses are 2+ on the right side and 2+ on the left side.       Femoral pulses are 2+ on the right side and 2+ on the left side.      Popliteal pulses are 1+ on the right side and 1+ on the left side.       Dorsalis pedis pulses  are 2+ on the right side and 2+ on the left side.       Posterior tibial pulses are 2+ on the right side and 2+ on the left side.     Heart sounds: No murmur heard. No gallop.      Comments: No leg edema. No JVD.   Pulmonary:     Effort: Pulmonary effort is normal. No accessory muscle usage or respiratory distress.     Breath sounds: Normal breath sounds. No wheezing, rhonchi or rales.  Abdominal:     Palpations: Abdomen is soft.  Musculoskeletal:        General: No swelling or tenderness.     Right lower leg: No edema.     Left lower leg: No edema.  Skin:    Capillary Refill: Capillary refill takes less than 2 seconds.     Findings: No erythema, lesion or rash.  Neurological:     General: No focal deficit present.     Mental Status: She is oriented to person, place, and time.    Laboratory examination:   Recent Labs    02/10/20 0422 02/23/20 1141 04/26/20 1324  NA 138 141 139  K 4.7 4.9 3.8  CL 107 103 103  CO2 22 22 22   GLUCOSE 119* 156* 158*  BUN 25* 20 25  CREATININE 0.88 0.88 0.74  CALCIUM 9.1 10.3 10.1  GFRNONAA >60 72 88  GFRAA >60 83 102   CrCl cannot be calculated (Patient's most recent lab result is older than the maximum 21 days allowed.).  CMP Latest Ref Rng & Units 04/26/2020 02/23/2020 02/10/2020  Glucose 65 - 99 mg/dL 158(H) 156(H) 119(H)  BUN 8 - 27 mg/dL 25 20 25(H)  Creatinine 0.57 - 1.00 mg/dL 0.74 0.88 0.88  Sodium 134 - 144 mmol/L 139 141 138  Potassium 3.5 - 5.2 mmol/L 3.8 4.9 4.7  Chloride 96 - 106 mmol/L 103 103 107  CO2 20 - 29 mmol/L 22 22 22   Calcium 8.7 - 10.3 mg/dL 10.1 10.3 9.1  Total Protein 6.5 - 8.1 g/dL - - -  Total Bilirubin 0.3 - 1.2 mg/dL - - -  Alkaline Phos 38 - 126 U/L - - -  AST 15 - 41 U/L - - -  ALT 0 - 44 U/L - - -   CBC Latest Ref Rng & Units 02/08/2020 08/04/2019 11/01/2018  WBC 4.0 - 10.5 K/uL 6.0 5.5 7.1  Hemoglobin  12.0 - 15.0 g/dL 12.2 12.5 12.3  Hematocrit 36.0 - 46.0 % 37.0 37.9 40.0  Platelets 150 - 400 K/uL  175 169 189   Lipid Panel  No results found for: CHOL, TRIG, HDL, CHOLHDL, VLDL, LDLCALC, LDLDIRECT HEMOGLOBIN A1C Lab Results  Component Value Date   HGBA1C 7.9 (H) 02/08/2020   MPG 180.03 02/08/2020   BNP (last 3 results) Recent Labs    02/08/20 0034 04/26/20 1324  BNP 7.0 8.3   External labs:  08/04/2019: eGFR 60. WBC 5.5, RBC 4.2, H/H 37.9/12.5, PLT 169.  TSH 2.596 09/25/2018  Labs 06/12/2019: Serum glucose 212 mg, BUN 15, creatinine 0.77, eGFR >68m potassium 4.1, CMP otherwise normal. Total cholesterol 182, triglycerides 426, HDL 28, LDL 85. CBC normal, TSH normal.  10/29/2018: AST 24.5, ALT 29, BUN 12, Creatinine 0.67, Sodium 137, Potassium 3.1, eGFR If NonAfricn Am >60  04/05/2018: Lipid profile: Cholesterol 196, HDL 29, Triglycerides 382, VLDL 76, LDL 90 Hemoglobin A1C 7.9.  TSH 2.28  Medications and allergies   Allergies  Allergen Reactions  . Codeine Rash  . Doxycycline Rash  . Erythromycin Rash     Current Outpatient Medications  Medication Instructions  . ALPRAZolam (XANAX) 1 mg, Oral, 3 times daily PRN  . aspirin 81 mg, Oral, Daily at bedtime  . buPROPion (WELLBUTRIN XL) 150 mg, Oral, Every morning  . Choline Fenofibrate (FENOFIBRIC ACID) 135 MG CPDR 1 capsule, Oral, Daily  . citalopram (CELEXA) 40 mg, Oral, Daily  . furosemide (LASIX) 20 MG tablet Twice daily as needed if weight >3 Lbs  . gabapentin (NEURONTIN) 400 mg, Oral, 2 times daily  . glimepiride (AMARYL) 4 mg, Oral, Daily  . metoprolol succinate (TOPROL-XL) 50 mg, Oral, Daily, Take with a meal.  . omeprazole (PRILOSEC) 20 mg, Oral, Daily at bedtime  . potassium chloride SA (KLOR-CON) 20 MEQ tablet 20 mEq, Oral, Daily PRN, Only if furosemide taken.  . Semaglutide,0.25 or 0.5MG/DOS, 2 MG/1.5ML SOPN 2.5 mLs, Subcutaneous, Every Wed  . spironolactone (ALDACTONE) 50 MG tablet TAKE 1 TABLET BY MOUTH EVERY DAY IN THE MORNING  . verapamil (CALAN-SR) 120 mg, Oral, Daily at bedtime   Radiology:    CT abdomen 12/28/2017: 1. A 4 mm left ureteral stone at the pelvic brim causing moderate left-sided hydronephrosis and hydroureter. 2. Colonic diverticulosis without acute diverticulitis. 3. Hepatic steatosis. Slightly lobular liver surface that may reflect morphologic changes of cirrhosis.  Cardiac Studies:   Lexiscan Myoview Stress Test 02/23/2017: Blood pressure demonstrated a hypertensive response to exercise. There was no ST segment deviation noted during stress. The study is normal. This is a low risk study. Nuclear stress EF: 85%  Echocardiogram 09/25/2018:   Left ventricle: The cavity size was normal. Wall thickness was  increased in a pattern of mild LVH. Systolic function was normal. The estimated ejection fraction was in the range of 60% to 65%.   Wall motion was normal; there were no regional wall motion  abnormalities. Doppler parameters are consistent with abnormal   left ventricular relaxation (grade 1 diastolic dysfunction). - Aortic valve: Mildly calcified annulus. Trileaflet. - Mitral valve: Mildly calcified annulus. There was trivial   regurgitation. - Right atrium: Central venous pressure (est): 3 mm Hg. - Atrial septum: There was increased thickness of the septum,   consistent with lipomatous hypertrophy. - Tricuspid valve: There was trivial regurgitation. - Pulmonary arteries: Systolic pressure could not be accurately   estimated. - Pericardium, extracardiac: A prominent pericardial fat pad was   present. Cannot exclude  small to moderate sized, organized   circumferential pericardial effusion. Could consider CT imaging.  Carotid artery duplex 01/16/2019:  Bilateral 1- 39% stenosis.  Antegrade bilateral vertebral flow.  EKG   EKG 01/13/2021: Normal sinus rhythm at rate of 77 bpm, left axis deviation, left intrafascicular block.  Poor R progression, cannot exclude anteroseptal infarct old.  ST-T abnormality in high lateral leads, cannot exclude ischemia.     04/26/2020: Normal sinus rhythm at 77bpm and normal axis. Without evidence of ischemia. Isolated T wave inversion in aVL. No significant change compared to 01/15/2020.   Assessment     ICD-10-CM   1. Chronic diastolic heart failure (HCC)  I50.32   2. Essential hypertension  I10 EKG 12-Lead  3. Fatty liver  K76.0     Medications Discontinued During This Encounter  Medication Reason  . ALPRAZolam (XANAX) 0.5 MG tablet Error  . Cholecalciferol (VITAMIN D) 125 MCG (5000 UT) CAPS Error  . cyclobenzaprine (FLEXERIL) 10 MG tablet Error  . pravastatin (PRAVACHOL) 20 MG tablet Error  . Zinc 100 MG TABS Error     Recommendations:   [name] [name]  is a  60 y.o. Caucasian female with anxiety, chronic diastolic heart failure, normal coronary arteries by angiography in 2005 and a normal stress echocardiogram in 2014, also negative nuclear stress test in 2018 and has hyperdynamic left ventricle with grade 1 diastolic dysfunction by echocardiogram in January 2020.  Past medical history significant for hypertension, diabetes mellitus and hyperlipidemia.  This is a 22-monthoffice visit for follow-up of congestive heart failure and hypertension, fortunately over the past 6 months she has used Lasix only twice for dyspnea and weight gain.  Today there is no clinical evidence of heart failure.  We discussed again regarding fatty liver, weight loss.  She also has early signs of cirrhosis of the liver.  We again discussed reducing her calorie intake.  From hypertension standpoint, she is tolerating all her medications well and blood pressures well controlled.  No changes were done by me today, I will see her back in a year or sooner if problems.      JAdrian Prows MD, [facility]5/01/2021, 2:38 PM Office: [phone]: [phone]

## 2021-02-17 DIAGNOSIS — R69 Illness, unspecified: Secondary | ICD-10-CM | POA: Diagnosis not present

## 2021-03-18 ENCOUNTER — Telehealth: Payer: Self-pay | Admitting: Cardiology

## 2021-03-18 DIAGNOSIS — I5032 Chronic diastolic (congestive) heart failure: Secondary | ICD-10-CM

## 2021-03-18 DIAGNOSIS — R0602 Shortness of breath: Secondary | ICD-10-CM

## 2021-03-18 NOTE — Telephone Encounter (Signed)
ON-CALL CARDIOLOGY 03/18/21  Patient's name: [name] [name].   MRN: [id number].    DOB: September 21, 1960 Primary care provider: Glenda Chroman, MD. Primary cardiologist: Dr. Adrian Prows  Interaction regarding this patient's care today: She called on-call service to evaluate her symptoms of shortness of breath and chest fullness over the phone.  Patient has history of heart failure with preserved EF and currently on medical therapy.  Last office visit in May 2022 and no changes were made during that encounter.  Patient states for the last several days she been experiencing shortness of breath mostly with effort related activities.  Her weight has remained stable, no significant lower extremity swelling, no orthopnea, no paroxysmal nocturnal dyspnea.  Most recent BP 154/90 mmHg  Does not describe anginal discomfort but complains of chest fullness.  Patient states that she also has underlying anxiety and has not been taking her medications for it.  Impression:   ICD-10-CM   1. SOB (shortness of breath)  R06.02     2. Chronic diastolic heart failure (HCC)  I50.32       Recommendations:  Patient does not complain of anginal discomfort.  And based on the history provided does not appear to be having heart failure exacerbation.  I have asked her to take Lasix 40 mg p.o. x1 along with her potassium supplement as prescribed for as needed basis.  Low-salt diet recommended  Patient is asked to seek medical attention by going to the closest ER if her symptoms increase in intensity, frequency, and/or duration or has typical chest pain as discussed.    Patient was thankful for the call back.    She will call on Monday to schedule a office visit if her symptoms continue.  We will update her primary cardiologist.  Telephone encounter total time: 10 minutes.  Rex Kras, [place], [facility]  Pager: [phone] Office: [phone]

## 2021-03-21 ENCOUNTER — Telehealth: Payer: Self-pay

## 2021-03-21 NOTE — Telephone Encounter (Signed)
-----   Message from [place], [place] sent at 03/18/2021  5:46 PM EDT ----- Regarding: Follow up appointment after hospital discharge. Please call the patient to see if she continues to have symptoms of shortness of breath or chest fullness.  If the symptoms are still present please schedule a follow-up visit with Dr. Adrian Prows or [place] in the coming week for reevaluation.  JG: Just FYI   TY  ST.

## 2021-03-21 NOTE — Telephone Encounter (Signed)
[name] [name]

## 2021-03-21 NOTE — Telephone Encounter (Signed)
Patient says that she is feeling a lot better and she will let us know if this changes.

## 2021-03-29 DIAGNOSIS — F410 Panic disorder [episodic paroxysmal anxiety] without agoraphobia: Secondary | ICD-10-CM | POA: Diagnosis not present

## 2021-03-29 DIAGNOSIS — F4312 Post-traumatic stress disorder, chronic: Secondary | ICD-10-CM | POA: Diagnosis not present

## 2021-03-29 DIAGNOSIS — R69 Illness, unspecified: Secondary | ICD-10-CM | POA: Diagnosis not present

## 2021-03-31 ENCOUNTER — Ambulatory Visit: Payer: Medicare HMO | Admitting: Cardiology

## 2021-04-03 ENCOUNTER — Emergency Department (HOSPITAL_COMMUNITY)
Admission: EM | Admit: 2021-04-03 | Discharge: 2021-04-03 | Disposition: A | Discharge status: Home / Self Care | Payer: 59 | Attending: Emergency Medicine | Admitting: Emergency Medicine

## 2021-04-03 ENCOUNTER — Other Ambulatory Visit: Payer: Self-pay

## 2021-04-03 ENCOUNTER — Encounter (HOSPITAL_COMMUNITY): Payer: Self-pay | Admitting: Emergency Medicine

## 2021-04-03 DIAGNOSIS — F419 Anxiety disorder, unspecified: Secondary | ICD-10-CM | POA: Insufficient documentation

## 2021-04-03 DIAGNOSIS — Z79899 Other long term (current) drug therapy: Secondary | ICD-10-CM | POA: Insufficient documentation

## 2021-04-03 DIAGNOSIS — I5032 Chronic diastolic (congestive) heart failure: Secondary | ICD-10-CM | POA: Insufficient documentation

## 2021-04-03 DIAGNOSIS — E1140 Type 2 diabetes mellitus with diabetic neuropathy, unspecified: Secondary | ICD-10-CM | POA: Insufficient documentation

## 2021-04-03 DIAGNOSIS — I110 Hypertensive heart disease with heart failure: Secondary | ICD-10-CM | POA: Insufficient documentation

## 2021-04-03 DIAGNOSIS — Z7984 Long term (current) use of oral hypoglycemic drugs: Secondary | ICD-10-CM | POA: Insufficient documentation

## 2021-04-03 DIAGNOSIS — Z794 Long term (current) use of insulin: Secondary | ICD-10-CM | POA: Diagnosis not present

## 2021-04-03 DIAGNOSIS — R69 Illness, unspecified: Secondary | ICD-10-CM | POA: Diagnosis not present

## 2021-04-03 DIAGNOSIS — Z7982 Long term (current) use of aspirin: Secondary | ICD-10-CM | POA: Insufficient documentation

## 2021-04-03 DIAGNOSIS — F410 Panic disorder [episodic paroxysmal anxiety] without agoraphobia: Secondary | ICD-10-CM

## 2021-04-03 LAB — COMPREHENSIVE METABOLIC PANEL
ALT: 30 U/L (ref 0–44)
AST: 25 U/L (ref 15–41)
Albumin: 4 g/dL (ref 3.5–5.0)
Alkaline Phosphatase: 62 U/L (ref 38–126)
Anion gap: 11 (ref 5–15)
BUN: 18 mg/dL (ref 8–23)
CO2: 23 mmol/L (ref 22–32)
Calcium: 9.4 mg/dL (ref 8.9–10.3)
Chloride: 103 mmol/L (ref 98–111)
Creatinine, Ser: 1.02 mg/dL — ABNORMAL HIGH (ref 0.44–1.00)
GFR, Estimated: >60 mL/min (ref >60)
Glucose, Bld: 186 mg/dL — ABNORMAL HIGH (ref 70–99)
Potassium: 3.6 mmol/L (ref 3.5–5.1)
Sodium: 137 mmol/L (ref 135–145)
Total Bilirubin: 0.8 mg/dL (ref 0.3–1.2)
Total Protein: 6.4 g/dL — ABNORMAL LOW (ref 6.5–8.1)

## 2021-04-03 LAB — CBC WITH DIFFERENTIAL/PLATELET
Abs Immature Granulocytes: 0.04 10*3/uL (ref 0.00–0.07)
Basophils Absolute: 0 10*3/uL (ref 0.0–0.1)
Basophils Relative: 1 %
Eosinophils Absolute: 0 10*3/uL (ref 0.0–0.5)
Eosinophils Relative: 1 %
HCT: 38.2 % (ref 36.0–46.0)
Hemoglobin: 12.7 g/dL (ref 12.0–15.0)
Immature Granulocytes: 1 %
Lymphocytes Relative: 47 %
Lymphs Abs: 2.5 10*3/uL (ref 0.7–4.0)
MCH: 30.8 pg (ref 26.0–34.0)
MCHC: 33.2 g/dL (ref 30.0–36.0)
MCV: 92.7 fL (ref 80.0–100.0)
Monocytes Absolute: 0.4 10*3/uL (ref 0.1–1.0)
Monocytes Relative: 7 %
Neutro Abs: 2.3 10*3/uL (ref 1.7–7.7)
Neutrophils Relative %: 43 %
Platelets: 172 10*3/uL (ref 150–400)
RBC: 4.12 MIL/uL (ref 3.87–5.11)
RDW: 12.5 % (ref 11.5–15.5)
WBC: 5.3 10*3/uL (ref 4.0–10.5)
nRBC: 0 % (ref 0.0–0.2)

## 2021-04-03 LAB — TROPONIN I (HIGH SENSITIVITY)
Troponin I (High Sensitivity): 4 ng/L (ref <18)
Troponin I (High Sensitivity): 4 ng/L (ref <18)

## 2021-04-03 LAB — LIPASE, BLOOD: Lipase: 34 U/L (ref 11–51)

## 2021-04-03 MED ORDER — ZIPRASIDONE MESYLATE 20 MG IM SOLR
20.0000 mg | Freq: Once | INTRAMUSCULAR | Status: AC
  Administered 2021-04-03: 20 mg via INTRAMUSCULAR
  Filled 2021-04-03: qty 20

## 2021-04-03 MED ORDER — LORAZEPAM 2 MG/ML IJ SOLN
2.0000 mg | Freq: Once | INTRAMUSCULAR | Status: AC
  Administered 2021-04-03: 2 mg via INTRAMUSCULAR
  Filled 2021-04-03: qty 1

## 2021-04-03 NOTE — ED Provider Notes (Addendum)
[facility] EMERGENCY DEPARTMENT Provider Note   CSN: [id number] Arrival date & time: 04/03/21  0058     History Chief Complaint  Patient presents with   Panic Attack    [name] [name] is a 60 y.o. female.  Level 5 caveat for psychiatric disorder.  Patient unable to give any history.  She is lying in the room babbling and saying "mama" repeatedly.  She is also asking "is he dead?"  Per nursing, patient is a sister of another patient in the ED.  She received a call from him stating that he was in pain.  This apparently triggered her anxiety and PTSD.  When patient was wheeled back to the room she kneeled by the side of the bed and said she was too weak to get in the bed. Patient's brother states she does have severe anxiety. Patient not able to give any history currently.  Will not answer questions Per medication list she is on Wellbutrin at home as well as Xanax and celexa and neurontin.  The history is provided by the patient. The history is limited by the condition of the patient.      Past Medical History:  Diagnosis Date   Abnormal EKG    Allergy    Anxiety    PTSD   CHF (congestive heart failure) (HCC)    Depression    Depression    Diabetes mellitus without complication (HCC)    Dyspnea    GERD (gastroesophageal reflux disease)    Hyperlipidemia    Hypertension    Unspecified   IBS (irritable bowel syndrome)    Kidney stone    Panic disorder 03/20/2019   Seizures ([place])    Reports last seizure was 30 + years ago    Sleep apnea    no CPAP worn   Stroke [facility]     Patient Active Problem List   Diagnosis Date Noted   AKI (acute kidney injury) ([place])    Chronic diastolic heart failure (HCC)    Class 1 obesity due to excess calories with body mass index (BMI) of 31.0 to 31.9 in adult    Type 2 diabetes mellitus with diabetic neuropathy, without long-term current use of insulin (HCC)    Gastroesophageal reflux disease    Hyperkalemia 02/08/2020   Panic disorder  03/20/2019   External hemorrhoids 12/08/2014   Rectal bleeding 11/22/2014   Hemorrhoids, internal 11/22/2014   Essential hypertension 06/08/2009   DYSPNEA 06/08/2009   ABNORMAL EKG 06/08/2009    Past Surgical History:  Procedure Laterality Date   HYSTEROSCOPY  2001   submucous myomectomy   TOTAL ABDOMINAL HYSTERECTOMY  2002   TAH BSO  pathology showed leiomyoma, adenomyosis, simple and complex hyperplasia without atypia   TUMOR REMOVAL     from left ear     OB History     Gravida  0   Para  0   Term  0   Preterm  0   AB  0   Living  0      SAB  0   IAB  0   Ectopic  0   Multiple  0   Live Births              Family History  Problem Relation Age of Onset   Cancer Father        Prostate   High blood pressure Father    Coronary artery disease Paternal Grandfather    Uterine cancer Mother  Irritable bowel syndrome Mother    Stroke Mother    Osteoporosis Mother    Heart disease Maternal Grandmother    Heart attack Brother 108   Cancer Brother        Prostate   Colitis Neg Hx    Esophageal cancer Neg Hx    Stomach cancer Neg Hx    Rectal cancer Neg Hx     Social History   Tobacco Use   Smoking status: Never   Smokeless tobacco: Never  Vaping Use   Vaping Use: Never used  Substance Use Topics   Alcohol use: No    Alcohol/week: 0.0 standard drinks   Drug use: No    Home Medications Prior to Admission medications   Medication Sig Start Date End Date Taking? Authorizing Provider  ALPRAZolam Duanne Moron) 1 MG tablet Take 1 mg by mouth 3 (three) times daily as needed.     [provider]  aspirin 81 MG tablet Take 81 mg by mouth at bedtime.     [provider]  buPROPion (WELLBUTRIN XL) 150 MG 24 hr tablet Take 150 mg by mouth every morning. 06/02/20   [provider]  Choline Fenofibrate (FENOFIBRIC ACID) 135 MG CPDR Take 1 capsule by mouth daily. 11/19/20   [provider]  citalopram (CELEXA) 40 MG tablet  Take 40 mg by mouth daily.    [provider]  furosemide (LASIX) 20 MG tablet Twice daily as needed if weight >3 Lbs Patient taking differently: 40 mg. Twice daily as needed if weight >3 Lbs 05/29/19   Adrian Prows, MD  gabapentin (NEURONTIN) 400 MG capsule Take 400 mg by mouth 2 (two) times daily.    [provider]  glimepiride (AMARYL) 4 MG tablet Take 4 mg by mouth daily. 01/14/20   [provider]  metoprolol succinate (TOPROL-XL) 50 MG 24 hr tablet Take 1 tablet (50 mg total) by mouth daily. Take with a meal. 07/29/20   Adrian Prows, MD  omeprazole (PRILOSEC) 20 MG capsule Take 20 mg by mouth at bedtime.  07/02/14   [provider]  potassium chloride SA (KLOR-CON) 20 MEQ tablet Take 1 tablet (20 mEq total) by mouth daily as needed (With Furosemide). Only if furosemide taken. 02/11/20   Adrian Prows, MD  Semaglutide,0.25 or 0.'5MG'$ /DOS, 2 MG/1.5ML SOPN Inject 2.5 mLs into the skin every Wednesday.  08/14/18   [provider]  spironolactone (ALDACTONE) 50 MG tablet TAKE 1 TABLET BY MOUTH EVERY DAY IN THE MORNING 12/20/20   Adrian Prows, MD  verapamil (CALAN-SR) 120 MG CR tablet Take 120 mg by mouth at bedtime.  07/02/14   [provider]    Allergies    Codeine, Doxycycline, and Erythromycin  Review of Systems   Review of Systems  Unable to perform ROS: Psychiatric disorder   Physical Exam Updated Vital Signs BP (!) 144/59 (BP Location: Left Arm)   Pulse 69   Temp 97.6 F (36.4 C) (Oral)   Resp 16   Ht '5\' 1"'$  (1.549 m)   Wt 72.6 kg   SpO2 100%   BMI 30.23 kg/m   Physical Exam Vitals and nursing note reviewed.  Constitutional:      General: She is in acute distress.     Appearance: She is well-developed.     Comments: Laying in bed supine, saying "mama" repeatedly. And asking "is he dead?"  HENT:     Head: Normocephalic and atraumatic.     Mouth/Throat:  Pharynx: No oropharyngeal exudate.  Eyes:     Conjunctiva/sclera:  Conjunctivae normal.     Pupils: Pupils are equal, round, and reactive to light.  Neck:     Comments: No meningismus. Cardiovascular:     Rate and Rhythm: Normal rate and regular rhythm.     Heart sounds: Normal heart sounds. No murmur heard. Pulmonary:     Effort: Pulmonary effort is normal. No respiratory distress.     Breath sounds: Normal breath sounds.  Chest:     Chest wall: No tenderness.  Abdominal:     Palpations: Abdomen is soft.     Tenderness: There is no abdominal tenderness. There is no guarding or rebound.  Musculoskeletal:        General: No tenderness. Normal range of motion.     Cervical back: Normal range of motion and neck supple.  Skin:    General: Skin is warm.  Neurological:     Mental Status: She is alert.     Motor: No abnormal muscle tone.     Comments: Moves all extremities, would not follow commands, Does not answer questions  Psychiatric:     Comments: Anxious    ED Results / Procedures / Treatments   Labs (all labs ordered are listed, but only abnormal results are displayed) Labs Reviewed  COMPREHENSIVE METABOLIC PANEL - Abnormal; Notable for the following components:      Result Value   Glucose, Bld 186 (*)    Creatinine, Ser 1.02 (*)    Total Protein 6.4 (*)    All other components within normal limits  CBC WITH DIFFERENTIAL/PLATELET  LIPASE, BLOOD  URINALYSIS, ROUTINE W REFLEX MICROSCOPIC  TROPONIN I (HIGH SENSITIVITY)  TROPONIN I (HIGH SENSITIVITY)    EKG EKG Interpretation  Date/Time:  Sunday April 03 2021 01:09:13 EDT Ventricular Rate:  69 PR Interval:  176 QRS Duration: 96 QT Interval:  439 QTC Calculation: 471 R Axis:   15 Text Interpretation: Sinus rhythm Abnormal T, consider ischemia, lateral leads Minimal ST elevation, anterior leads Baseline wander in lead(s) II III aVF wandering baseline No significant change was found Confirmed by Ezequiel Essex [phone]) on 04/03/2021 1:47:42 AM  Radiology No results  found.  Procedures Procedures   Medications Ordered in ED Medications - No data to display  ED Course  I have reviewed the triage vital signs and the nursing notes.  Pertinent labs & imaging results that were available during my care of the patient were reviewed by me and considered in my medical decision making (see chart for details).    MDM Rules/Calculators/A&P                           Altered mental status secondary to anxiety apparently triggered by her brother's visit to the ED stating that he was in pain.  Patient unable to give any history currently.  She is extremely anxious and will not answer questions.  Ativan and Geodon to be given  Patient sedated after medications.  Screening labs are unremarkable.  Troponin is negative.  Remains sedated on reevaluation.  Care to be transferred to oncoming team at shift change to await improvement of mental status and reassessment. Final Clinical Impression(s) / ED Diagnoses Final diagnoses:  None    Rx / DC Orders ED Discharge Orders     None        Daysean Tinkham, Annie Main, MD 04/03/21 [id number]    Ezequiel Essex, MD 04/03/21 [phone]

## 2021-04-03 NOTE — ED Triage Notes (Signed)
Pt is the sister of pt we have in room 7. Pt received a call from her brother shortly after placing him in a room and stated that pt was telling her how much he was hurting and that the phone call "triggered her PTSD" and that she needed to be seen for her anxiety. Upon wheeling pt to room, pt was asked to get into the bed where she proceeded to kneel by the side of the bed and stated she was "too weak". Pt initially walked in with her brother but somehow could no longer do so d/t her extreme anxiety.

## 2021-04-03 NOTE — ED Provider Notes (Signed)
7:30 AM-checkout from Dr. Wyvonnia Dusky to evaluate patient after she awakens.  He is sedated her for severe anxiety, suspected panic attack.  11:45 AM-she is now awake and alert and has been able to walk to the bathroom.  She is limping somewhat because she states her knees hurt.  She states she has arthritis in the knees and plans on seeing her orthopedist about it.  Findings discussed with patient and her husband who was in the room, all questions were answered.  Vital signs are stable and she is ready to go home.   Daleen Bo, MD 04/03/21 1212

## 2021-04-03 NOTE — ED Notes (Signed)
Tried to talk to pt and advise her we needed her to walk per RN BM pt could not even open eye and began to snore.

## 2021-04-05 DIAGNOSIS — R69 Illness, unspecified: Secondary | ICD-10-CM | POA: Diagnosis not present

## 2021-04-07 DIAGNOSIS — Z299 Encounter for prophylactic measures, unspecified: Secondary | ICD-10-CM | POA: Diagnosis not present

## 2021-04-07 DIAGNOSIS — I509 Heart failure, unspecified: Secondary | ICD-10-CM | POA: Diagnosis not present

## 2021-04-07 DIAGNOSIS — E1165 Type 2 diabetes mellitus with hyperglycemia: Secondary | ICD-10-CM | POA: Diagnosis not present

## 2021-04-07 DIAGNOSIS — I10 Essential (primary) hypertension: Secondary | ICD-10-CM | POA: Diagnosis not present

## 2021-04-10 DIAGNOSIS — I10 Essential (primary) hypertension: Secondary | ICD-10-CM | POA: Diagnosis not present

## 2021-04-10 DIAGNOSIS — R69 Illness, unspecified: Secondary | ICD-10-CM | POA: Diagnosis not present

## 2021-04-11 DIAGNOSIS — J069 Acute upper respiratory infection, unspecified: Secondary | ICD-10-CM | POA: Diagnosis not present

## 2021-04-11 DIAGNOSIS — Z789 Other specified health status: Secondary | ICD-10-CM | POA: Diagnosis not present

## 2021-04-11 DIAGNOSIS — Z299 Encounter for prophylactic measures, unspecified: Secondary | ICD-10-CM | POA: Diagnosis not present

## 2021-04-11 DIAGNOSIS — E118 Type 2 diabetes mellitus with unspecified complications: Secondary | ICD-10-CM | POA: Diagnosis not present

## 2021-04-15 DIAGNOSIS — R5383 Other fatigue: Secondary | ICD-10-CM | POA: Diagnosis not present

## 2021-04-15 DIAGNOSIS — Z79899 Other long term (current) drug therapy: Secondary | ICD-10-CM | POA: Diagnosis not present

## 2021-04-25 ENCOUNTER — Other Ambulatory Visit: Payer: Self-pay | Admitting: Cardiology

## 2021-04-25 DIAGNOSIS — I10 Essential (primary) hypertension: Secondary | ICD-10-CM

## 2021-04-25 DIAGNOSIS — I5032 Chronic diastolic (congestive) heart failure: Secondary | ICD-10-CM

## 2021-04-28 ENCOUNTER — Telehealth: Payer: Self-pay | Admitting: Cardiology

## 2021-04-28 DIAGNOSIS — R69 Illness, unspecified: Secondary | ICD-10-CM | POA: Diagnosis not present

## 2021-04-28 NOTE — Telephone Encounter (Signed)
Please let her know that there is nothing specific to do with regard to heart failure and COVID, keep her self hydrated but still watch out for excess calories and fatty meals.

## 2021-04-28 NOTE — Telephone Encounter (Signed)
Pt called to say she tested positive for Covid-19. Pt spoke w/PCP who told her to be in touch w/our office to provide this info since she also has congestive heart failure. Please advise.

## 2021-04-29 DIAGNOSIS — I10 Essential (primary) hypertension: Secondary | ICD-10-CM | POA: Diagnosis not present

## 2021-04-29 DIAGNOSIS — Z299 Encounter for prophylactic measures, unspecified: Secondary | ICD-10-CM | POA: Diagnosis not present

## 2021-04-29 DIAGNOSIS — I509 Heart failure, unspecified: Secondary | ICD-10-CM | POA: Diagnosis not present

## 2021-04-29 DIAGNOSIS — U071 COVID-19: Secondary | ICD-10-CM | POA: Diagnosis not present

## 2021-05-11 DIAGNOSIS — R69 Illness, unspecified: Secondary | ICD-10-CM | POA: Diagnosis not present

## 2021-05-11 DIAGNOSIS — I10 Essential (primary) hypertension: Secondary | ICD-10-CM | POA: Diagnosis not present

## 2021-05-12 DIAGNOSIS — M25562 Pain in left knee: Secondary | ICD-10-CM | POA: Diagnosis not present

## 2021-05-12 DIAGNOSIS — M25561 Pain in right knee: Secondary | ICD-10-CM | POA: Diagnosis not present

## 2021-05-23 ENCOUNTER — Other Ambulatory Visit: Payer: Self-pay | Admitting: Cardiology

## 2021-05-23 DIAGNOSIS — I10 Essential (primary) hypertension: Secondary | ICD-10-CM

## 2021-05-30 DIAGNOSIS — R69 Illness, unspecified: Secondary | ICD-10-CM | POA: Diagnosis not present

## 2021-05-30 DIAGNOSIS — F330 Major depressive disorder, recurrent, mild: Secondary | ICD-10-CM | POA: Diagnosis not present

## 2021-05-31 DIAGNOSIS — I10 Essential (primary) hypertension: Secondary | ICD-10-CM | POA: Diagnosis not present

## 2021-05-31 DIAGNOSIS — R69 Illness, unspecified: Secondary | ICD-10-CM | POA: Diagnosis not present

## 2021-06-10 DIAGNOSIS — R69 Illness, unspecified: Secondary | ICD-10-CM | POA: Diagnosis not present

## 2021-06-10 DIAGNOSIS — I10 Essential (primary) hypertension: Secondary | ICD-10-CM | POA: Diagnosis not present

## 2021-06-15 DIAGNOSIS — R69 Illness, unspecified: Secondary | ICD-10-CM | POA: Diagnosis not present

## 2021-06-15 DIAGNOSIS — F330 Major depressive disorder, recurrent, mild: Secondary | ICD-10-CM | POA: Diagnosis not present

## 2021-07-11 DIAGNOSIS — I10 Essential (primary) hypertension: Secondary | ICD-10-CM | POA: Diagnosis not present

## 2021-07-11 DIAGNOSIS — R69 Illness, unspecified: Secondary | ICD-10-CM | POA: Diagnosis not present

## 2021-07-14 DIAGNOSIS — Z6829 Body mass index (BMI) 29.0-29.9, adult: Secondary | ICD-10-CM | POA: Diagnosis not present

## 2021-07-14 DIAGNOSIS — Z299 Encounter for prophylactic measures, unspecified: Secondary | ICD-10-CM | POA: Diagnosis not present

## 2021-07-14 DIAGNOSIS — R69 Illness, unspecified: Secondary | ICD-10-CM | POA: Diagnosis not present

## 2021-07-14 DIAGNOSIS — F419 Anxiety disorder, unspecified: Secondary | ICD-10-CM | POA: Diagnosis not present

## 2021-07-14 DIAGNOSIS — I10 Essential (primary) hypertension: Secondary | ICD-10-CM | POA: Diagnosis not present

## 2021-07-14 DIAGNOSIS — Z23 Encounter for immunization: Secondary | ICD-10-CM | POA: Diagnosis not present

## 2021-07-14 DIAGNOSIS — E7800 Pure hypercholesterolemia, unspecified: Secondary | ICD-10-CM | POA: Diagnosis not present

## 2021-07-14 DIAGNOSIS — Z0000 Encounter for general adult medical examination without abnormal findings: Secondary | ICD-10-CM | POA: Diagnosis not present

## 2021-07-14 DIAGNOSIS — Z79899 Other long term (current) drug therapy: Secondary | ICD-10-CM | POA: Diagnosis not present

## 2021-07-14 DIAGNOSIS — Z1331 Encounter for screening for depression: Secondary | ICD-10-CM | POA: Diagnosis not present

## 2021-07-14 DIAGNOSIS — E1165 Type 2 diabetes mellitus with hyperglycemia: Secondary | ICD-10-CM | POA: Diagnosis not present

## 2021-07-15 DIAGNOSIS — E7800 Pure hypercholesterolemia, unspecified: Secondary | ICD-10-CM | POA: Diagnosis not present

## 2021-07-15 DIAGNOSIS — Z0000 Encounter for general adult medical examination without abnormal findings: Secondary | ICD-10-CM | POA: Diagnosis not present

## 2021-07-15 DIAGNOSIS — Z79899 Other long term (current) drug therapy: Secondary | ICD-10-CM | POA: Diagnosis not present

## 2021-07-15 DIAGNOSIS — R69 Illness, unspecified: Secondary | ICD-10-CM | POA: Diagnosis not present

## 2021-07-27 ENCOUNTER — Telehealth: Payer: Self-pay

## 2021-07-27 DIAGNOSIS — F330 Major depressive disorder, recurrent, mild: Secondary | ICD-10-CM | POA: Diagnosis not present

## 2021-07-27 DIAGNOSIS — R69 Illness, unspecified: Secondary | ICD-10-CM | POA: Diagnosis not present

## 2021-07-27 NOTE — Telephone Encounter (Signed)
Patient called and stated that she recently went to the pharmacy and the pharmacist told her the verapamil makes the heart work harder and she should be careful taking it and should talk to her cardiologists about what stage her CHF she is in. Now she is worried and wants you to give her a call. She does have MyChart but prefers you to call and not send a message.

## 2021-07-27 NOTE — Telephone Encounter (Signed)
Please let her know not all heart failure is the same. She has diastolic heart failure and needs this medication to relax her heart.  Diastolic function means the heart has to relax to receive the blood so it can pump the blood out. If relaxation is impaired, then the blood coming to the heart is restricted and can cause dyspnea and reduced functional capacity and fluid build up. Exercise, weight loss, control of BP, salt restriction will improve this.

## 2021-07-29 ENCOUNTER — Encounter: Payer: Self-pay | Admitting: Cardiology

## 2021-08-01 NOTE — Telephone Encounter (Signed)
From pt

## 2021-08-03 NOTE — Telephone Encounter (Signed)
Patient stated that she had already spoken with Baxter Flattery 6 days ago.

## 2021-08-10 DIAGNOSIS — I10 Essential (primary) hypertension: Secondary | ICD-10-CM | POA: Diagnosis not present

## 2021-08-10 DIAGNOSIS — E7800 Pure hypercholesterolemia, unspecified: Secondary | ICD-10-CM | POA: Diagnosis not present

## 2021-09-09 DIAGNOSIS — E7800 Pure hypercholesterolemia, unspecified: Secondary | ICD-10-CM | POA: Diagnosis not present

## 2021-10-04 DIAGNOSIS — E1165 Type 2 diabetes mellitus with hyperglycemia: Secondary | ICD-10-CM | POA: Diagnosis not present

## 2021-10-04 DIAGNOSIS — I509 Heart failure, unspecified: Secondary | ICD-10-CM | POA: Diagnosis not present

## 2021-10-04 DIAGNOSIS — Z299 Encounter for prophylactic measures, unspecified: Secondary | ICD-10-CM | POA: Diagnosis not present

## 2021-10-04 DIAGNOSIS — I10 Essential (primary) hypertension: Secondary | ICD-10-CM | POA: Diagnosis not present

## 2021-10-06 DIAGNOSIS — M25562 Pain in left knee: Secondary | ICD-10-CM | POA: Diagnosis not present

## 2021-10-06 DIAGNOSIS — M25561 Pain in right knee: Secondary | ICD-10-CM | POA: Diagnosis not present

## 2021-10-11 DIAGNOSIS — E7800 Pure hypercholesterolemia, unspecified: Secondary | ICD-10-CM | POA: Diagnosis not present

## 2021-10-11 DIAGNOSIS — I10 Essential (primary) hypertension: Secondary | ICD-10-CM | POA: Diagnosis not present

## 2021-11-07 ENCOUNTER — Encounter: Payer: Self-pay | Admitting: Nutrition

## 2021-11-07 ENCOUNTER — Encounter: Payer: Medicare Other | Attending: Internal Medicine | Admitting: Nutrition

## 2021-11-07 ENCOUNTER — Other Ambulatory Visit: Payer: Self-pay

## 2021-11-07 ENCOUNTER — Telehealth: Payer: Self-pay

## 2021-11-07 DIAGNOSIS — Z713 Dietary counseling and surveillance: Secondary | ICD-10-CM | POA: Diagnosis not present

## 2021-11-07 DIAGNOSIS — K219 Gastro-esophageal reflux disease without esophagitis: Secondary | ICD-10-CM | POA: Diagnosis not present

## 2021-11-07 DIAGNOSIS — E11649 Type 2 diabetes mellitus with hypoglycemia without coma: Secondary | ICD-10-CM | POA: Insufficient documentation

## 2021-11-07 DIAGNOSIS — I10 Essential (primary) hypertension: Secondary | ICD-10-CM | POA: Diagnosis not present

## 2021-11-07 DIAGNOSIS — E669 Obesity, unspecified: Secondary | ICD-10-CM

## 2021-11-07 DIAGNOSIS — E1140 Type 2 diabetes mellitus with diabetic neuropathy, unspecified: Secondary | ICD-10-CM

## 2021-11-07 DIAGNOSIS — I5032 Chronic diastolic (congestive) heart failure: Secondary | ICD-10-CM

## 2021-11-07 NOTE — Progress Notes (Signed)
Medical Nutrition Therapy  Appointment Start time:  1300  Appointment End time:  1400  Primary concerns today: Diabetes Type 2, Obesity  Referral diagnosis: E11.8, E66.9 Preferred learning style: viscual  Learning readiness: ready    NUTRITION ASSESSMENT  Complains of eating a lot. Has anxiety and eats when stressed, nervous or depressed. Sees a therapist. Sees Dr. Woody Seller as PCP. Glimiperide BID, Ozempic 0.5 mg. Weekly. FBS 198 mg/dl.  Testing only once a day.  Anthropometrics  Wt Readings from Last 3 Encounters:  04/03/21 160 lb (72.6 kg)  01/13/21 161 lb 12.8 oz (73.4 kg)  09/21/20 162 lb (73.5 kg)   Ht Readings from Last 3 Encounters:  04/03/21 5\' 1"  (1.549 m)  01/13/21 5\' 1"  (1.549 m)  09/21/20 5\' 1"  (1.549 m)   There is no height or weight on file to calculate BMI. @BMIFA @ Facility age limit for growth percentiles is 20 years. Facility age limit for growth percentiles is 20 years.    Clinical Medical Hx: DM Type 2, Obesity, CHF Medications: Glimperide and Ozempic Labs: 9.5%   Notable Signs/Symptoms: Increased thirst, increased hunger  Lifestyle & Dietary Hx Married and she does the shopping and cooking.   Estimated daily fluid intake: 16-32 oz Supplements:  Sleep: 7 hours Stress / self-care: family pets, health issues, worries about her mom. Current average weekly physical activity: ADL  24-Hr Dietary Recall First Meal: Egg sandwich, mayo, bacon, tomato, Diet Dr. Malachi Bonds. Snack:  Second Meal: shrimp and sweet potato, hushpuppies, sweet tea Snack:  Third Meal: skipped Snack:  Beverages: soda, water and tea  Estimated Energy Needs Calories: 1200 Carbohydrate: 135g Protein: 90g Fat: 33g   NUTRITION DIAGNOSIS  NB-1.1 Food and nutrition-related knowledge deficit As related to Diabetes Type 2.  As evidenced by A1C 9.5%.   NUTRITION INTERVENTION  Nutrition education (E-1) on the following topics:  Nutrition and Diabetes education provided on My  Plate, CHO counting, meal planning, portion sizes, timing of meals, avoiding snacks between meals unless having a low blood sugar, target ranges for A1C and blood sugars, signs/symptoms and treatment of hyper/hypoglycemia, monitoring blood sugars, taking medications as prescribed, benefits of exercising 30 minutes per day and prevention of complications of DM.  Lifestyle Medicine - Whole Food, Plant Predominant Nutrition is highly recommended: Eat Plenty of vegetables, Mushrooms, fruits, Legumes, Whole Grains, Nuts, seeds in lieu of processed meats, processed snacks/pastries red meat, poultry, eggs.    -It is better to avoid simple carbohydrates including: Cakes, Sweet Desserts, Ice Cream, Soda (diet and regular), Sweet Tea, Candies, Chips, Cookies, Store Bought Juices, Alcohol in Excess of  1-2 drinks a day, Lemonade,  Artificial Sweeteners, Doughnuts, Coffee Creamers, "Sugar-free" Products, etc, etc.  This is not a complete list.....  Exercise: If you are able: 30 -60 minutes a day ,4 days a week, or 150 minutes a week.  The longer the better.  Combine stretch, strength, and aerobic activities.  If you were told in the past that you have high risk for cardiovascular diseases, you may seek evaluation by your heart doctor prior to initiating moderate to intense exercise programs Handouts Provided Include  My Plate Method Meal Plan Card  Learning Style & Readiness for Change Teaching method utilized: Visual & Auditory  Demonstrated degree of understanding via: Teach Back  Barriers to learning/adherence to lifestyle change: none  Goals Established by Pt Goals  Eat three meals per day Avoid snacks between meals Drink only water Cut out diet sodas and processed foods Check into silver  sneakers at the [facility] FBS less than 130 mg/dl, at bedtime less than 150 mg/dl. Call cardiac doctor about your weight gain/fluid. Get A1C down to 7% Lifestyle Medicine - Whole Food, Plant Predominant Nutrition is  highly recommended: Eat Plenty of vegetables, Mushrooms, fruits, Legumes, Whole Grains, Nuts, seeds in lieu of processed meats, processed snacks/pastries red meat, poultry, eggs.    -It is better to avoid simple carbohydrates including: Cakes, Sweet Desserts, Ice Cream, Soda (diet and regular), Sweet Tea, Candies, Chips, Cookies, Store Bought Juices, Alcohol in Excess of  1-2 drinks a day, Lemonade,  Artificial Sweeteners, Doughnuts, Coffee Creamers, "Sugar-free" Products, etc, etc.  This is not a complete list.....  Exercise: If you are able: 30 -60 minutes a day ,4 days a week, or 150 minutes a week.  The longer the better.  Combine stretch, strength, and aerobic activities.  If you were told in the past that you have high risk for cardiovascular diseases, you may seek evaluation by your heart doctor prior to initiating moderate to intense exercise programs.  MONITORING & EVALUATION Dietary intake, weekly physical activity, and blood sugars in 1 month.  Next Steps  Patient is to work on meal planning and blood sugars.Marland Kitchen

## 2021-11-07 NOTE — Telephone Encounter (Signed)
2-3 Lbs can be scale related she should use her own and check regularly

## 2021-11-07 NOTE — Patient Instructions (Signed)
Goals  Eat three meals per day Avoid snacks between meals Drink only water Cut out diet sodas and processed foods Check into silver sneakers at the [employer] FBS less than 130 mg/dl, at bedtime less than 150 mg/dl. Call cardiac doctor about your weight gain/fluid. Get A1C down to 7%

## 2021-11-08 NOTE — Telephone Encounter (Signed)
Called and spoke to pt, pt voiced understanding and will keep an eye on her weight. She thinks it was just her clothes because when she weighed this morning after showering she was back to 160lbs.

## 2021-11-08 NOTE — Telephone Encounter (Signed)
Patient didn't answer left a vm

## 2021-11-11 ENCOUNTER — Other Ambulatory Visit: Payer: Self-pay | Admitting: Cardiology

## 2021-11-11 DIAGNOSIS — I5032 Chronic diastolic (congestive) heart failure: Secondary | ICD-10-CM

## 2021-11-11 DIAGNOSIS — I10 Essential (primary) hypertension: Secondary | ICD-10-CM

## 2021-12-06 ENCOUNTER — Ambulatory Visit: Payer: Medicare Other | Admitting: Nutrition

## 2021-12-15 DIAGNOSIS — R69 Illness, unspecified: Secondary | ICD-10-CM | POA: Diagnosis not present

## 2021-12-15 DIAGNOSIS — F330 Major depressive disorder, recurrent, mild: Secondary | ICD-10-CM | POA: Diagnosis not present

## 2021-12-26 DIAGNOSIS — F3341 Major depressive disorder, recurrent, in partial remission: Secondary | ICD-10-CM | POA: Diagnosis not present

## 2021-12-26 DIAGNOSIS — F410 Panic disorder [episodic paroxysmal anxiety] without agoraphobia: Secondary | ICD-10-CM | POA: Diagnosis not present

## 2021-12-26 DIAGNOSIS — F4312 Post-traumatic stress disorder, chronic: Secondary | ICD-10-CM | POA: Diagnosis not present

## 2021-12-26 DIAGNOSIS — R69 Illness, unspecified: Secondary | ICD-10-CM | POA: Diagnosis not present

## 2021-12-29 DIAGNOSIS — F419 Anxiety disorder, unspecified: Secondary | ICD-10-CM | POA: Diagnosis not present

## 2021-12-29 DIAGNOSIS — R69 Illness, unspecified: Secondary | ICD-10-CM | POA: Diagnosis not present

## 2021-12-29 DIAGNOSIS — I10 Essential (primary) hypertension: Secondary | ICD-10-CM | POA: Diagnosis not present

## 2021-12-29 DIAGNOSIS — E669 Obesity, unspecified: Secondary | ICD-10-CM | POA: Diagnosis not present

## 2021-12-29 DIAGNOSIS — E1169 Type 2 diabetes mellitus with other specified complication: Secondary | ICD-10-CM | POA: Diagnosis not present

## 2022-01-10 DIAGNOSIS — F330 Major depressive disorder, recurrent, mild: Secondary | ICD-10-CM | POA: Diagnosis not present

## 2022-01-10 DIAGNOSIS — R69 Illness, unspecified: Secondary | ICD-10-CM | POA: Diagnosis not present

## 2022-01-13 ENCOUNTER — Encounter: Payer: Self-pay | Admitting: Cardiology

## 2022-01-13 ENCOUNTER — Ambulatory Visit: Payer: 59 | Admitting: Cardiology

## 2022-01-13 VITALS — BP 142/64 | HR 77 | Temp 97.9°F | Resp 16 | Ht 61.0 in | Wt 162.2 lb

## 2022-01-13 DIAGNOSIS — I5032 Chronic diastolic (congestive) heart failure: Secondary | ICD-10-CM

## 2022-01-13 DIAGNOSIS — I10 Essential (primary) hypertension: Secondary | ICD-10-CM | POA: Diagnosis not present

## 2022-01-13 DIAGNOSIS — R0609 Other forms of dyspnea: Secondary | ICD-10-CM | POA: Diagnosis not present

## 2022-01-13 NOTE — Progress Notes (Signed)
? ?Primary Physician/Referring:  Glenda Chroman, MD ? ?Patient ID: [name] [name], female    DOB: [birth year], 61 y.o.   MRN: [id number] ? ?Chief Complaint  ?Patient presents with  ? Congestive Heart Failure  ? ?HPI:   ? ?[name]  is a 60 y.o. Caucasian female with chronic anxiety, chronic diastolic heart failure, normal coronary arteries by angiography in 2005 and a normal stress echocardiogram in 2014, also negative nuclear stress test in 2018 and has hyperdynamic left ventricle with grade 1 diastolic dysfunction by echocardiogram in January 2020.  Past medical history significant for hypertension, diabetes mellitus and hyperlipidemia. ? ?Patient presents for 1 year follow-up.  Her primary concern is that she has occasional episodes of lightheadedness upon bending or standing.  She also has worsening knee pain, following with orthopedics to consider bilateral knee replacements.  Last A1c was 8.1%.  Patient reports her weight has been stable at home and she is only occasionally needed Lasix. ? ?Upon further questioning patient does report dyspnea on exertion over the last several months.  However physical activity has generally been limited by knee pain. ? ?Past Medical History:  ?Diagnosis Date  ? Abnormal EKG   ? Allergy   ? Anxiety   ? PTSD  ? CHF (congestive heart failure) ([place])   ? Depression   ? Depression   ? Diabetes mellitus without complication ([place])   ? Dyspnea   ? GERD (gastroesophageal reflux disease)   ? Hyperlipidemia   ? Hypertension   ? Unspecified  ? IBS (irritable bowel syndrome)   ? Kidney stone   ? Panic disorder 03/20/2019  ? Seizures ([place])   ? Reports last seizure was 30 + years ago   ? Sleep apnea   ? no CPAP worn  ? Stroke [facility])   ? ?Past Surgical History:  ?Procedure Laterality Date  ? HYSTEROSCOPY  2001  ? submucous myomectomy  ? TOTAL ABDOMINAL HYSTERECTOMY  2002  ? TAH BSO  pathology showed leiomyoma, adenomyosis, simple and complex hyperplasia without atypia  ? TUMOR REMOVAL    ? from  left ear  ? ?Family History  ?Problem Relation Age of Onset  ? Uterine cancer Mother   ? Irritable bowel syndrome Mother   ? Stroke Mother   ? Osteoporosis Mother   ? Cancer Father   ?     Prostate  ? High blood pressure Father   ? Heart attack Brother 59  ? Cancer Brother   ?     Prostate  ? Heart disease Maternal Grandmother   ? Coronary artery disease Paternal Grandfather   ? Colitis Neg Hx   ? Esophageal cancer Neg Hx   ? Stomach cancer Neg Hx   ? Rectal cancer Neg Hx   ?  ?Social History  ? ?Tobacco Use  ? Smoking status: Never  ? Smokeless tobacco: Never  ?Substance Use Topics  ? Alcohol use: No  ?  Alcohol/week: 0.0 standard drinks  ? ?Marital Status: Married  ?ROS  ?Review of Systems  ?Cardiovascular:  Negative for chest pain, leg swelling, palpitations and paroxysmal nocturnal dyspnea.  ?Respiratory:  Positive for shortness of breath. Negative for hemoptysis.   ?Musculoskeletal:  Positive for joint pain. Negative for joint swelling and muscle weakness.  ?Gastrointestinal:  Negative for melena.  ?Objective  ?Blood pressure (!) 142/64, pulse 77, temperature 97.9 ?F (36.6 ?C), temperature source Temporal, resp. rate 16, height 5' 1"  (1.549 m), weight 162 lb 3.2 oz (73.6 kg), SpO2  96 %.  ? ?  01/13/2022  ? 11:13 AM 01/13/2022  ? 11:02 AM 11/07/2021  ?  1:18 PM  ?Vitals with BMI  ?Height  5' 1"  5' 1"   ?Weight  162 lbs 3 oz 163 lbs 3 oz  ?BMI  30.66 30.85  ?Systolic 132 440   ?Diastolic 64 77   ?Pulse 77 80   ?  ?Orthostatic VS for the past 72 hrs (Last 3 readings): ? Orthostatic BP Patient Position BP Location Cuff Size Orthostatic Pulse  ?01/13/22 1152 122/71 Standing Left Arm Normal 79  ?01/13/22 1151 116/75 Sitting Left Arm Normal 75  ?01/13/22 1150 135/85 Supine Left Arm Normal 76  ? ? ?Physical Exam ?Vitals reviewed.  ?Constitutional:   ?   Appearance: She is obese.  ?   Comments: Short stature, mildly obese and in no acute distress.   ?Cardiovascular:  ?   Rate and Rhythm: Normal rate and regular rhythm.  ?    Pulses: Intact distal pulses.     ?     Radial pulses are 2+ on the right side and 2+ on the left side.  ?     Femoral pulses are 2+ on the right side and 2+ on the left side. ?     Popliteal pulses are 1+ on the right side and 1+ on the left side.  ?     Dorsalis pedis pulses are 2+ on the right side and 2+ on the left side.  ?     Posterior tibial pulses are 2+ on the right side and 2+ on the left side.  ?   Heart sounds: No murmur heard. ?  No gallop.  ?   Comments: No leg edema. No JVD.   ?Pulmonary:  ?   Effort: Pulmonary effort is normal. No accessory muscle usage or respiratory distress.  ?   Breath sounds: Normal breath sounds. No wheezing, rhonchi or rales.  ?Musculoskeletal:  ?   Right lower leg: No edema.  ?   Left lower leg: No edema.  ? ?Laboratory examination:  ? ?Recent Labs  ?  04/03/21 ?0149  ?NA 137  ?K 3.6  ?CL 103  ?CO2 23  ?GLUCOSE 186*  ?BUN 18  ?CREATININE 1.02*  ?CALCIUM 9.4  ?GFRNONAA >60  ? ?CrCl cannot be calculated (Patient's most recent lab result is older than the maximum 21 days allowed.).  ? ?  Latest Ref Rng & Units 04/03/2021  ?  1:49 AM 04/26/2020  ?  1:24 PM 02/23/2020  ? 11:41 AM  ?CMP  ?Glucose 70 - 99 mg/dL 186   158   156    ?BUN 8 - 23 mg/dL 18   25   20     ?Creatinine 0.44 - 1.00 mg/dL 1.02   0.74   0.88    ?Sodium 135 - 145 mmol/L 137   139   141    ?Potassium 3.5 - 5.1 mmol/L 3.6   3.8   4.9    ?Chloride 98 - 111 mmol/L 103   103   103    ?CO2 22 - 32 mmol/L 23   22   22     ?Calcium 8.9 - 10.3 mg/dL 9.4   10.1   10.3    ?Total Protein 6.5 - 8.1 g/dL 6.4      ?Total Bilirubin 0.3 - 1.2 mg/dL 0.8      ?Alkaline Phos 38 - 126 U/L 62      ?AST 15 - 41 U/L  25      ?ALT 0 - 44 U/L 30      ? ? ?  Latest Ref Rng & Units 04/03/2021  ?  1:49 AM 02/08/2020  ? 12:33 AM 08/04/2019  ? 12:34 PM  ?CBC  ?WBC 4.0 - 10.5 K/uL 5.3   6.0   5.5    ?Hemoglobin 12.0 - 15.0 g/dL 12.7   12.2   12.5    ?Hematocrit 36.0 - 46.0 % 38.2   37.0   37.9    ?Platelets 150 - 400 K/uL 172   175   169    ? ?Lipid  Panel  ?No results found for: CHOL, TRIG, HDL, CHOLHDL, VLDL, LDLCALC, LDLDIRECT ?HEMOGLOBIN A1C ?Lab Results  ?Component Value Date  ? HGBA1C 7.9 (H) 02/08/2020  ? MPG 180.03 02/08/2020  ? ?BNP (last 3 results) ?No results for input(s): BNP in the last 8760 hours. ? ?External labs:  ?08/04/2019: ?eGFR 60. ?WBC 5.5, RBC 4.2, H/H 37.9/12.5, PLT 169.  ?TSH 2.596 09/25/2018 ? ?Labs 06/12/2019: Serum glucose 212 mg, BUN 15, creatinine 0.77, eGFR >71m potassium 4.1, CMP otherwise normal.  Total cholesterol 182, triglycerides 426, HDL 28, LDL 85.  CBC normal, TSH normal. ? ?10/29/2018: ?AST 24.5, ALT 29, BUN 12, Creatinine 0.67, Sodium 137, Potassium 3.1, eGFR If NonAfricn Am >60 ? ?04/05/2018: ?Lipid profile: Cholesterol 196, HDL 29, Triglycerides 382, VLDL 76, LDL 90 ?Hemoglobin A1C 7.9.  ?TSH 2.28 ? ?Medications and allergies  ? ?Allergies  ?Allergen Reactions  ? Codeine Rash  ? Doxycycline Rash  ? Erythromycin Rash  ?  ? ?Current Outpatient Medications  ?Medication Instructions  ? ALPRAZolam (XANAX) 1 mg, Oral, 3 times daily PRN  ? aspirin 81 mg, Oral, Daily at bedtime  ? buPROPion (WELLBUTRIN XL) 150 mg, Oral, Every morning  ? Choline Fenofibrate (FENOFIBRIC ACID) 135 MG CPDR 1 capsule, Oral, Daily  ? citalopram (CELEXA) 40 mg, Oral, Daily  ? furosemide (LASIX) 20 MG tablet Twice daily as needed if weight >3 Lbs  ? gabapentin (NEURONTIN) 400 mg, Oral, 2 times daily  ? glimepiride (AMARYL) 4 mg, Oral, Daily  ? metoprolol succinate (TOPROL-XL) 50 mg, Oral, Daily, Take with a meal.  ? omeprazole (PRILOSEC) 20 mg, Oral, Daily at bedtime  ? potassium chloride SA (KLOR-CON) 20 MEQ tablet 20 mEq, Oral, Daily PRN, Only if furosemide taken.  ? Semaglutide,0.25 or 0.5MG/DOS, 2 MG/1.5ML SOPN 2.5 mLs, Subcutaneous, Every Wed, OZEMPIC  ? spironolactone (ALDACTONE) 50 MG tablet TAKE 1 TABLET BY MOUTH EVERY DAY IN THE MORNING  ? verapamil (CALAN-SR) 120 mg, Oral, Daily at bedtime  ? ?Radiology:  ? ?CT abdomen 12/28/2017: ?1. A 4 mm  left ureteral stone at the pelvic brim causing moderate ?left-sided hydronephrosis and hydroureter. ?2. Colonic diverticulosis without acute diverticulitis. ?3. Hepatic steatosis. Slightly lobular liver s

## 2022-01-18 ENCOUNTER — Ambulatory Visit: Payer: Medicare Other | Admitting: Gastroenterology

## 2022-01-18 DIAGNOSIS — R69 Illness, unspecified: Secondary | ICD-10-CM | POA: Diagnosis not present

## 2022-01-18 DIAGNOSIS — F330 Major depressive disorder, recurrent, mild: Secondary | ICD-10-CM | POA: Diagnosis not present

## 2022-01-25 ENCOUNTER — Ambulatory Visit: Payer: Medicare HMO

## 2022-01-25 DIAGNOSIS — R0609 Other forms of dyspnea: Secondary | ICD-10-CM | POA: Diagnosis not present

## 2022-01-25 DIAGNOSIS — I5032 Chronic diastolic (congestive) heart failure: Secondary | ICD-10-CM | POA: Diagnosis not present

## 2022-01-26 ENCOUNTER — Ambulatory Visit: Payer: Medicare HMO

## 2022-01-26 DIAGNOSIS — R0609 Other forms of dyspnea: Secondary | ICD-10-CM | POA: Diagnosis not present

## 2022-01-26 DIAGNOSIS — I5032 Chronic diastolic (congestive) heart failure: Secondary | ICD-10-CM

## 2022-01-27 NOTE — Progress Notes (Signed)
Normal myocardial perfusion and overall low risk stress test

## 2022-01-27 NOTE — Progress Notes (Signed)
Called and spoke to pt, pt voiced understanding.

## 2022-02-07 DIAGNOSIS — F330 Major depressive disorder, recurrent, mild: Secondary | ICD-10-CM | POA: Diagnosis not present

## 2022-02-07 DIAGNOSIS — R69 Illness, unspecified: Secondary | ICD-10-CM | POA: Diagnosis not present

## 2022-02-08 ENCOUNTER — Encounter: Payer: Self-pay | Admitting: Gastroenterology

## 2022-02-08 ENCOUNTER — Ambulatory Visit (INDEPENDENT_AMBULATORY_CARE_PROVIDER_SITE_OTHER): Payer: 59 | Admitting: Gastroenterology

## 2022-02-08 ENCOUNTER — Other Ambulatory Visit (INDEPENDENT_AMBULATORY_CARE_PROVIDER_SITE_OTHER): Payer: Medicare HMO

## 2022-02-08 VITALS — BP 110/72 | HR 73 | Ht 60.0 in | Wt 160.0 lb

## 2022-02-08 DIAGNOSIS — K5909 Other constipation: Secondary | ICD-10-CM

## 2022-02-08 DIAGNOSIS — K648 Other hemorrhoids: Secondary | ICD-10-CM

## 2022-02-08 DIAGNOSIS — K644 Residual hemorrhoidal skin tags: Secondary | ICD-10-CM | POA: Diagnosis not present

## 2022-02-08 LAB — CBC
HCT: 39 % (ref 36.0–46.0)
Hemoglobin: 13 g/dL (ref 12.0–15.0)
MCHC: 33.3 g/dL (ref 30.0–36.0)
MCV: 90.5 fl (ref 78.0–100.0)
Platelets: 200 10*3/uL (ref 150.0–400.0)
RBC: 4.31 Mil/uL (ref 3.87–5.11)
RDW: 12.8 % (ref 11.5–15.5)
WBC: 5.8 10*3/uL (ref 4.0–10.5)

## 2022-02-08 MED ORDER — CITRUCEL PO POWD: 1.0000 | Freq: Every day | ORAL | Status: DC

## 2022-02-08 NOTE — Patient Instructions (Signed)
If you are age 62 or younger, your body mass index should be between 19-25. Your Body mass index is 31.25 kg/m. If this is out of the aformentioned range listed, please consider follow up with your Primary Care Provider.  ________________________________________________________  The [place] [facility] providers would like to encourage you to use [facility] to communicate with providers for non-urgent requests or questions.  Due to long hold times on the telephone, sending your provider a message by [facility] may be a faster and more efficient way to get a response.  Please allow 48 business hours for a response.  Please remember that this is for non-urgent requests.  _______________________________________________________  Your provider has requested that you go to the basement level for lab work before leaving today. Press "B" on the elevator. The lab is located at the first door on the left as you exit the elevator.  Due to recent changes in healthcare laws, you may see the results of your imaging and laboratory studies on MyChart before your provider has had a chance to review them.  We understand that in some cases there may be results that are confusing or concerning to you. Not all laboratory results come back in the same time frame and the provider may be waiting for multiple results in order to interpret others.  Please give [place] 48 hours in order for your provider to thoroughly review all the results before contacting the office for clarification of your results.   Please start taking citrucel (orange flavored) powder fiber supplement.  This may cause some bloating at first but that usually goes away. Begin with a small spoonful and work your way up to a large, heaping spoonful daily over a week.  You will need a follow up appointment in 3 months (August 2023).  We will contact you to schedule this appointment.  Thank you for entrusting me with your care and choosing [facility].  Dr Ardis Hughs

## 2022-02-08 NOTE — Progress Notes (Signed)
Review of pertinent gastrointestinal problems: 1. Precancerous colon polyps:  Colonoscopy Dr. Ardis Hughs 12/2014 found 5 polyps, 4 were precancerous.  04/2018 colonoscopy Dr. Ardis Hughs found a single subcentimeter adenoma.  Recommended recall at 5-year interval.  Also diverticulosis and medium sized internal and external hemorrhoids were noted. 2. Internal/external hemorrhoids: large external, small internal.  Was referred to [facility] 04/2015 who recommended topical therapies.   HPI: This is a very pleasant [name] [name] whom I last saw at the time of a colonoscopy 04/2018, almost 4 years ago.  See those results summarized above.  She had 3 months of nearly daily blood in stool.  Mostly bright red, somewhat bit darker.  That stopped a month ago.  She has struggled with constipation for much of her life.  She will periodically have to push and strain to move her bowels.  She will take a per DM laxative about 2-3 times year and this helps.  She takes it when she has not moved her bowels in for 5 days.  Her weight is overall stable.  She has no abdominal pains.  Her blood sugars have been quite difficult to control lately, her hemoglobin A1c most recently was 9.8.  Colon cancer does not run in her family   Old Data Reviewed:  Blood work July 2022 CBC was normal.   Review of systems: Pertinent positive and negative review of systems were noted in the above HPI section. All other review negative.   Past Medical History:  Diagnosis Date   Abnormal EKG    Allergy    Anxiety    PTSD   CHF (congestive heart failure) (HCC)    Depression    Depression    Diabetes mellitus without complication (HCC)    Dyspnea    GERD (gastroesophageal reflux disease)    Hyperlipidemia    Hypertension    Unspecified   IBS (irritable bowel syndrome)    Kidney stone    Panic disorder 03/20/2019   Seizures ([place])    Reports last seizure was 30 + years ago    Sleep apnea    no CPAP worn   Stroke [facility])      Past Surgical History:  Procedure Laterality Date   HYSTEROSCOPY  2001   submucous myomectomy   TOTAL ABDOMINAL HYSTERECTOMY  2002   TAH BSO  pathology showed leiomyoma, adenomyosis, simple and complex hyperplasia without atypia   TUMOR REMOVAL     from left ear    Current Outpatient Medications  Medication Instructions   ALPRAZolam (XANAX) 1 mg, Oral, 3 times daily PRN   aspirin 81 mg, Oral, Daily at bedtime   buPROPion (WELLBUTRIN XL) 150 mg, Oral, Every morning   Choline Fenofibrate (FENOFIBRIC ACID) 135 MG CPDR 1 capsule, Oral, Daily   citalopram (CELEXA) 40 mg, Oral, Daily   furosemide (LASIX) 20 MG tablet Twice daily as needed if weight >3 Lbs   gabapentin (NEURONTIN) 400 mg, Oral, 2 times daily   glimepiride (AMARYL) 4 mg, Oral, Daily   metoprolol succinate (TOPROL-XL) 50 mg, Oral, Daily, Take with a meal.   omeprazole (PRILOSEC) 20 mg, Oral, Daily at bedtime   potassium chloride SA (KLOR-CON) 20 MEQ tablet 20 mEq, Oral, Daily PRN, Only if furosemide taken.   Semaglutide,0.25 or 0.'5MG'$ /DOS, 2 MG/1.5ML SOPN 2.5 mLs, Subcutaneous, Every Wed, OZEMPIC   spironolactone (ALDACTONE) 50 MG tablet TAKE 1 TABLET BY MOUTH EVERY DAY IN THE MORNING   verapamil (CALAN-SR) 120 mg, Oral, Daily at bedtime    Allergies as  of 02/08/2022 - Review Complete 02/08/2022  Allergen Reaction Noted   Codeine Rash    Doxycycline Rash 07/13/2014   Erythromycin Rash 07/13/2014    Family History  Problem Relation Age of Onset   Uterine cancer Mother    Irritable bowel syndrome Mother    Stroke Mother    Osteoporosis Mother    Cancer Father        Prostate   High blood pressure Father    Heart attack Brother 80   Cancer Brother        Prostate   Heart disease Maternal Grandmother    Coronary artery disease Paternal Grandfather    Colitis Neg Hx    Esophageal cancer Neg Hx    Stomach cancer Neg Hx    Rectal cancer Neg Hx     Social History   Socioeconomic History   Marital status:  Married    Spouse name: Not on file   Number of children: 0   Years of education: Not on file   Highest education level: Some college, no degree  Occupational History   Occupation: Full Time    Comment: Sales  Tobacco Use   Smoking status: Never   Smokeless tobacco: Never  Vaping Use   Vaping Use: Never used  Substance and Sexual Activity   Alcohol use: No    Alcohol/week: 0.0 standard drinks   Drug use: No   Sexual activity: Not Currently    Comment: 1st intercourse 61 yo-Fewer than 5 partners  Other Topics Concern   Not on file  Social History Narrative   Right handed   Caffeine 1-2 cups daily    Lives at home with husband with 2 dogs    Regular exercise: No   Social Determinants of Radio broadcast assistant Strain: Not on file  Food Insecurity: Not on file  Transportation Needs: Not on file  Physical Activity: Not on file  Stress: Not on file  Social Connections: Not on file  Intimate Partner Violence: Not on file     Physical Exam: Ht 5' (1.524 m)   Wt 160 lb (72.6 kg)   BMI 31.25 kg/m  Constitutional: generally well-appearing Psychiatric: alert and oriented x3 Eyes: extraocular movements intact Mouth: oral pharynx moist, no lesions Neck: supple no lymphadenopathy Cardiovascular: heart regular rate and rhythm Lungs: clear to auscultation bilaterally Abdomen: soft, nontender, nondistended, no obvious ascites, no peritoneal signs, normal bowel sounds Extremities: no lower extremity edema bilaterally Skin: no lesions on visible extremities Rectal examination with female assistant in the room: Deflated external hemorrhoid tissue, abundant.  Also obvious internal hemorrhoids noted on rectal examination.  Stool was brown and not checked for Hemoccult, no distal rectal masses.  Assessment and plan: 61 y.o. female with mild chronic constipation, chronic internal/external hemorrhoids  We had a nice discussion about hemorrhoidal disease.  I recommended bulking  her stool with fiber supplements to see if this can help keep her from having to push and strain periodically.  She will get a CBC today as well.  I offered referral to one of my partners to consider internal hemorrhoid banding but she wants to defer on that now.  She will return to see me in 3 months and sooner if needed.  I do not think she needs colonoscopy any sooner than she would have otherwise for her personal history of polyps which is of note a little over a year from now.    Please see the "Patient Instructions" section for addition  details about the plan.   Owens Loffler, MD [facility] 02/08/2022, 10:40 AM  Cc: Glenda Chroman, MD  Total time on date of encounter was 45  minutes (this included time spent preparing to see the patient reviewing records; obtaining and/or reviewing separately obtained history; performing a medically appropriate exam and/or evaluation; counseling and educating the patient and family if present; ordering medications, tests or procedures if applicable; and documenting clinical information in the health record).

## 2022-03-01 DIAGNOSIS — F330 Major depressive disorder, recurrent, mild: Secondary | ICD-10-CM | POA: Diagnosis not present

## 2022-03-01 DIAGNOSIS — R69 Illness, unspecified: Secondary | ICD-10-CM | POA: Diagnosis not present

## 2022-03-13 DIAGNOSIS — M25561 Pain in right knee: Secondary | ICD-10-CM | POA: Diagnosis not present

## 2022-03-29 DIAGNOSIS — R69 Illness, unspecified: Secondary | ICD-10-CM | POA: Diagnosis not present

## 2022-03-29 DIAGNOSIS — F330 Major depressive disorder, recurrent, mild: Secondary | ICD-10-CM | POA: Diagnosis not present

## 2022-04-05 DIAGNOSIS — F410 Panic disorder [episodic paroxysmal anxiety] without agoraphobia: Secondary | ICD-10-CM | POA: Diagnosis not present

## 2022-04-05 DIAGNOSIS — R69 Illness, unspecified: Secondary | ICD-10-CM | POA: Diagnosis not present

## 2022-04-05 DIAGNOSIS — F3341 Major depressive disorder, recurrent, in partial remission: Secondary | ICD-10-CM | POA: Diagnosis not present

## 2022-04-05 DIAGNOSIS — F4312 Post-traumatic stress disorder, chronic: Secondary | ICD-10-CM | POA: Diagnosis not present

## 2022-04-20 ENCOUNTER — Encounter (HOSPITAL_COMMUNITY): Payer: Self-pay

## 2022-04-20 ENCOUNTER — Other Ambulatory Visit: Payer: Self-pay

## 2022-04-20 ENCOUNTER — Emergency Department (HOSPITAL_COMMUNITY)
Admission: EM | Admit: 2022-04-20 | Discharge: 2022-04-20 | Disposition: A | Discharge status: Home / Self Care | Payer: 59 | Attending: Emergency Medicine | Admitting: Emergency Medicine

## 2022-04-20 DIAGNOSIS — R739 Hyperglycemia, unspecified: Secondary | ICD-10-CM | POA: Insufficient documentation

## 2022-04-20 DIAGNOSIS — Z794 Long term (current) use of insulin: Secondary | ICD-10-CM | POA: Insufficient documentation

## 2022-04-20 DIAGNOSIS — Z7982 Long term (current) use of aspirin: Secondary | ICD-10-CM | POA: Insufficient documentation

## 2022-04-20 DIAGNOSIS — R61 Generalized hyperhidrosis: Secondary | ICD-10-CM | POA: Insufficient documentation

## 2022-04-20 DIAGNOSIS — E1165 Type 2 diabetes mellitus with hyperglycemia: Secondary | ICD-10-CM | POA: Diagnosis not present

## 2022-04-20 LAB — I-STAT CHEM 8, ED
BUN: 21 mg/dL (ref 8–23)
Calcium, Ion: 1.21 mmol/L (ref 1.15–1.40)
Chloride: 101 mmol/L (ref 98–111)
Creatinine, Ser: 1 mg/dL (ref 0.44–1.00)
Glucose, Bld: 244 mg/dL — ABNORMAL HIGH (ref 70–99)
HCT: 42 % (ref 36.0–46.0)
Hemoglobin: 14.3 g/dL (ref 12.0–15.0)
Potassium: 5 mmol/L (ref 3.5–5.1)
Sodium: 136 mmol/L (ref 135–145)
TCO2: 24 mmol/L (ref 22–32)

## 2022-04-20 LAB — CBG MONITORING, ED: Glucose-Capillary: 237 mg/dL — ABNORMAL HIGH (ref 70–99)

## 2022-04-20 NOTE — ED Triage Notes (Addendum)
Pt presents with hyperglycemia of 253 after starting prednisone this week pt also started amoxicillin due to sinus infection. Pt states she is now sweaty.

## 2022-04-20 NOTE — ED Provider Notes (Signed)
[facility] Provider Note   CSN: [id number] Arrival date & time: 04/20/22  0112     History  Chief Complaint  Patient presents with   Hyperglycemia    [name] [name] is a 61 y.o. female.  Patient here concerned mostly for hyperglycemia.  She states that she was diagnosed with a sinus infection recently secondary to some tooth pain and some sinus pain similar to previous sinus infections.  She was started on amoxicillin and prednisone.  She started those medications today.  She stated that she was noticing some palpitations and she was little bit sweaty and she checked her blood sugar.  Was elevated.  She tried talking to her doctor a couple times but never got a return phone call.  Thus she presents here for further evaluation.  She denies any chest pain shortness of breath or any other symptoms.  She states she has had some sweatiness that started tonight as well.  States that she does take insulin and has not had any recent changes.   Hyperglycemia      Home Medications Prior to Admission medications   Medication Sig Start Date End Date Taking? Authorizing Provider  ALPRAZolam Duanne Moron) 1 MG tablet Take 1 mg by mouth 3 (three) times daily as needed.     [provider]  aspirin 81 MG tablet Take 81 mg by mouth at bedtime.     [provider]  buPROPion (WELLBUTRIN XL) 150 MG 24 hr tablet Take 150 mg by mouth every morning. 06/02/20   [provider]  Choline Fenofibrate (FENOFIBRIC ACID) 135 MG CPDR Take 1 capsule by mouth daily. 11/19/20   [provider]  citalopram (CELEXA) 40 MG tablet Take 40 mg by mouth daily.    [provider]  furosemide (LASIX) 20 MG tablet Twice daily as needed if weight >3 Lbs Patient taking differently: 40 mg. Twice daily as needed if weight >3 Lbs 05/29/19   Adrian Prows, MD  gabapentin (NEURONTIN) 400 MG capsule Take 400 mg by mouth 2 (two) times daily.    [provider]   glimepiride (AMARYL) 4 MG tablet Take 4 mg by mouth daily. 01/14/20   [provider]  insulin glargine (LANTUS) 100 UNIT/ML injection Inject 18 Units into the skin daily.    [provider]  methylcellulose (CITRUCEL) oral powder Take 1 packet by mouth daily. 02/08/22   Milus Banister, MD  metoprolol succinate (TOPROL-XL) 50 MG 24 hr tablet TAKE 1 TABLET (50 MG TOTAL) BY MOUTH DAILY. TAKE WITH A MEAL. 05/23/21   Adrian Prows, MD  omeprazole (PRILOSEC) 20 MG capsule Take 20 mg by mouth at bedtime.  07/02/14   [provider]  potassium chloride SA (KLOR-CON) 20 MEQ tablet Take 1 tablet (20 mEq total) by mouth daily as needed (With Furosemide). Only if furosemide taken. 02/11/20   Adrian Prows, MD  Semaglutide,0.25 or 0.'5MG'$ /DOS, 2 MG/1.5ML SOPN Inject 2.5 mLs into the skin every Wednesday. [place] 08/14/18   [provider]  spironolactone (ALDACTONE) 50 MG tablet TAKE 1 TABLET BY MOUTH EVERY DAY IN THE MORNING 11/11/21   Adrian Prows, MD  verapamil (CALAN-SR) 120 MG CR tablet Take 120 mg by mouth at bedtime.  07/02/14   [provider]      Allergies    Codeine, Doxycycline, and Erythromycin    Review of Systems   Review of Systems  Physical Exam Updated Vital Signs BP (!) 149/67   Pulse 68  Temp 97.6 F (36.4 C) (Oral)   Resp 18   Ht 5' (1.524 m)   Wt 68.9 kg   SpO2 95%   BMI 29.69 kg/m  Physical Exam Vitals and nursing note reviewed.  Constitutional:      Appearance: She is well-developed.  HENT:     Head: Normocephalic and atraumatic.     Mouth/Throat:     Mouth: Mucous membranes are moist.     Pharynx: Oropharynx is clear.  Eyes:     Pupils: Pupils are equal, round, and reactive to light.  Cardiovascular:     Rate and Rhythm: Normal rate and regular rhythm.  Pulmonary:     Effort: No respiratory distress.     Breath sounds: No stridor.  Abdominal:     General: Abdomen is flat. There is no distension.  Musculoskeletal:     Cervical  back: Normal range of motion.  Skin:    General: Skin is warm.     Comments: Slight diaphoresis mostly around her head and neck  Neurological:     General: No focal deficit present.     Mental Status: She is alert.     ED Results / Procedures / Treatments   Labs (all labs ordered are listed, but only abnormal results are displayed) Labs Reviewed  CBG MONITORING, ED - Abnormal; Notable for the following components:      Result Value   Glucose-Capillary 237 (*)    All other components within normal limits  I-STAT CHEM 8, ED - Abnormal; Notable for the following components:   Glucose, Bld 244 (*)    All other components within normal limits    EKG None  Radiology No results found.  Procedures Procedures    Medications Ordered in ED Medications - No data to display  ED Course/ Medical Decision Making/ A&P                           Medical Decision Making Amount and/or Complexity of Data Reviewed ECG/medicine tests: ordered.   Overall I suspect that her symptoms are probably related to the prednisone she is on versus related to her illness as well.  Blood sugar slightly high here but no evidence of DKA.  The diaphoresis does worry me a little bit but she does not have a fever she has no chest pain her EKG is reassuring.  I suspect this is probably all related to the prednisone.  I did advise her to stop it as it was not really necessary for sinus infection anyway.  She will will follow-up with her doctor tomorrow for repeat evaluation.  If any new symptoms arise she will present here for further evaluation.  Final Clinical Impression(s) / ED Diagnoses Final diagnoses:  Hyperglycemia    Rx / DC Orders ED Discharge Orders     None         Azya Barbero, Corene Cornea, MD 04/20/22 0505

## 2022-04-27 DIAGNOSIS — R69 Illness, unspecified: Secondary | ICD-10-CM | POA: Diagnosis not present

## 2022-04-27 DIAGNOSIS — F330 Major depressive disorder, recurrent, mild: Secondary | ICD-10-CM | POA: Diagnosis not present

## 2022-05-09 ENCOUNTER — Ambulatory Visit: Payer: Medicare HMO | Admitting: Gastroenterology

## 2022-06-02 DIAGNOSIS — F330 Major depressive disorder, recurrent, mild: Secondary | ICD-10-CM | POA: Diagnosis not present

## 2022-06-02 DIAGNOSIS — R69 Illness, unspecified: Secondary | ICD-10-CM | POA: Diagnosis not present

## 2022-06-08 ENCOUNTER — Other Ambulatory Visit: Payer: Self-pay

## 2022-06-08 DIAGNOSIS — I10 Essential (primary) hypertension: Secondary | ICD-10-CM

## 2022-06-08 DIAGNOSIS — I5032 Chronic diastolic (congestive) heart failure: Secondary | ICD-10-CM

## 2022-06-08 MED ORDER — SPIRONOLACTONE 50 MG PO TABS: ORAL_TABLET | ORAL | 1 refills | Status: DC

## 2022-06-15 DIAGNOSIS — M1712 Unilateral primary osteoarthritis, left knee: Secondary | ICD-10-CM | POA: Diagnosis not present

## 2022-06-15 DIAGNOSIS — M25562 Pain in left knee: Secondary | ICD-10-CM | POA: Diagnosis not present

## 2022-06-22 ENCOUNTER — Other Ambulatory Visit: Payer: Self-pay | Admitting: Cardiology

## 2022-06-22 DIAGNOSIS — I10 Essential (primary) hypertension: Secondary | ICD-10-CM

## 2022-06-22 DIAGNOSIS — I5032 Chronic diastolic (congestive) heart failure: Secondary | ICD-10-CM

## 2022-07-10 DIAGNOSIS — R69 Illness, unspecified: Secondary | ICD-10-CM | POA: Diagnosis not present

## 2022-07-10 DIAGNOSIS — F330 Major depressive disorder, recurrent, mild: Secondary | ICD-10-CM | POA: Diagnosis not present

## 2022-07-11 DIAGNOSIS — F419 Anxiety disorder, unspecified: Secondary | ICD-10-CM | POA: Diagnosis not present

## 2022-07-11 DIAGNOSIS — I10 Essential (primary) hypertension: Secondary | ICD-10-CM | POA: Diagnosis not present

## 2022-07-17 NOTE — Progress Notes (Signed)
[name] Prows MD Reason for referral-congestive heart failure  HPI: 61 year old female for evaluation of congestive heart failure at request of [name] MD.  Previously followed by Dr. Nadyne Coombes.  Patient apparently had cardiac catheterization in 2005 showing normal coronary arteries.  Carotid Dopplers May 2020 showed 1 to 39% bilateral stenosis.  Nuclear study May 2023 showed ejection fraction 69% and normal perfusion.  Echocardiogram May 2023 showed normal LV function and grade 1 diastolic dysfunction.  Patient also carries a history of chronic diastolic congestive heart failure and cardiology asked to evaluate.  Patient has some dyspnea on exertion but no orthopnea, PND, exertional chest pain or syncope.  Occasional minimal pedal edema but none in the past 2 weeks.  No palpitations or syncope.  Current Outpatient Medications  Medication Sig Dispense Refill   ALPRAZolam (XANAX) 1 MG tablet Take 1 mg by mouth 3 (three) times daily as needed.      aspirin 81 MG tablet Take 81 mg by mouth at bedtime.      atorvastatin (LIPITOR) 20 MG tablet Take 20 mg by mouth daily.     buPROPion (WELLBUTRIN XL) 150 MG 24 hr tablet Take 150 mg by mouth every morning.     Choline Fenofibrate (FENOFIBRIC ACID) 135 MG CPDR Take 1 capsule by mouth daily.     citalopram (CELEXA) 40 MG tablet Take 40 mg by mouth daily.     furosemide (LASIX) 20 MG tablet Twice daily as needed if weight >3 Lbs (Patient taking differently: 40 mg. Twice daily as needed if weight >3 Lbs) 180 tablet 0   gabapentin (NEURONTIN) 400 MG capsule Take 400 mg by mouth 2 (two) times daily.     glimepiride (AMARYL) 4 MG tablet Take 4 mg by mouth daily.     insulin glargine (LANTUS) 100 UNIT/ML injection Inject 18 Units into the skin daily.     lidocaine (LIDODERM) 5 % Place 1 patch onto the skin daily.     lidocaine (XYLOCAINE) 5 % ointment Apply 1 Application topically 4 (four) times daily as needed.     metoprolol succinate (TOPROL-XL)  50 MG 24 hr tablet TAKE 1 TABLET (50 MG TOTAL) BY MOUTH DAILY. TAKE WITH A MEAL. 90 tablet 3   omeprazole (PRILOSEC) 20 MG capsule Take 20 mg by mouth at bedtime.   9   Semaglutide,0.25 or 0.'5MG'$ /DOS, 2 MG/1.5ML SOPN Inject 2.5 mLs into the skin every Wednesday. OZEMPIC     spironolactone (ALDACTONE) 50 MG tablet TAKE 1 TABLET BY MOUTH EVERY DAY IN THE MORNING 90 tablet 1   verapamil (CALAN-SR) 120 MG CR tablet Take 120 mg by mouth at bedtime.   11   methylcellulose (CITRUCEL) oral powder Take 1 packet by mouth daily. (Patient not taking: Reported on 07/31/2022)     potassium chloride SA (KLOR-CON) 20 MEQ tablet Take 1 tablet (20 mEq total) by mouth daily as needed (With Furosemide). Only if furosemide taken. (Patient not taking: Reported on 07/31/2022) 180 tablet 1   promethazine (PHENERGAN) 12.5 MG tablet Take 12.5 mg by mouth every 8 (eight) hours as needed. (Patient not taking: Reported on 07/31/2022)     No current facility-administered medications for this visit.    Allergies  Allergen Reactions   Prednisone    Codeine Rash   Doxycycline Rash   Erythromycin Rash     Past Medical History:  Diagnosis Date   Abnormal EKG    Allergy    Anxiety    PTSD  CHF (congestive heart failure) (HCC)    Depression    Diabetes mellitus without complication (HCC)    GERD (gastroesophageal reflux disease)    Hyperlipidemia    Hypertension    Unspecified   IBS (irritable bowel syndrome)    Kidney stone    Panic disorder 03/20/2019   Seizures ([place])    Reports last seizure was 30 + years ago    Sleep apnea    no CPAP worn   Stroke [facility])     Past Surgical History:  Procedure Laterality Date   HYSTEROSCOPY  2001   submucous myomectomy   TOTAL ABDOMINAL HYSTERECTOMY  2002   TAH BSO  pathology showed leiomyoma, adenomyosis, simple and complex hyperplasia without atypia   TUMOR REMOVAL     from left ear    Social History   Socioeconomic History   Marital status: Married    Spouse  name: Not on file   Number of children: 0   Years of education: Not on file   Highest education level: Some college, no degree  Occupational History   Occupation: Full Time    Comment: Sales  Tobacco Use   Smoking status: Never   Smokeless tobacco: Never  Vaping Use   Vaping Use: Never used  Substance and Sexual Activity   Alcohol use: No    Alcohol/week: 0.0 standard drinks of alcohol   Drug use: No   Sexual activity: Not Currently    Comment: 1st intercourse 61 yo-Fewer than 5 partners  Other Topics Concern   Not on file  Social History Narrative   Right handed   Caffeine 1-2 cups daily    Lives at home with husband with 2 dogs    Regular exercise: No   Social Determinants of Radio broadcast assistant Strain: Not on file  Food Insecurity: Not on file  Transportation Needs: Not on file  Physical Activity: Not on file  Stress: Not on file  Social Connections: Not on file  Intimate Partner Violence: Not on file    Family History  Problem Relation Age of Onset   Uterine cancer Mother    Irritable bowel syndrome Mother    Stroke Mother    Osteoporosis Mother    Cancer Father        Prostate   High blood pressure Father    Heart attack Brother 69   Cancer Brother        Prostate   Heart disease Maternal Grandmother    Coronary artery disease Paternal Grandfather    Colitis Neg Hx    Esophageal cancer Neg Hx    Stomach cancer Neg Hx    Rectal cancer Neg Hx     ROS: Bilateral knee pain but no fevers or chills, productive cough, hemoptysis, dysphasia, odynophagia, melena, hematochezia, dysuria, hematuria, rash, seizure activity, orthopnea, PND, pedal edema, claudication. Remaining systems are negative.  Physical Exam:   Blood pressure 138/72, pulse 73, height 5' (1.524 m), weight 165 lb 9.6 oz (75.1 kg), SpO2 96 %.  General:  Well developed/well nourished in NAD Skin warm/dry Patient not depressed No peripheral clubbing Back-normal HEENT-normal/normal  eyelids Neck supple/normal carotid upstroke bilaterally; no bruits; no JVD; no thyromegaly chest - CTA/ normal expansion CV - RRR/normal S1 and S2; no murmurs, rubs or gallops;  PMI nondisplaced Abdomen -NT/ND, no HSM, no mass, + bowel sounds, no bruit 2+ femoral pulses, no bruits Ext-no edema, chords, 2+ DP Neuro-grossly nonfocal  ECG -normal sinus rhythm at a rate of  73, lateral T wave inversion.  Mildly prolonged QT interval.  Personally reviewed  A/P  1 chronic diastolic congestive heart failure-she appears to be euvolemic on examination today.  Continue spironolactone.  She will take Lasix as needed for weight gain of 2 to 3 pounds in 1 day, lower extremity edema or worsening dyspnea.  Check potassium and renal function.  We discussed fluid restriction and low-sodium diet.  2 hypertension-blood pressure is reasonable.  Continue present medications and follow.  3 hyperlipidemia-continue statin.  4 history of sleep apnea-patient snores by her report.  We will arrange a sleep study to further assess.  May need CPAP.   Kirk Ruths, MD

## 2022-07-18 DIAGNOSIS — R69 Illness, unspecified: Secondary | ICD-10-CM | POA: Diagnosis not present

## 2022-07-18 DIAGNOSIS — Z0000 Encounter for general adult medical examination without abnormal findings: Secondary | ICD-10-CM | POA: Diagnosis not present

## 2022-07-18 DIAGNOSIS — Z79899 Other long term (current) drug therapy: Secondary | ICD-10-CM | POA: Diagnosis not present

## 2022-07-18 DIAGNOSIS — E7800 Pure hypercholesterolemia, unspecified: Secondary | ICD-10-CM | POA: Diagnosis not present

## 2022-07-24 DIAGNOSIS — E119 Type 2 diabetes mellitus without complications: Secondary | ICD-10-CM | POA: Diagnosis not present

## 2022-07-31 ENCOUNTER — Ambulatory Visit: Payer: Medicare HMO | Attending: Cardiology | Admitting: Cardiology

## 2022-07-31 ENCOUNTER — Encounter: Payer: Self-pay | Admitting: Cardiology

## 2022-07-31 ENCOUNTER — Telehealth: Payer: Self-pay | Admitting: *Deleted

## 2022-07-31 VITALS — BP 138/72 | HR 73 | Ht 60.0 in | Wt 165.6 lb

## 2022-07-31 DIAGNOSIS — E7800 Pure hypercholesterolemia, unspecified: Secondary | ICD-10-CM | POA: Diagnosis not present

## 2022-07-31 DIAGNOSIS — I5032 Chronic diastolic (congestive) heart failure: Secondary | ICD-10-CM | POA: Diagnosis not present

## 2022-07-31 DIAGNOSIS — R0683 Snoring: Secondary | ICD-10-CM

## 2022-07-31 DIAGNOSIS — I10 Essential (primary) hypertension: Secondary | ICD-10-CM | POA: Diagnosis not present

## 2022-07-31 NOTE — Patient Instructions (Signed)
Testing/Procedures:  WatchPAT?  Is a FDA cleared portable home sleep study test that uses a watch and 3 points of contact to monitor 7 different channels, including your heart rate, oxygen saturations, body position, snoring, and chest motion.  The study is easy to use from the comfort of your own home and accurately detect sleep apnea.  Before bed, you attach the chest sensor, attached the sleep apnea bracelet to your nondominant hand, and attach the finger probe.  After the study, the raw data is downloaded from the watch and scored for apnea events.   For more information: [email or web address]  Patient Testing Instructions:  Do not put battery into the device until bedtime when you are ready to begin the test. Please call the support number if you need assistance after following the instructions below: 24 hour support line- [phone] or ITAMAR support at [phone] (option 2)  Download the [employer] AmericanWatchPAT One" app through the google play store or App Store  Be sure to turn on or enable access to bluetooth in [place]lings on your smartphone/ device  Make sure no other bluetooth devices are on and within the vicinity of your smartphone/ device and WatchPAT watch during testing.  Make sure to leave your smart phone/ device plugged in and charging all night.  When ready for bed:  Follow the instructions step by step in the WatchPAT One App to activate the testing device. For additional instructions, including video instruction, visit the WatchPAT One video on Youtube. You can search for [place] One within Youtube (video is 4 minutes and 18 seconds) or enter: https://youtube/watch?v=BCce_vbiwxE Please note: You will be prompted to enter a Pin to connect via bluetooth when starting the test. The PIN will be assigned to you when you receive the test.  The device is disposable, but it recommended that you retain the device until you receive a call letting you know the study has  been received and the results have been interpreted.  We will let you know if the study did not transmit to [place] properly after the test is completed. You do not need to call us to confirm the receipt of the test.  Please complete the test within 48 hours of receiving PIN.   Frequently Asked Questions:  What is Watch [place] one?  A single use fully disposable home sleep apnea testing device and will not need to be returned after completion.  What are the requirements to use WatchPAT one?  The be able to have a successful watchpat one sleep study, you should have your Watch pat one device, your smart phone, watch pat one app, your PIN number and Internet access What type of phone do I need?  You should have a smart phone that uses Android 5.1 and above or any Iphone with IOS 10 and above How can I download the WatchPAT one app?  Based on your device type search for WatchPAT one app either in google play for android devices or APP store for Iphone's Where will I get my PIN for the study?  Your PIN will be provided by your physician's office. It is used for authentication and if you lose/forget your PIN, please reach out to your providers office.  I do not have Internet at home. Can I do WatchPAT one study?  WatchPAT One needs Internet connection throughout the night to be able to transmit the sleep data. You can use your home/local internet or your cellular's data package. However, it is always recommended to use home/local  Internet. It is estimated that between 20MB-30MB will be used with each study.However, the application will be looking for 80MB space in the phone to start the study.  What happens if I lose internet or bluetooth connection?  During the internet disconnection, your phone will not be able to transmit the sleep data. All the data, will be stored in your phone. As soon as the internet connection is back on, the phone will being sending the sleep data. During the bluetooth disconnection,  WatchPAT one will not be able to to send the sleep data to your phone. Data will be kept in the [facility] one until two devices have bluetooth connection back on. As soon as the connection is back on, WatchPAT one will send the sleep data to the phone.  How long do I need to wear the WatchPAT one?  After you start the study, you should wear the device at least 6 hours.  How far should I keep my phone from the device?  During the night, your phone should be within 15 feet.  What happens if I leave the room for restroom or other reasons?  Leaving the room for any reason will not cause any problem. As soon as your get back to the room, both devices will reconnect and will continue to send the sleep data. Can I use my phone during the sleep study?  Yes, you can use your phone as usual during the study. But it is recommended to put your watchpat one on when you are ready to go to bed.  How will I get my study results?  A soon as you completed your study, your sleep data will be sent to the provider. They will then share the results with you when they are ready.     Follow-Up: At [facility], you and your health needs are our priority.  As part of our continuing mission to provide you with exceptional heart care, we have created designated Provider Care Teams.  These Care Teams include your primary Cardiologist (physician) and Advanced Practice Providers (APPs -  Physician Assistants and Nurse Practitioners) who all work together to provide you with the care you need, when you need it.  We recommend signing up for the patient portal called "MyChart".  Sign up information is provided on this After Visit Summary.  MyChart is used to connect with patients for Virtual Visits (Telemedicine).  Patients are able to view lab/test results, encounter notes, upcoming appointments, etc.  Non-urgent messages can be sent to your provider as well.   To learn more about what you can do with MyChart, go to  [email or web address].    Your next appointment:   6 month(s)  The format for your next appointment:   In Person  Provider:   Kirk Ruths MD

## 2022-07-31 NOTE — Telephone Encounter (Signed)
Notified Stacy Green ok to activate itamar device.  

## 2022-08-01 ENCOUNTER — Telehealth: Payer: Self-pay | Admitting: Cardiology

## 2022-08-01 LAB — BASIC METABOLIC PANEL
BUN/Creatinine Ratio: 16 (ref 12–28)
BUN: 17 mg/dL (ref 8–27)
CO2: 22 mmol/L (ref 20–29)
Calcium: 9.7 mg/dL (ref 8.7–10.3)
Chloride: 102 mmol/L (ref 96–106)
Creatinine, Ser: 1.08 mg/dL — ABNORMAL HIGH (ref 0.57–1.00)
Glucose: 152 mg/dL — ABNORMAL HIGH (ref 70–99)
Potassium: 3.8 mmol/L (ref 3.5–5.2)
Sodium: 139 mmol/L (ref 134–144)
eGFR: 58 mL/min/{1.73_m2} — ABNORMAL LOW (ref >59)

## 2022-08-01 NOTE — Telephone Encounter (Signed)
Patient stated she was concerned her GFR is 58 and creatinine is 1.08. she wanted to know if she is headed for renal failure. She stated she is to have surgery in March and is worried. Please advise.

## 2022-08-01 NOTE — Telephone Encounter (Signed)
Spoke with pt, Aware of dr crenshaw's recommendations.  °

## 2022-08-01 NOTE — Telephone Encounter (Signed)
Patient calling in regards to lab results. She says it says her kidney function is low at 87  and wants to know if she needs to be seen.

## 2022-08-07 DIAGNOSIS — R69 Illness, unspecified: Secondary | ICD-10-CM | POA: Diagnosis not present

## 2022-08-07 DIAGNOSIS — F330 Major depressive disorder, recurrent, mild: Secondary | ICD-10-CM | POA: Diagnosis not present

## 2022-08-14 ENCOUNTER — Other Ambulatory Visit: Payer: Self-pay | Admitting: Cardiology

## 2022-08-14 DIAGNOSIS — I10 Essential (primary) hypertension: Secondary | ICD-10-CM

## 2022-08-29 ENCOUNTER — Encounter: Payer: Self-pay | Admitting: Cardiology

## 2022-08-29 DIAGNOSIS — H02825 Cysts of left lower eyelid: Secondary | ICD-10-CM | POA: Diagnosis not present

## 2022-08-29 DIAGNOSIS — H25093 Other age-related incipient cataract, bilateral: Secondary | ICD-10-CM | POA: Diagnosis not present

## 2022-09-05 ENCOUNTER — Emergency Department (HOSPITAL_COMMUNITY)
Admission: EM | Admit: 2022-09-05 | Discharge: 2022-09-06 | Discharge status: Against Medical Advice | Payer: 59 | Attending: Emergency Medicine | Admitting: Emergency Medicine

## 2022-09-05 DIAGNOSIS — R0602 Shortness of breath: Secondary | ICD-10-CM | POA: Insufficient documentation

## 2022-09-05 DIAGNOSIS — R079 Chest pain, unspecified: Secondary | ICD-10-CM | POA: Diagnosis not present

## 2022-09-05 DIAGNOSIS — R0789 Other chest pain: Secondary | ICD-10-CM | POA: Diagnosis present

## 2022-09-05 DIAGNOSIS — I509 Heart failure, unspecified: Secondary | ICD-10-CM | POA: Insufficient documentation

## 2022-09-05 DIAGNOSIS — J9811 Atelectasis: Secondary | ICD-10-CM | POA: Diagnosis not present

## 2022-09-05 DIAGNOSIS — Z5321 Procedure and treatment not carried out due to patient leaving prior to being seen by health care provider: Secondary | ICD-10-CM | POA: Insufficient documentation

## 2022-09-06 ENCOUNTER — Other Ambulatory Visit: Payer: Self-pay

## 2022-09-06 ENCOUNTER — Emergency Department (HOSPITAL_COMMUNITY): Payer: 59

## 2022-09-06 DIAGNOSIS — R079 Chest pain, unspecified: Secondary | ICD-10-CM | POA: Diagnosis not present

## 2022-09-06 DIAGNOSIS — J9811 Atelectasis: Secondary | ICD-10-CM | POA: Diagnosis not present

## 2022-09-06 DIAGNOSIS — R0602 Shortness of breath: Secondary | ICD-10-CM | POA: Diagnosis not present

## 2022-09-06 DIAGNOSIS — R0789 Other chest pain: Secondary | ICD-10-CM | POA: Diagnosis not present

## 2022-09-06 LAB — BASIC METABOLIC PANEL
Anion gap: 6 (ref 5–15)
BUN: 15 mg/dL (ref 8–23)
CO2: 25 mmol/L (ref 22–32)
Calcium: 9.1 mg/dL (ref 8.9–10.3)
Chloride: 104 mmol/L (ref 98–111)
Creatinine, Ser: 1.01 mg/dL — ABNORMAL HIGH (ref 0.44–1.00)
GFR, Estimated: >60 mL/min (ref >60)
Glucose, Bld: 254 mg/dL — ABNORMAL HIGH (ref 70–99)
Potassium: 3.4 mmol/L — ABNORMAL LOW (ref 3.5–5.1)
Sodium: 135 mmol/L (ref 135–145)

## 2022-09-06 LAB — CBC
HCT: 35.9 % — ABNORMAL LOW (ref 36.0–46.0)
Hemoglobin: 12.1 g/dL (ref 12.0–15.0)
MCH: 31.1 pg (ref 26.0–34.0)
MCHC: 33.7 g/dL (ref 30.0–36.0)
MCV: 92.3 fL (ref 80.0–100.0)
Platelets: 171 10*3/uL (ref 150–400)
RBC: 3.89 MIL/uL (ref 3.87–5.11)
RDW: 12.5 % (ref 11.5–15.5)
WBC: 5.4 10*3/uL (ref 4.0–10.5)
nRBC: 0 % (ref 0.0–0.2)

## 2022-09-06 LAB — TROPONIN I (HIGH SENSITIVITY): Troponin I (High Sensitivity): 5 ng/L (ref <18)

## 2022-09-06 NOTE — ED Triage Notes (Signed)
Pt c/o SOB and CP, states the chest pain is intermittent and primarily when she coughs. Has been SOB for approx. 3 days, reports she has CHF but hasn't taken her lasix in 3 weeks as she only takes it as needed.

## 2022-09-18 DIAGNOSIS — R69 Illness, unspecified: Secondary | ICD-10-CM | POA: Diagnosis not present

## 2022-09-18 DIAGNOSIS — F330 Major depressive disorder, recurrent, mild: Secondary | ICD-10-CM | POA: Diagnosis not present

## 2022-10-02 DIAGNOSIS — Z79899 Other long term (current) drug therapy: Secondary | ICD-10-CM | POA: Diagnosis not present

## 2022-10-02 DIAGNOSIS — F3341 Major depressive disorder, recurrent, in partial remission: Secondary | ICD-10-CM | POA: Diagnosis not present

## 2022-10-02 DIAGNOSIS — F410 Panic disorder [episodic paroxysmal anxiety] without agoraphobia: Secondary | ICD-10-CM | POA: Diagnosis not present

## 2022-10-02 DIAGNOSIS — F4312 Post-traumatic stress disorder, chronic: Secondary | ICD-10-CM | POA: Diagnosis not present

## 2022-10-02 DIAGNOSIS — R69 Illness, unspecified: Secondary | ICD-10-CM | POA: Diagnosis not present

## 2022-10-12 DIAGNOSIS — F330 Major depressive disorder, recurrent, mild: Secondary | ICD-10-CM | POA: Diagnosis not present

## 2022-10-12 DIAGNOSIS — R69 Illness, unspecified: Secondary | ICD-10-CM | POA: Diagnosis not present

## 2022-10-23 NOTE — H&P (Signed)
TOTAL KNEE ADMISSION H&P  Patient is being admitted for left total knee arthroplasty.  Subjective:  Chief Complaint: Left knee pain.  HPI: [name] [name], 62 y.o. female has a history of pain and functional disability in the left knee due to arthritis and has failed non-surgical conservative treatments for greater than 12 weeks to include NSAID's and/or analgesics, corticosteriod injections, viscosupplementation injections, and activity modification. Onset of symptoms was gradual, starting several years ago with gradually worsening course since that time. The patient noted no past surgery on the left knee.  Patient currently rates pain in the left knee at 7 out of 10 with activity. Patient has night pain, worsening of pain with activity and weight bearing, pain that interferes with activities of daily living, and pain with passive range of motion. Patient has evidence of  bone-on-bone arthritis in the medial and patellofemoral compartments of the left knee  by imaging studies. There is no active infection.  Patient Active Problem List   Diagnosis Date Noted   AKI (acute kidney injury) ([place])    Chronic diastolic heart failure ([place])    Class 1 obesity due to excess calories with body mass index (BMI) of 31.0 to 31.9 in adult    Type 2 diabetes mellitus with diabetic neuropathy, without long-term current use of insulin (HCC)    Gastroesophageal reflux disease    Hyperkalemia 02/08/2020   Panic disorder 03/20/2019   External hemorrhoids 12/08/2014   Rectal bleeding 11/22/2014   Hemorrhoids, internal 11/22/2014   Essential hypertension 06/08/2009   DYSPNEA 06/08/2009   ABNORMAL EKG 06/08/2009    Past Medical History:  Diagnosis Date   Abnormal EKG    Allergy    Anxiety    PTSD   CHF (congestive heart failure) ([place])    Depression    Diabetes mellitus without complication (HCC)    GERD (gastroesophageal reflux disease)    Hyperlipidemia    Hypertension    Unspecified   IBS (irritable  bowel syndrome)    Kidney stone    Panic disorder 03/20/2019   Seizures ([place])    Reports last seizure was 30 + years ago    Sleep apnea    no CPAP worn   Stroke [facility])     Past Surgical History:  Procedure Laterality Date   HYSTEROSCOPY  2001   submucous myomectomy   TOTAL ABDOMINAL HYSTERECTOMY  2002   TAH BSO  pathology showed leiomyoma, adenomyosis, simple and complex hyperplasia without atypia   TUMOR REMOVAL     from left ear    Prior to Admission medications   Medication Sig Start Date End Date Taking? Authorizing Provider  ALPRAZolam Duanne Moron) 1 MG tablet Take 1 mg by mouth 3 (three) times daily as needed.     [provider]  aspirin 81 MG tablet Take 81 mg by mouth at bedtime.     [provider]  atorvastatin (LIPITOR) 20 MG tablet Take 20 mg by mouth daily.    [provider]  buPROPion (WELLBUTRIN XL) 150 MG 24 hr tablet Take 150 mg by mouth every morning. 06/02/20   [provider]  Choline Fenofibrate (FENOFIBRIC ACID) 135 MG CPDR Take 1 capsule by mouth daily. 11/19/20   [provider]  citalopram (CELEXA) 40 MG tablet Take 40 mg by mouth daily.    [provider]  furosemide (LASIX) 20 MG tablet Twice daily as needed if weight >3 Lbs Patient taking differently: 40 mg. Twice daily as needed if weight >3  Lbs 05/29/19   Adrian Prows, MD  gabapentin (NEURONTIN) 400 MG capsule Take 400 mg by mouth 2 (two) times daily.    [provider]  glimepiride (AMARYL) 4 MG tablet Take 4 mg by mouth daily. 01/14/20   [provider]  insulin glargine (LANTUS) 100 UNIT/ML injection Inject 18 Units into the skin daily.    [provider]  lidocaine (LIDODERM) 5 % Place 1 patch onto the skin daily. 06/26/22   [provider]  lidocaine (XYLOCAINE) 5 % ointment Apply 1 Application topically 4 (four) times daily as needed.    [provider]  methylcellulose (CITRUCEL) oral powder Take 1 packet by  mouth daily. Patient not taking: Reported on 07/31/2022 02/08/22   Milus Banister, MD  metoprolol succinate (TOPROL-XL) 50 MG 24 hr tablet TAKE ONE TABLET BY MOUTH EVERY DAY WITH A MEAL 08/14/22   Adrian Prows, MD  omeprazole (PRILOSEC) 20 MG capsule Take 20 mg by mouth at bedtime.  07/02/14   [provider]  potassium chloride SA (KLOR-CON) 20 MEQ tablet Take 1 tablet (20 mEq total) by mouth daily as needed (With Furosemide). Only if furosemide taken. Patient not taking: Reported on 07/31/2022 02/11/20   Adrian Prows, MD  promethazine (PHENERGAN) 12.5 MG tablet Take 12.5 mg by mouth every 8 (eight) hours as needed. Patient not taking: Reported on 07/31/2022    [provider]  Semaglutide,0.25 or 0.5MG/DOS, 2 MG/1.5ML SOPN Inject 2.5 mLs into the skin every Wednesday. [place] 08/14/18   [provider]  spironolactone (ALDACTONE) 50 MG tablet TAKE 1 TABLET BY MOUTH EVERY DAY IN THE MORNING 06/22/22   Adrian Prows, MD  verapamil (CALAN-SR) 120 MG CR tablet Take 120 mg by mouth at bedtime.  07/02/14   [provider]    Allergies  Allergen Reactions   Prednisone    Codeine Rash   Doxycycline Rash   Erythromycin Rash    Social History   Socioeconomic History   Marital status: Married    Spouse name: Not on file   Number of children: 0   Years of education: Not on file   Highest education level: Some college, no degree  Occupational History   Occupation: Full Time    Comment: Sales  Tobacco Use   Smoking status: Never   Smokeless tobacco: Never  Vaping Use   Vaping Use: Never used  Substance and Sexual Activity   Alcohol use: No    Alcohol/week: 0.0 standard drinks of alcohol   Drug use: No   Sexual activity: Not Currently    Comment: 1st intercourse 62 yo-Fewer than 5 partners  Other Topics Concern   Not on file  Social History Narrative   Right handed   Caffeine 1-2 cups daily    Lives at home with husband with 2 dogs    Regular exercise: No    Social Determinants of Radio broadcast assistant Strain: Not on file  Food Insecurity: Not on file  Transportation Needs: Not on file  Physical Activity: Not on file  Stress: Not on file  Social Connections: Not on file  Intimate Partner Violence: Not on file    Tobacco Use: Low Risk  (07/31/2022)   Patient History    Smoking Tobacco Use: Never    Smokeless Tobacco Use: Never    Passive Exposure: Not on file   Social History   Substance and Sexual Activity  Alcohol Use No   Alcohol/week: 0.0 standard drinks of alcohol  Family History  Problem Relation Age of Onset   Uterine cancer Mother    Irritable bowel syndrome Mother    Stroke Mother    Osteoporosis Mother    Cancer Father        Prostate   High blood pressure Father    Heart attack Brother 59   Cancer Brother        Prostate   Heart disease Maternal Grandmother    Coronary artery disease Paternal Grandfather    Colitis Neg Hx    Esophageal cancer Neg Hx    Stomach cancer Neg Hx    Rectal cancer Neg Hx     Review of Systems  Constitutional:  Negative for chills and fever.  HENT: Negative.    Eyes: Negative.   Respiratory:  Negative for cough and shortness of breath.   Cardiovascular:  Negative for chest pain and palpitations.  Gastrointestinal:  Negative for abdominal pain, constipation, diarrhea, nausea and vomiting.  Genitourinary:  Negative for dysuria, frequency and urgency.  Musculoskeletal:  Positive for joint pain.  Skin:  Negative for rash.   Objective:  Physical Exam: Well nourished and well developed.  General: Alert and oriented x3, cooperative and pleasant, no acute distress.  Head: normocephalic, atraumatic, neck supple.  Eyes: EOMI. Abdomen: non-tender to palpation and soft, normoactive bowel sounds. Musculoskeletal: The patient has an antalgic gait pattern favoring the left side without the use of assistive devices.  Left Hip Exam: The range of motion: normal without  discomfort.  Right Knee Exam: No effusion present. No swelling present. The range of motion is: 0 to 125 degrees. No crepitus on range of motion of the knee. Positive medial joint line tenderness. No lateral joint line tenderness. The knee is stable.  Left Knee Exam: No effusion present. No swelling present. The Range of motion is: 5 to 125 degrees. Moderate crepitus on range of motion of the knee. Positive medial joint line tenderness. No lateral joint line tenderness. The knee is stable.  Calves soft and nontender. Motor function intact in LE. Strength 5/5 LE bilaterally. Neuro: Distal pulses 2+. Sensation to light touch intact in LE.  Vital signs in last 24 hours: BP: ()/()  Arterial Line BP: ()/()   Imaging Review Plain radiographs demonstrate moderate degenerative joint disease of the left knee. The overall alignment is neutral. The bone quality appears to be adequate for age and reported activity level.  Assessment/Plan:  End stage arthritis, left knee   The patient history, physical examination, clinical judgment of the provider and imaging studies are consistent with end stage degenerative joint disease of the left knee and total knee arthroplasty is deemed medically necessary. The treatment options including medical management, injection therapy arthroscopy and arthroplasty were discussed at length. The risks and benefits of total knee arthroplasty were presented and reviewed. The risks due to aseptic loosening, infection, stiffness, patella tracking problems, thromboembolic complications and other imponderables were discussed. The patient acknowledged the explanation, agreed to proceed with the plan and consent was signed. Patient is being admitted for inpatient treatment for surgery, pain control, PT, OT, prophylactic antibiotics, VTE prophylaxis, progressive ambulation and ADLs and discharge planning. The patient is planning to be discharged  home .  Patient's  anticipated LOS is less than 2 midnights, meeting these requirements - Lives within 1 hour of care - Has a competent adult at home to recover with post-op - NO history of  - Chronic pain requiring opiods  - Diabetes  - Coronary Artery Disease  -  Heart attack  - Stroke  - DVT/VTE  - Cardiac arrhythmia  - Respiratory Failure/COPD  - Renal failure  - Anemia  - Advanced Liver disease  Therapy Plans: [facility] Disposition: Home with Husband Planned DVT Prophylaxis: Xarelto 10 mg (hx of TIA in 2004) DME Needed: RW PCP: Woody Seller, MD (contacting for clearance) Cardiologist: Kirk Ruths, MD (contacting for clearance) TXA: IV Allergies: codeine (rash), prednisone (tachycardia), doxycycline (rash), erythromycin (rash) Anesthesia Concerns: None BMI: 32.5 Last HgbA1c: 7.8% - will recheck with pre-op labs  Pharmacy: [facility]  Other: -Given clearance forms to take to PCP and cardiologist -Recent dx of CHF  - Patient was instructed on what medications to stop prior to surgery. - Follow-up visit in 2 weeks with Dr. Wynelle Link - Begin physical therapy following surgery - Pre-operative lab work as pre-surgical testing - Prescriptions will be provided in hospital at time of discharge  R. Jaynie Bream, PA-C Orthopedic Surgery [facility] [place]

## 2022-10-26 ENCOUNTER — Telehealth: Payer: Self-pay | Admitting: *Deleted

## 2022-10-26 NOTE — Telephone Encounter (Signed)
Pt has been scheduled tele pre op appt 11/03/22 @ 2 pm. Med rec and consent are done.

## 2022-10-26 NOTE — Telephone Encounter (Signed)
Pt has been scheduled tele pre op appt 11/03/22 @ 2 pm. Med rec and consent are done.     Patient Consent for Virtual Visit        [name] [name] has provided verbal consent on 10/26/2022 for a virtual visit (video or telephone).   CONSENT FOR VIRTUAL VISIT FOR:  [name] [name]  By participating in this virtual visit I agree to the following:  I hereby voluntarily request, consent and authorize [facility] and its employed or contracted physicians, physician assistants, nurse practitioners or other licensed health care professionals (the Practitioner), to provide me with telemedicine health care services (the "Services") as deemed necessary by the treating Practitioner. I acknowledge and consent to receive the Services by the Practitioner via telemedicine. I understand that the telemedicine visit will involve communicating with the Practitioner through live audiovisual communication technology and the disclosure of certain medical information by electronic transmission. I acknowledge that I have been given the opportunity to request an in-person assessment or other available alternative prior to the telemedicine visit and am voluntarily participating in the telemedicine visit.  I understand that I have the right to withhold or withdraw my consent to the use of telemedicine in the course of my care at any time, without affecting my right to future care or treatment, and that the Practitioner or I may terminate the telemedicine visit at any time. I understand that I have the right to inspect all information obtained and/or recorded in the course of the telemedicine visit and may receive copies of available information for a reasonable fee.  I understand that some of the potential risks of receiving the Services via telemedicine include:  Delay or interruption in medical evaluation due to technological equipment failure or disruption; Information transmitted may not be sufficient (e.g. poor  resolution of images) to allow for appropriate medical decision making by the Practitioner; and/or  In rare instances, security protocols could fail, causing a breach of personal health information.  Furthermore, I acknowledge that it is my responsibility to provide information about my medical history, conditions and care that is complete and accurate to the best of my ability. I acknowledge that Practitioner's advice, recommendations, and/or decision may be based on factors not within their control, such as incomplete or inaccurate data provided by me or distortions of diagnostic images or specimens that may result from electronic transmissions. I understand that the practice of medicine is not an exact science and that Practitioner makes no warranties or guarantees regarding treatment outcomes. I acknowledge that a copy of this consent can be made available to me via my patient portal ([facility]), or I can request a printed copy by calling the office of [facility].    I understand that my insurance will be billed for this visit.   I have read or had this consent read to me. I understand the contents of this consent, which adequately explains the benefits and risks of the Services being provided via telemedicine.  I have been provided ample opportunity to ask questions regarding this consent and the Services and have had my questions answered to my satisfaction. I give my informed consent for the services to be provided through the use of telemedicine in my medical care

## 2022-10-26 NOTE — Telephone Encounter (Signed)
   Name: [name] [name]  DOB: [birth year]  MRN: [id number]  Primary Cardiologist: Kirk Ruths, MD   Preoperative team, please contact this patient and set up a phone call appointment for further preoperative risk assessment. Please obtain consent and complete medication review. Thank you for your help.    Mayra Reel, NP 10/26/2022, 9:14 AM [place]

## 2022-10-26 NOTE — Telephone Encounter (Signed)
   Pre-operative Risk Assessment    Patient Name: [name] [name]  DOB: 06-Dec-1960 MRN: [id number]      Request for Surgical Clearance    Procedure:   left total knee arthroplasty  Date of Surgery:  Clearance 11/20/22                                 Surgeon:  frank aluisio md Surgeon's Group or Practice Name:  Nurse, children's number:  [phone] Fax number:  [phone]   Type of Clearance Requested:   - Medical    Type of Anesthesia:  choice anesthesia   Additional requests/questions:    [name]   10/26/2022, 8:41 AM

## 2022-11-03 ENCOUNTER — Ambulatory Visit: Payer: Medicare HMO

## 2022-11-07 DIAGNOSIS — F330 Major depressive disorder, recurrent, mild: Secondary | ICD-10-CM | POA: Diagnosis not present

## 2022-11-07 DIAGNOSIS — R69 Illness, unspecified: Secondary | ICD-10-CM | POA: Diagnosis not present

## 2022-11-10 ENCOUNTER — Telehealth: Payer: Self-pay | Admitting: *Deleted

## 2022-11-10 ENCOUNTER — Telehealth: Payer: Self-pay

## 2022-11-10 NOTE — Telephone Encounter (Signed)
Called and made the patient aware that SHE may proceed with the [facility] Sleep Study. PIN # provided to the patient. Patient made aware that SHE will be contacted after the test has been read with the results and any recommendations. Patient verbalized understanding and thanked me for the call.

## 2022-11-10 NOTE — Telephone Encounter (Signed)
Secure chat message sen to Marzetta Board to contact the patient to follow up on itamar given in November. She has not done the test.

## 2022-11-13 ENCOUNTER — Telehealth: Payer: Self-pay | Admitting: Cardiology

## 2022-11-13 ENCOUNTER — Encounter (INDEPENDENT_AMBULATORY_CARE_PROVIDER_SITE_OTHER): Payer: 59 | Admitting: Cardiology

## 2022-11-13 DIAGNOSIS — G4733 Obstructive sleep apnea (adult) (pediatric): Secondary | ICD-10-CM | POA: Diagnosis not present

## 2022-11-13 NOTE — Telephone Encounter (Signed)
Returned call to patient and advised her that records are available in [place]. Patient also wants to make Dr. Stanford Breed aware that her knee surgery has been postponed- will forward to make aware.

## 2022-11-13 NOTE — Telephone Encounter (Signed)
  Pt is calling, to check if we received her medical record from Dr. Einar Gip. She said, per Dr. Irven Shelling office they sent it to use last 02/28

## 2022-11-20 DIAGNOSIS — Z01818 Encounter for other preprocedural examination: Secondary | ICD-10-CM

## 2022-11-22 ENCOUNTER — Encounter: Payer: Self-pay | Admitting: Cardiology

## 2022-11-27 ENCOUNTER — Ambulatory Visit: Payer: Medicare HMO | Attending: Cardiology

## 2022-11-27 NOTE — Procedures (Signed)
SLEEP STUDY REPORT Patient Information Study Date: 11/13/2022 Patient Name: [name] [name] Patient ID: [id number] Birth Date: 1960/12/13 Age: 62 Gender: Female BMI: 32.5 (W=165 lb, H=5' 0'') Referring Physician: Kirk Ruths, MD  TEST DESCRIPTION:  Home sleep apnea testing was completed using the WatchPat, a Type 1 device, utilizing peripheral arterial tonometry (PAT), chest movement, actigraphy, pulse oximetry, pulse rate, body position and snore.  AHI was calculated with apnea and hypopnea using valid sleep time as the denominator. RDI includes apneas, hypopneas, and RERAs.  The data acquired and the scoring of sleep and all associated events were performed in accordance with the recommended standards and specifications as outlined in the AASM Manual for the Scoring of Sleep and Associated Events 2.2.0 (2015).  FINDINGS:  1.  Moderate Obstructive Sleep Apnea with AHI 19.9/hr.   2.  No Central Sleep Apnea with pAHIc 1.1/hr.  3.  Oxygen desaturations as low as 83%.  4.  Severe snoring was present. O2 sats were < 88% for 3.1 min.  5.  Total sleep time was 8 hrs and 5 min.  6.  23.1% of total sleep time was spent in REM sleep.   7.  Normal sleep onset latency at 22 min  8.  Prolonged REM sleep onset latency at 329 min.   9.  Total awakenings were 19.  10. Arrhythmia detection:  None.  DIAGNOSIS:   Moderate Obstructive Sleep Apnea (G47.33)  RECOMMENDATIONS:   1.  Clinical correlation of these findings is necessary.  The decision to treat obstructive sleep apnea (OSA) is usually based on the presence of apnea symptoms or the presence of associated medical conditions such as Hypertension, Congestive Heart Failure, Atrial Fibrillation or Obesity.  The most common symptoms of OSA are snoring, gasping for breath while sleeping, daytime sleepiness and fatigue.   2.  Initiating apnea therapy is recommended given the presence of symptoms and/or associated conditions. Recommend proceeding  with one of the following:     a.  Auto-CPAP therapy with a pressure range of 5-20cm H2O.     b.  An oral appliance (OA) that can be obtained from certain dentists with expertise in sleep medicine.  These are primarily of use in non-obese patients with mild and moderate disease.     c.  An ENT consultation which may be useful to look for specific causes of obstruction and possible treatment options.     d.  If patient is intolerant to PAP therapy, consider referral to ENT for evaluation for hypoglossal nerve stimulator.   3.  Close follow-up is necessary to ensure success with CPAP or oral appliance therapy for maximum benefit.  4.  A follow-up oximetry study on CPAP is recommended to assess the adequacy of therapy and determine the need for supplemental oxygen or the potential need for Bi-level therapy.  An arterial blood gas to determine the adequacy of baseline ventilation and oxygenation should also be considered.  5.  Healthy sleep recommendations include:  adequate nightly sleep (normal 7-9 hrs/night), avoidance of caffeine after noon and alcohol near bedtime, and maintaining a sleep environment that is cool, dark and quiet.  6.  Weight loss for overweight patients is recommended.  Even modest amounts of weight loss can significantly improve the severity of sleep apnea.  7.  Snoring recommendations include:  weight loss where appropriate, side sleeping, and avoidance of alcohol before bed.  8.  Operation of motor vehicle should not be performed when sleepy.  Signature: Fransico Him, MD; [facility];  Diplomat, Tax adviser of Sleep Medicine Electronically Signed: 11/27/2022

## 2022-11-28 DIAGNOSIS — E1165 Type 2 diabetes mellitus with hyperglycemia: Secondary | ICD-10-CM | POA: Diagnosis not present

## 2022-11-28 DIAGNOSIS — I10 Essential (primary) hypertension: Secondary | ICD-10-CM | POA: Diagnosis not present

## 2022-11-28 DIAGNOSIS — R5383 Other fatigue: Secondary | ICD-10-CM | POA: Diagnosis not present

## 2022-11-29 ENCOUNTER — Other Ambulatory Visit: Payer: Self-pay | Admitting: Cardiology

## 2022-11-29 DIAGNOSIS — G4733 Obstructive sleep apnea (adult) (pediatric): Secondary | ICD-10-CM

## 2022-12-04 DIAGNOSIS — R69 Illness, unspecified: Secondary | ICD-10-CM | POA: Diagnosis not present

## 2022-12-04 DIAGNOSIS — F330 Major depressive disorder, recurrent, mild: Secondary | ICD-10-CM | POA: Diagnosis not present

## 2022-12-12 ENCOUNTER — Other Ambulatory Visit: Payer: Self-pay | Admitting: *Deleted

## 2022-12-12 ENCOUNTER — Other Ambulatory Visit: Payer: Self-pay | Admitting: Cardiology

## 2022-12-12 ENCOUNTER — Telehealth: Payer: Self-pay | Admitting: *Deleted

## 2022-12-12 DIAGNOSIS — R0683 Snoring: Secondary | ICD-10-CM

## 2022-12-12 DIAGNOSIS — G4733 Obstructive sleep apnea (adult) (pediatric): Secondary | ICD-10-CM

## 2022-12-12 NOTE — Telephone Encounter (Signed)
APAP order was sent to [facility] Patient scheduled to be set up on 12/13/22.

## 2022-12-13 DIAGNOSIS — G4733 Obstructive sleep apnea (adult) (pediatric): Secondary | ICD-10-CM | POA: Diagnosis not present

## 2022-12-26 ENCOUNTER — Telehealth: Payer: Self-pay | Admitting: Cardiology

## 2022-12-26 NOTE — Telephone Encounter (Signed)
Patient has no chest pain at this moment.  Last night chest started hurting, sick on stomach (nausea) and feet were cold.She felt like she was burning up like she was sweating. Pain was more In the chest, non radiating, constant, pressure on the chest.  This happened about 10:30 pm, when she had been on the phone with her brother and received some disturbing news. She does state she has anxiety.  After the call she kept worrying about it so not sure if it was anxiety related. Her husband came home and helped her settle down and she took Xanax as well. She states it "took a while and I fell asleep cause I took a ".  She did not reach out to 911 or go to ED, but husband wants her to reach out to the doctor today.  No chest pain today or through the night.  All symptoms resolved.  Advised if this re-occurs to go to the ER for evaluation.  Until then she will rest and wait for doctor recommendations

## 2022-12-26 NOTE — Telephone Encounter (Signed)
Spoke with pt, Aware of dr Ludwig Clarks recommendations. She has a follow up appointment next week with dr Jens Som and will keep that appointment. She voiced, if it happens again she will go straight to the ER.

## 2022-12-26 NOTE — Telephone Encounter (Signed)
Pt c/o of Chest Pain: STAT if CP now or developed within 24 hours  1. Are you having CP right now? No  2. Are you experiencing any other symptoms (ex. SOB, nausea, vomiting, sweating)? Hot and sweaty, but pt's feet were cold  3. How long have you been experiencing CP? Started last night  4. Is your CP continuous or coming and going? Coming and going  5. Have you taken Nitroglycerin? No  ?

## 2023-01-02 ENCOUNTER — Encounter: Payer: Self-pay | Admitting: Cardiology

## 2023-01-02 ENCOUNTER — Ambulatory Visit: Payer: 59 | Attending: Cardiology | Admitting: Cardiology

## 2023-01-02 VITALS — BP 134/84 | HR 71 | Ht 59.0 in | Wt 167.8 lb

## 2023-01-02 DIAGNOSIS — E7800 Pure hypercholesterolemia, unspecified: Secondary | ICD-10-CM | POA: Diagnosis not present

## 2023-01-02 DIAGNOSIS — I10 Essential (primary) hypertension: Secondary | ICD-10-CM | POA: Diagnosis not present

## 2023-01-02 DIAGNOSIS — I5032 Chronic diastolic (congestive) heart failure: Secondary | ICD-10-CM | POA: Diagnosis not present

## 2023-01-02 DIAGNOSIS — R072 Precordial pain: Secondary | ICD-10-CM | POA: Diagnosis not present

## 2023-01-02 MED ORDER — METOPROLOL TARTRATE 100 MG PO TABS: ORAL_TABLET | ORAL | 0 refills | Status: DC

## 2023-01-02 NOTE — Patient Instructions (Signed)
  Testing/Procedures:    Your cardiac CT will be scheduled at   [facility] [address] [place], [place] [address] [phone]   If scheduled at [facility], please arrive at the [facility] and Children's Entrance (Entrance C2) of [facility] 30 minutes prior to test start time. You can use the FREE valet parking offered at entrance C (encouraged to control the heart rate for the test)  Proceed to the [facility] Radiology Department (first floor) to check-in and test prep.  All radiology patients and guests should use entrance C2 at [facility], accessed from [facility], even though the hospital's physical address listed is [address]       Please follow these instructions carefully (unless otherwise directed):    On the Night Before the Test: Be sure to Drink plenty of water. Do not consume any caffeinated/decaffeinated beverages or chocolate 12 hours prior to your test. Do not take any antihistamines 12 hours prior to your test.  On the Day of the Test: Drink plenty of water until 1 hour prior to the test. Do not eat any food 1 hour prior to test. You may take your regular medications prior to the test.  Take metoprolol (Lopressor) 100 mg two hours prior to test. DO NOT TAKE FUROSEMIDE OR SPIRONOLACTONE THE DAY BEFORE OR THE DAY OF THE CT SCAN FEMALES- please wear underwire-free bra if available, avoid dresses & tight clothing   After the Test: Drink plenty of water. After receiving IV contrast, you may experience a mild flushed feeling. This is normal. On occasion, you may experience a mild rash up to 24 hours after the test. This is not dangerous. If this occurs, you can take Benadryl 25 mg and increase your fluid intake. If you experience trouble breathing, this can be serious. If it is severe call 911 IMMEDIATELY. If it is mild, please call our office.   We will call to schedule your test 2-4 weeks  out understanding that some insurance companies will need an authorization prior to the service being performed.   For non-scheduling related questions, please contact the cardiac imaging nurse navigator should you have any questions/concerns: [name] [name], Cardiac Imaging Nurse Navigator Larey Brick, Cardiac Imaging Nurse Navigator [facility] Direct Office Dial: [phone]   For scheduling needs, including cancellations and rescheduling, please call [place], [phone].    Follow-Up: At [facility], you and your health needs are our priority.  As part of our continuing mission to provide you with exceptional heart care, we have created designated Provider Care Teams.  These Care Teams include your primary Cardiologist (physician) and Advanced Practice Providers (APPs -  Physician Assistants and Nurse Practitioners) who all work together to provide you with the care you need, when you need it.  We recommend signing up for the patient portal called "MyChart".  Sign up information is provided on this After Visit Summary.  MyChart is used to connect with patients for Virtual Visits (Telemedicine).  Patients are able to view lab/test results, encounter notes, upcoming appointments, etc.  Non-urgent messages can be sent to your provider as well.   To learn more about what you can do with MyChart, go to [email or web address].    Your next appointment:   6 month(s)  Provider:   ANY APP

## 2023-01-02 NOTE — Progress Notes (Signed)
HPI: FU CHF.  Previously followed by Dr. Nadara Eaton.  Patient apparently had cardiac catheterization in 2005 showing normal coronary arteries.  Carotid Dopplers May 2020 showed 1 to 39% bilateral stenosis.  Nuclear study May 2023 showed ejection fraction 69% and normal perfusion.  Echocardiogram May 2023 showed normal LV function and grade 1 diastolic dysfunction.  Patient also carries a history of chronic diastolic congestive heart failure. Since last seen, patient states last week she had an episode of chest pain.  It was described as a pressure with shortness of breath and nausea.  She did take a Xanax for the pain as she was concerned it may be anxiety.  She otherwise does have some dyspnea on exertion but no orthopnea, PND or pedal edema.  No exertional chest pain.  Current Outpatient Medications  Medication Sig Dispense Refill   ALPRAZolam (XANAX) 1 MG tablet Take 1 mg by mouth 3 (three) times daily as needed.      aspirin 81 MG tablet Take 81 mg by mouth at bedtime.      atorvastatin (LIPITOR) 20 MG tablet Take 20 mg by mouth daily.     buPROPion (WELLBUTRIN XL) 150 MG 24 hr tablet Take 150 mg by mouth every morning.     Choline Fenofibrate (FENOFIBRIC ACID) 135 MG CPDR Take 1 capsule by mouth daily.     citalopram (CELEXA) 40 MG tablet Take 40 mg by mouth daily.     furosemide (LASIX) 20 MG tablet Twice daily as needed if weight >3 Lbs 180 tablet 0   gabapentin (NEURONTIN) 400 MG capsule Take 400 mg by mouth 2 (two) times daily.     glimepiride (AMARYL) 4 MG tablet Take 4 mg by mouth daily.     insulin glargine (LANTUS) 100 UNIT/ML injection Inject 18 Units into the skin daily.     JARDIANCE 10 MG TABS tablet Take 10 mg by mouth daily.     lidocaine (LIDODERM) 5 % Place 1 patch onto the skin daily.     lidocaine (XYLOCAINE) 5 % ointment Apply 1 Application topically 4 (four) times daily as needed.     methylcellulose (CITRUCEL) oral powder Take 1 packet by mouth daily.      metoprolol succinate (TOPROL-XL) 50 MG 24 hr tablet TAKE ONE TABLET BY MOUTH EVERY DAY WITH A MEAL 90 tablet 3   omeprazole (PRILOSEC) 20 MG capsule Take 20 mg by mouth at bedtime.   9   potassium chloride SA (KLOR-CON) 20 MEQ tablet Take 20 mEq by mouth daily as needed (With Furosemide). Only if furosemide taken. 180 tablet 1   promethazine (PHENERGAN) 12.5 MG tablet Take 12.5 mg by mouth every 8 (eight) hours as needed.     Semaglutide,0.25 or 0.5MG /DOS, 2 MG/1.5ML SOPN Inject 2.5 mLs into the skin every Wednesday. OZEMPIC     spironolactone (ALDACTONE) 50 MG tablet TAKE 1 TABLET BY MOUTH EVERY DAY IN THE MORNING 90 tablet 1   verapamil (CALAN-SR) 120 MG CR tablet Take 120 mg by mouth at bedtime.   11   No current facility-administered medications for this visit.     Past Medical History:  Diagnosis Date   Abnormal EKG    Allergy    Anxiety    PTSD   CHF (congestive heart failure)    Depression    Diabetes mellitus without complication    GERD (gastroesophageal reflux disease)    Hyperlipidemia    Hypertension    Unspecified  IBS (irritable bowel syndrome)    Kidney stone    Panic disorder 03/20/2019   Seizures    Reports last seizure was 30 + years ago    Sleep apnea    no CPAP worn   Stroke     Past Surgical History:  Procedure Laterality Date   HYSTEROSCOPY  2001   submucous myomectomy   TOTAL ABDOMINAL HYSTERECTOMY  2002   TAH BSO  pathology showed leiomyoma, adenomyosis, simple and complex hyperplasia without atypia   TUMOR REMOVAL     from left ear    Social History   Socioeconomic History   Marital status: Married    Spouse name: Not on file   Number of children: 0   Years of education: Not on file   Highest education level: Some college, no degree  Occupational History   Occupation: Full Time    Comment: Sales  Tobacco Use   Smoking status: Never   Smokeless tobacco: Never  Vaping Use   Vaping Use: Never used  Substance and Sexual Activity    Alcohol use: No    Alcohol/week: 0.0 standard drinks of alcohol   Drug use: No   Sexual activity: Not Currently    Comment: 1st intercourse 62 yo-Fewer than 5 partners  Other Topics Concern   Not on file  Social History Narrative   Right handed   Caffeine 1-2 cups daily    Lives at home with husband with 2 dogs    Regular exercise: No   Social Determinants of Corporate investment banker Strain: Not on file  Food Insecurity: Not on file  Transportation Needs: Not on file  Physical Activity: Not on file  Stress: Not on file  Social Connections: Not on file  Intimate Partner Violence: Not on file    Family History  Problem Relation Age of Onset   Uterine cancer Mother    Irritable bowel syndrome Mother    Stroke Mother    Osteoporosis Mother    Cancer Father        Prostate   High blood pressure Father    Heart attack Brother 28   Cancer Brother        Prostate   Heart disease Maternal Grandmother    Coronary artery disease Paternal Grandfather    Colitis Neg Hx    Esophageal cancer Neg Hx    Stomach cancer Neg Hx    Rectal cancer Neg Hx     ROS: no fevers or chills, productive cough, hemoptysis, dysphasia, odynophagia, melena, hematochezia, dysuria, hematuria, rash, seizure activity, orthopnea, PND, pedal edema, claudication. Remaining systems are negative.  Physical Exam: Well-developed well-nourished in no acute distress.  Skin is warm and dry.  HEENT is normal.  Neck is supple.  Chest is clear to auscultation with normal expansion.  Cardiovascular exam is regular rate and rhythm.  Abdominal exam nontender or distended. No masses palpated. Extremities show no edema. neuro grossly intact  Electrocardiogram today shows normal sinus rhythm at a rate of 73, lateral T wave inversion with minimal ST elevation in inferior leads.  Similar to September 05, 2022; personally reviewed.  A/P  1 chronic diastolic congestive heart failure-patient remains euvolemic on  examination.  Continue spironolactone at present dose.  Continue Lasix as needed.  2 sleep apnea-managed by Dr. Mayford Knife.  3 hypertension-blood pressure controlled.  Continue present medications.  4 hyperlipidemia-continue statin.  5 chest pain-episode of chest pain last week.  Will arrange cardiac CTA to rule out obstructive  coronary disease.  Hold spironolactone the day before and the day of the test as well as Lasix.  Olga Millers, MD

## 2023-01-04 ENCOUNTER — Telehealth: Payer: Self-pay | Admitting: Cardiology

## 2023-01-04 DIAGNOSIS — I10 Essential (primary) hypertension: Secondary | ICD-10-CM | POA: Diagnosis not present

## 2023-01-04 DIAGNOSIS — F419 Anxiety disorder, unspecified: Secondary | ICD-10-CM | POA: Diagnosis not present

## 2023-01-04 DIAGNOSIS — R69 Illness, unspecified: Secondary | ICD-10-CM | POA: Diagnosis not present

## 2023-01-04 NOTE — Telephone Encounter (Signed)
Her BP was 95/50 in right arm, then 132/65 in her left arm when she was just at her other Dr's appt with Dr Shawnee Knapp today.  She says that she feels off balance/dizzy/wobbly;head feels a little funny/foggy- light-headed. She started feeling this way on Tuesday.   She was told by Dr today to check her BP everyday sitting and standing and keep a log of BP's. She said she has not made any changes to her medications. She reports that she has not drank that much water today.   Instructed her to stay hydrated and when going from lying/sitting/standing- do it very slowly. Given ER precautions.  Went over instructions for CTA- also added, not to take Metoprolol Succinate before CT, just take Metoprolol Tartrate 100 mg instead. Asked her to review the instructions that were printed out for her and if she has any questions, then to let us know. (She is currently on the way home from her appt)  She was just wondering if any changes needed to be made to her meds due to the way she has been feeling and the one low blood pressure reading. I told her that I would send this info to her Dr and get back to her with any recommendations.   She verbalized understanding.

## 2023-01-04 NOTE — Telephone Encounter (Signed)
Pt c/o medication issue:  1. Name of Medication:  metoprolol succinate (TOPROL-XL) 50 MG 24 hr tablet  2. How are you currently taking this medication (dosage and times per day)?   3. Are you having a reaction (difficulty breathing--STAT)?   4. What is your medication issue?   Patient states she has concerns with taking Metoprolol because her BP was 95/50. She would also like to discuss the process for her upcoming cardiac CT. Will she have to take an injection? Please advise.

## 2023-01-05 MED ORDER — METOPROLOL SUCCINATE ER 25 MG PO TB24: 25.0000 mg | ORAL_TABLET | Freq: Every day | ORAL | 3 refills | Status: DC

## 2023-01-05 NOTE — Telephone Encounter (Signed)
Spoke with pt, Aware of dr Ludwig Clarks recommendations. She will take 1/2 of the 50 mg tablet she has. New script sent to the pharmacy

## 2023-01-08 ENCOUNTER — Other Ambulatory Visit (HOSPITAL_COMMUNITY)
Admission: RE | Admit: 2023-01-08 | Discharge: 2023-01-08 | Disposition: A | Discharge status: Home / Self Care | Payer: 59 | Source: Ambulatory Visit | Attending: Cardiology | Admitting: Cardiology

## 2023-01-08 ENCOUNTER — Telehealth (HOSPITAL_COMMUNITY): Payer: Self-pay | Admitting: Emergency Medicine

## 2023-01-08 ENCOUNTER — Other Ambulatory Visit (HOSPITAL_COMMUNITY): Payer: Self-pay | Admitting: Emergency Medicine

## 2023-01-08 DIAGNOSIS — R079 Chest pain, unspecified: Secondary | ICD-10-CM

## 2023-01-08 DIAGNOSIS — R072 Precordial pain: Secondary | ICD-10-CM | POA: Insufficient documentation

## 2023-01-08 LAB — BASIC METABOLIC PANEL
Anion gap: 11 (ref 5–15)
BUN: 19 mg/dL (ref 8–23)
CO2: 21 mmol/L — ABNORMAL LOW (ref 22–32)
Calcium: 9.4 mg/dL (ref 8.9–10.3)
Chloride: 103 mmol/L (ref 98–111)
Creatinine, Ser: 1.19 mg/dL — ABNORMAL HIGH (ref 0.44–1.00)
GFR, Estimated: 51 mL/min — ABNORMAL LOW (ref >60)
Glucose, Bld: 150 mg/dL — ABNORMAL HIGH (ref 70–99)
Potassium: 3.6 mmol/L (ref 3.5–5.1)
Sodium: 135 mmol/L (ref 135–145)

## 2023-01-08 NOTE — Telephone Encounter (Signed)
Reaching out to patient to offer assistance regarding upcoming cardiac imaging study; pt verbalizes understanding of appt date/time, parking situation and where to check in, pre-test NPO status and medications ordered, and verified current allergies; name and call back number provided for further questions should they arise Rockwell Alexandria RN Navigator Cardiac Imaging [facility] [phone] office [phone] cell   100mg  metoprolol tartrate  Getting labs today - BMP since she's diabetic Denies iv issues Aware contrast/nitro Arrival 100

## 2023-01-09 ENCOUNTER — Ambulatory Visit (HOSPITAL_COMMUNITY)
Admission: RE | Admit: 2023-01-09 | Discharge: 2023-01-09 | Disposition: A | Discharge status: Home / Self Care | Payer: 59 | Source: Ambulatory Visit | Attending: Cardiology | Admitting: Cardiology

## 2023-01-09 ENCOUNTER — Other Ambulatory Visit: Payer: Self-pay | Admitting: Cardiology

## 2023-01-09 ENCOUNTER — Ambulatory Visit (HOSPITAL_COMMUNITY)
Admission: RE | Admit: 2023-01-09 | Discharge: 2023-01-09 | Disposition: A | Discharge status: Home / Self Care | Payer: Medicare HMO | Source: Ambulatory Visit | Attending: Cardiology | Admitting: Cardiology

## 2023-01-09 DIAGNOSIS — I2510 Atherosclerotic heart disease of native coronary artery without angina pectoris: Secondary | ICD-10-CM

## 2023-01-09 DIAGNOSIS — R931 Abnormal findings on diagnostic imaging of heart and coronary circulation: Secondary | ICD-10-CM

## 2023-01-09 DIAGNOSIS — R072 Precordial pain: Secondary | ICD-10-CM | POA: Insufficient documentation

## 2023-01-09 MED ORDER — METOPROLOL TARTRATE 5 MG/5ML IV SOLN
10.0000 mg | Freq: Once | INTRAVENOUS | Status: AC
  Administered 2023-01-09: 10 mg via INTRAVENOUS

## 2023-01-09 MED ORDER — NITROGLYCERIN 0.4 MG SL SUBL
0.8000 mg | SUBLINGUAL_TABLET | Freq: Once | SUBLINGUAL | Status: AC
  Administered 2023-01-09: 0.8 mg via SUBLINGUAL

## 2023-01-09 MED ORDER — METOPROLOL TARTRATE 5 MG/5ML IV SOLN
INTRAVENOUS | Status: AC
  Filled 2023-01-09: qty 10

## 2023-01-09 MED ORDER — NITROGLYCERIN 0.4 MG SL SUBL
SUBLINGUAL_TABLET | SUBLINGUAL | Status: AC
  Filled 2023-01-09: qty 2

## 2023-01-09 MED ORDER — IOHEXOL 350 MG/ML SOLN
95.0000 mL | Freq: Once | INTRAVENOUS | Status: AC | PRN
  Administered 2023-01-09: 95 mL via INTRAVENOUS

## 2023-01-11 ENCOUNTER — Telehealth: Payer: Self-pay | Admitting: *Deleted

## 2023-01-11 ENCOUNTER — Encounter: Payer: Self-pay | Admitting: *Deleted

## 2023-01-11 DIAGNOSIS — E7800 Pure hypercholesterolemia, unspecified: Secondary | ICD-10-CM

## 2023-01-11 MED ORDER — ATORVASTATIN CALCIUM 80 MG PO TABS
80.0000 mg | ORAL_TABLET | Freq: Every day | ORAL | 3 refills | Status: DC
Start: 2023-01-11 — End: 2024-01-15

## 2023-01-11 NOTE — Telephone Encounter (Signed)
This encounter was created in error - please disregard.

## 2023-01-11 NOTE — Telephone Encounter (Signed)
pt aware of results  New script sent to the pharmacy  Lab orders mailed to the pt  

## 2023-01-11 NOTE — Telephone Encounter (Signed)
-----   Message from Lewayne Bunting, MD sent at 01/10/2023  7:36 AM EDT ----- Moderate coronary artery disease noted.  FFR negative.  Will plan medical therapy.  Increase Lipitor to 80 mg daily.  Check lipids and liver in 8 weeks.  Continue aspirin. Olga Millers, MD

## 2023-01-15 ENCOUNTER — Ambulatory Visit: Payer: Medicare HMO | Admitting: Cardiology

## 2023-01-23 ENCOUNTER — Telehealth: Payer: Self-pay | Admitting: *Deleted

## 2023-01-23 NOTE — Telephone Encounter (Signed)
   Pre-operative Risk Assessment    Patient Name: [name] [name]  DOB: [birth year] MRN: [id number]      Request for Surgical Clearance    Procedure:   LEFT TOTAL KNEE ARTHROPLASTY  Date of Surgery:  Clearance 02/19/23                                 Surgeon:  DR. Ollen Gross Surgeon's Group or Practice Name:  [facility] Phone number:  [phone] ATTN: Aida Raider Fax number:  [phone]   Type of Clearance Requested:   - Medical ; ASA    Type of Anesthesia:   CHOICE   Additional requests/questions:    [name]   01/23/2023, 12:41 PM =

## 2023-01-23 NOTE — Telephone Encounter (Signed)
   Patient Name: [name]  DOB: 1961/01/08 MRN: [id number]  Primary Cardiologist: Olga Millers, MD  Chart reviewed as part of pre-operative protocol coverage. Pre-op clearance already addressed by colleagues in earlier phone notes. To summarize recommendations:  - Recent cardiac CTA with moderate coronary disease not flow-limiting.  She may proceed without further cardiac testing. Olga Millers    She can hold her ASA x 5-7 days prior to surgery and restart when medically safe to do so.  Will route this bundled recommendation to requesting provider via Epic fax function and remove from pre-op pool. Please call with questions.  Sharlene Dory, PA-C 01/23/2023, 4:41 PM

## 2023-01-23 NOTE — H&P (Signed)
TOTAL KNEE ADMISSION H&P  Patient is being admitted for left total knee arthroplasty.  Subjective:  Chief Complaint: Left knee pain.  HPI: [name] [name], 62 y.o. female has a history of pain and functional disability in the left knee due to arthritis and has failed non-surgical conservative treatments for greater than 12 weeks to include NSAID's and/or analgesics, corticosteriod injections, and activity modification. Onset of symptoms was gradual, starting  several  years ago with gradually worsening course since that time. The patient noted no past surgery on the left knee.  Patient currently rates pain in the left knee at 6 out of 10 with activity. Patient has night pain, worsening of pain with activity and weight bearing, pain with passive range of motion, and crepitus. Patient has evidence of  bone-on-bone arthritis in the medial and patellofemoral compartments of the left knee  by imaging studies. There is no active infection.  Patient Active Problem List   Diagnosis Date Noted   AKI (acute kidney injury) (HCC)    Chronic diastolic heart failure (HCC)    Class 1 obesity due to excess calories with body mass index (BMI) of 31.0 to 31.9 in adult    Type 2 diabetes mellitus with diabetic neuropathy, without long-term current use of insulin (HCC)    Gastroesophageal reflux disease    Hyperkalemia 02/08/2020   Panic disorder 03/20/2019   External hemorrhoids 12/08/2014   Rectal bleeding 11/22/2014   Hemorrhoids, internal 11/22/2014   Essential hypertension 06/08/2009   DYSPNEA 06/08/2009   ABNORMAL EKG 06/08/2009    Past Medical History:  Diagnosis Date   Abnormal EKG    Allergy    Anxiety    PTSD   CHF (congestive heart failure) (HCC)    Depression    Diabetes mellitus without complication (HCC)    GERD (gastroesophageal reflux disease)    Hyperlipidemia    Hypertension    Unspecified   IBS (irritable bowel syndrome)    Kidney stone    Panic disorder 03/20/2019   Seizures  (HCC)    Reports last seizure was 30 + years ago    Sleep apnea    no CPAP worn   Stroke [facility])     Past Surgical History:  Procedure Laterality Date   HYSTEROSCOPY  2001   submucous myomectomy   TOTAL ABDOMINAL HYSTERECTOMY  2002   TAH BSO  pathology showed leiomyoma, adenomyosis, simple and complex hyperplasia without atypia   TUMOR REMOVAL     from left ear    Prior to Admission medications   Medication Sig Start Date End Date Taking? Authorizing Provider  ALPRAZolam Prudy Feeler) 1 MG tablet Take 1 mg by mouth 3 (three) times daily as needed.     [provider]  aspirin 81 MG tablet Take 81 mg by mouth at bedtime.     [provider]  atorvastatin (LIPITOR) 80 MG tablet Take 1 tablet (80 mg total) by mouth daily. 01/11/23 04/11/23  Lewayne Bunting, MD  buPROPion (WELLBUTRIN XL) 150 MG 24 hr tablet Take 150 mg by mouth every morning. 06/02/20   [provider]  Choline Fenofibrate (FENOFIBRIC ACID) 135 MG CPDR Take 1 capsule by mouth daily. 11/19/20   [provider]  citalopram (CELEXA) 40 MG tablet Take 40 mg by mouth daily.    [provider]  furosemide (LASIX) 20 MG tablet Twice daily as needed if weight >3 Lbs 05/29/19   Yates Decamp, MD  gabapentin (NEURONTIN) 400 MG capsule Take 400 mg  by mouth 2 (two) times daily.    [provider]  glimepiride (AMARYL) 4 MG tablet Take 4 mg by mouth daily. 01/14/20   [provider]  insulin glargine (LANTUS) 100 UNIT/ML injection Inject 18 Units into the skin daily.    [provider]  JARDIANCE 10 MG TABS tablet Take 10 mg by mouth daily. 12/14/22   [provider]  lidocaine (LIDODERM) 5 % Place 1 patch onto the skin daily. 06/26/22   [provider]  lidocaine (XYLOCAINE) 5 % ointment Apply 1 Application topically 4 (four) times daily as needed.    [provider]  methylcellulose (CITRUCEL) oral powder Take 1 packet by mouth daily. 02/08/22   Rachael Fee, MD  metoprolol succinate (TOPROL-XL) 25 MG 24 hr tablet Take 1 tablet (25 mg total) by mouth daily. Take with or immediately following a meal. 01/05/23   Crenshaw, Madolyn Frieze, MD  metoprolol tartrate (LOPRESSOR) 100 MG tablet TAKE 2 HOURS PRIOR TO CT SCAN 01/02/23   Lewayne Bunting, MD  omeprazole (PRILOSEC) 20 MG capsule Take 20 mg by mouth at bedtime.  07/02/14   [provider]  potassium chloride SA (KLOR-CON) 20 MEQ tablet Take 20 mEq by mouth daily as needed (With Furosemide). Only if furosemide taken. 02/11/20   Yates Decamp, MD  promethazine (PHENERGAN) 12.5 MG tablet Take 12.5 mg by mouth every 8 (eight) hours as needed.    [provider]  Semaglutide,0.25 or 0.5MG /DOS, 2 MG/1.5ML SOPN Inject 2.5 mLs into the skin every Wednesday. OZEMPIC 08/14/18   [provider]  spironolactone (ALDACTONE) 50 MG tablet TAKE 1 TABLET BY MOUTH EVERY DAY IN THE MORNING 06/22/22   Yates Decamp, MD  verapamil (CALAN-SR) 120 MG CR tablet Take 120 mg by mouth at bedtime.  07/02/14   [provider]    Allergies  Allergen Reactions   Prednisone    Codeine Rash   Doxycycline Rash   Erythromycin Rash    Social History   Socioeconomic History   Marital status: Married    Spouse name: Not on file   Number of children: 0   Years of education: Not on file   Highest education level: Some college, no degree  Occupational History   Occupation: Full Time    Comment: Sales  Tobacco Use   Smoking status: Never   Smokeless tobacco: Never  Vaping Use   Vaping Use: Never used  Substance and Sexual Activity   Alcohol use: No    Alcohol/week: 0.0 standard drinks of alcohol   Drug use: No   Sexual activity: Not Currently    Comment: 1st intercourse 62 yo-Fewer than 5 partners  Other Topics Concern   Not on file  Social History Narrative   Right handed   Caffeine 1-2 cups daily    Lives at home with husband with 2 dogs    Regular exercise: No   Social  Determinants of Corporate investment banker Strain: Not on file  Food Insecurity: Not on file  Transportation Needs: Not on file  Physical Activity: Not on file  Stress: Not on file  Social Connections: Not on file  Intimate Partner Violence: Not on file    Tobacco Use: Low Risk  (01/02/2023)   Patient History    Smoking Tobacco Use: Never    Smokeless Tobacco Use: Never    Passive Exposure: Not on file   Social History   Substance and Sexual Activity  Alcohol Use No  Alcohol/week: 0.0 standard drinks of alcohol    Family History  Problem Relation Age of Onset   Uterine cancer Mother    Irritable bowel syndrome Mother    Stroke Mother    Osteoporosis Mother    Cancer Father        Prostate   High blood pressure Father    Heart attack Brother 93   Cancer Brother        Prostate   Heart disease Maternal Grandmother    Coronary artery disease Paternal Grandfather    Colitis Neg Hx    Esophageal cancer Neg Hx    Stomach cancer Neg Hx    Rectal cancer Neg Hx     ROS  Objective:  Physical Exam: Well nourished and well developed.  General: Alert and oriented x3, cooperative and pleasant, no acute distress.  Head: normocephalic, atraumatic, neck supple.  Eyes: EOMI.  Musculoskeletal:  Left Knee Exam:  No effusion present. No swelling present.  The Range of motion is: 5 to 125 degrees.  Moderate crepitus on range of motion of the knee.  Positive medial joint line tenderness.  No lateral joint line tenderness.  The knee is stable.   Calves soft and nontender. Motor function intact in LE. Strength 5/5 LE bilaterally. Neuro: Distal pulses 2+. Sensation to light touch intact in LE.  Imaging Review Plain radiographs demonstrate severe degenerative joint disease of the left knee. The overall alignment is neutral. The bone quality appears to be adequate for age and reported activity level.  Assessment/Plan:  End stage arthritis, left knee   The patient  history, physical examination, clinical judgment of the provider and imaging studies are consistent with end stage degenerative joint disease of the left knee and total knee arthroplasty is deemed medically necessary. The treatment options including medical management, injection therapy arthroscopy and arthroplasty were discussed at length. The risks and benefits of total knee arthroplasty were presented and reviewed. The risks due to aseptic loosening, infection, stiffness, patella tracking problems, thromboembolic complications and other imponderables were discussed. The patient acknowledged the explanation, agreed to proceed with the plan and consent was signed. Patient is being admitted for inpatient treatment for surgery, pain control, PT, OT, prophylactic antibiotics, VTE prophylaxis, progressive ambulation and ADLs and discharge planning. The patient is planning to be discharged  home .   Patient's anticipated LOS is less than 2 midnights, meeting these requirements: - Younger than 52 - Lives within 1 hour of care - Has a competent adult at home to recover with post-op recover - NO history of  - Chronic pain requiring opiods  - Coronary Artery Disease  - Heart attack  - DVT/VTE  - Cardiac arrhythmia  - Respiratory Failure/COPD  - Renal failure  - Anemia  - Advanced Liver disease  Therapy Plans: Outpatient therapy at [facility] Disposition: Home with husband Planned DVT Prophylaxis: Xarelto 10 mg QD DME Needed: None PCP: Doreen Beam, MD (clearance pending A1c improvement) Cardiologist: Olga Millers, MD TXA: IV Allergies: Codeine (rash), prednisone (hot flashes) Anesthesia Concerns: None BMI: 32.5 Last HgbA1c: 8.2% (12/14/22) Pharmacy: [facility]  Other: - Hemoglobin A1c 8.2% as of 12/14/22. Since then, increased Ozempic and insulin dose and started Jardiance. Will recheck with preop labs.  - No formal clearance from Dr. Jens Som. Underwent chest CTA on 5/4. Faxing  clearance form to their office. - PMH: DM, CHF, CAD, stroke in 2004 - Discussed dilaudid postoperatively - Takes ozempic on Sundays. Informed to skip doses on June 2nd &  9th  - Patient was instructed on what medications to stop prior to surgery. - Follow-up visit in 2 weeks with Dr. Lequita Halt - Begin physical therapy following surgery - Pre-operative lab work as pre-surgical testing - Prescriptions will be provided in hospital at time of discharge  Arther Abbott, PA-C Orthopedic Surgery [facility] [place]

## 2023-01-30 NOTE — Progress Notes (Addendum)
Anesthesia Review:  PCP: DR vyas clearance dated 10/24/22 on chart LOV 10/24/22 on chart  Cardiologist : Crenshaw- LOV 01/02/23 clearance Tessa Conte,PAC dated 01/23/23- Telephone encounter in epic  Chest x-ray :2v-09/06/22  Vasc surgery- Lollie Sails LOV 01/04/23  Neurology- 01/20/20 LOV DR Anne Hahn  EKG : 01/02/23  CT cors- 01/09/23  Echo : 01/29/22  Stress test: 01/27/22  Cardiac Cath :  Carotids- 2020  Activity level: can do a flgiht of stairs without difficutly  Sleep Study/ CPAP : has sleep apnea no cpap  Fasting Blood Sugar :      / Checks Blood Sugar -- times a day:   Blood Thinner/ Instructions /Last Dose: ASA / Instructions/ Last Dose :    81 g aspirin - pt to call DR Aluisio since she has not received any preop instructions.   DM- type 2  Checks glucose daily at home  Hg A1c- 02/06/23- 77.0 Ozempic- Last dose on 02/11/23  Jardiance Last dose on 02/15/23  Lantus- Take 1/2 dose nite before surgery Amary- None day of surgery    Hx of severe panic attacks followed by DR Anne Hahn- takes Xanax prn for this.  Instructed pt at time of preop that she could take Xanax If needed DOS.

## 2023-01-31 NOTE — Patient Instructions (Addendum)
SURGICAL WAITING ROOM VISITATION  Patients having surgery or a procedure may have no more than 2 support people in the waiting area - these visitors may rotate.    Children under the age of 60 must have an adult with them who is not the patient.  Due to an increase in RSV and influenza rates and associated hospitalizations, children ages 63 and under may not visit patients in [facility] hospitals.  If the patient needs to stay at the hospital during part of their recovery, the visitor guidelines for inpatient rooms apply. Pre-op nurse will coordinate an appropriate time for 1 support person to accompany patient in pre-op.  This support person may not rotate.    Please refer to the [facility] website for the visitor guidelines for Inpatients (after your surgery is over and you are in a regular room).       Your procedure is scheduled on:  02/19/2023    Report to [facility] Main Entrance    Report to admitting at   0600AM   Call this number if you have problems the morning of surgery [phone]   Do not eat food :After Midnight.   After Midnight you may have the following liquids until _ 0515_____ AM DAY OF SURGERY  Water Non-Citrus Juices (without pulp, NO RED-Apple, White grape, White cranberry) Black Coffee (NO MILK/CREAM OR CREAMERS, sugar ok)  Clear Tea (NO MILK/CREAM OR CREAMERS, sugar ok) regular and decaf                             Plain Jell-O (NO RED)                                           Fruit ices (not with fruit pulp, NO RED)                                     Popsicles (NO RED)                                                               Sports drinks like Gatorade (NO RED)                    The day of surgery:  Drink ONE (1) Pre-Surgery Clear Ensure or G2 at  0515 AM ( have completed by )  the morning of surgery. Drink in one sitting. Do not sip.  This drink was given to you during your hospital  pre-op appointment visit. Nothing else to  drink after completing the  Pre-Surgery Clear Ensure or G2.          If you have questions, please contact your surgeon's office.   FOLLOW BOWEL PREP AND ANY ADDITIONAL PRE OP INSTRUCTIONS YOU RECEIVED FROM YOUR SURGEON'S OFFICE!!!     Oral Hygiene is also important to reduce your risk of infection.  Remember - BRUSH YOUR TEETH THE MORNING OF SURGERY WITH YOUR REGULAR TOOTHPASTE  DENTURES WILL BE REMOVED PRIOR TO SURGERY PLEASE DO NOT APPLY "Poly grip" OR ADHESIVES!!!   Do NOT smoke after Midnight   Take these medicines the morning of surgery with A SIP OF WATER:  wellbutrin, celexa, gabapentin, toprol   DO NOT TAKE ANY ORAL DIABETIC MEDICATIONS DAY OF YOUR SURGERY  Bring CPAP mask and tubing day of surgery.                              You may not have any metal on your body including hair pins, jewelry, and body piercing             Do not wear make-up, lotions, powders, perfumes/cologne, or deodorant  Do not wear nail polish including gel and S&S, artificial/acrylic nails, or any other type of covering on natural nails including finger and toenails. If you have artificial nails, gel coating, etc. that needs to be removed by a nail salon please have this removed prior to surgery or surgery may need to be canceled/ delayed if the surgeon/ anesthesia feels like they are unable to be safely monitored.   Do not shave  48 hours prior to surgery.               Men may shave face and neck.   Do not bring valuables to the hospital. [facility] IS NOT             RESPONSIBLE   FOR VALUABLES.   Contacts, glasses, dentures or bridgework may not be worn into surgery.   Bring small overnight bag day of surgery.   DO NOT BRING YOUR HOME MEDICATIONS TO THE HOSPITAL. PHARMACY WILL DISPENSE MEDICATIONS LISTED ON YOUR MEDICATION LIST TO YOU DURING YOUR ADMISSION IN THE HOSPITAL!    Patients discharged on the day of surgery will not be allowed to drive home.   Someone NEEDS to stay with you for the first 24 hours after anesthesia.   Special Instructions: Bring a copy of your healthcare power of attorney and living will documents the day of surgery if you haven't scanned them before.              Please read over the following fact sheets you were given: IF YOU HAVE QUESTIONS ABOUT YOUR PRE-OP INSTRUCTIONS PLEASE CALL [phone]   If you received a COVID test during your pre-op visit  it is requested that you wear a mask when out in public, stay away from anyone that may not be feeling well and notify your surgeon if you develop symptoms. If you test positive for Covid or have been in contact with anyone that has tested positive in the last 10 days please notify you surgeon.      Pre-operative 5 CHG Bath Instructions   You can play a key role in reducing the risk of infection after surgery. Your skin needs to be as free of germs as possible. You can reduce the number of germs on your skin by washing with CHG (chlorhexidine gluconate) soap before surgery. CHG is an antiseptic soap that kills germs and continues to kill germs even after washing.   DO NOT use if you have an allergy to chlorhexidine/CHG or antibacterial soaps. If your skin becomes reddened or irritated, stop using the CHG and notify one of our RNs at [phone].   Please shower with the CHG soap  starting 4 days before surgery using the following schedule:     Please keep in mind the following:  DO NOT shave, including legs and underarms, starting the day of your first shower.   You may shave your face at any point before/day of surgery.  Place clean sheets on your bed the day you start using CHG soap. Use a clean washcloth (not used since being washed) for each shower. DO NOT sleep with pets once you start using the CHG.   CHG Shower Instructions:  If you choose to wash your hair and private area, wash first with your normal shampoo/soap.  After you use shampoo/soap, rinse  your hair and body thoroughly to remove shampoo/soap residue.  Turn the water OFF and apply about 3 tablespoons (45 ml) of CHG soap to a CLEAN washcloth.  Apply CHG soap ONLY FROM YOUR NECK DOWN TO YOUR TOES (washing for 3-5 minutes)  DO NOT use CHG soap on face, private areas, open wounds, or sores.  Pay special attention to the area where your surgery is being performed.  If you are having back surgery, having someone wash your back for you may be helpful. Wait 2 minutes after CHG soap is applied, then you may rinse off the CHG soap.  Pat dry with a clean towel  Put on clean clothes/pajamas   If you choose to wear lotion, please use ONLY the CHG-compatible lotions on the back of this paper.     Additional instructions for the day of surgery: DO NOT APPLY any lotions, deodorants, cologne, or perfumes.   Put on clean/comfortable clothes.  Brush your teeth.  Ask your nurse before applying any prescription medications to the skin.      CHG Compatible Lotions   Aveeno Moisturizing lotion  Cetaphil Moisturizing Cream  Cetaphil Moisturizing Lotion  Clairol Herbal Essence Moisturizing Lotion, Dry Skin  Clairol Herbal Essence Moisturizing Lotion, Extra Dry Skin  Clairol Herbal Essence Moisturizing Lotion, Normal Skin  Curel Age Defying Therapeutic Moisturizing Lotion with Alpha Hydroxy  Curel Extreme Care Body Lotion  Curel Soothing Hands Moisturizing Hand Lotion  Curel Therapeutic Moisturizing Cream, Fragrance-Free  Curel Therapeutic Moisturizing Lotion, Fragrance-Free  Curel Therapeutic Moisturizing Lotion, Original Formula  Eucerin Daily Replenishing Lotion  Eucerin Dry Skin Therapy Plus Alpha Hydroxy Crme  Eucerin Dry Skin Therapy Plus Alpha Hydroxy Lotion  Eucerin Original Crme  Eucerin Original Lotion  Eucerin Plus Crme Eucerin Plus Lotion  Eucerin TriLipid Replenishing Lotion  Keri Anti-Bacterial Hand Lotion  Keri Deep Conditioning Original Lotion Dry Skin Formula Softly  Scented  Keri Deep Conditioning Original Lotion, Fragrance Free Sensitive Skin Formula  Keri Lotion Fast Absorbing Fragrance Free Sensitive Skin Formula  Keri Lotion Fast Absorbing Softly Scented Dry Skin Formula  Keri Original Lotion  Keri Skin Renewal Lotion Keri Silky Smooth Lotion  Keri Silky Smooth Sensitive Skin Lotion  Nivea Body Creamy Conditioning Oil  Nivea Body Extra Enriched Teacher, adult education Moisturizing Lotion Nivea Crme  Nivea Skin Firming Lotion  NutraDerm 30 Skin Lotion  NutraDerm Skin Lotion  NutraDerm Therapeutic Skin Cream  NutraDerm Therapeutic Skin Lotion  ProShield Protective Hand Cream  Provon moisturizing lotion

## 2023-02-02 ENCOUNTER — Ambulatory Visit: Payer: Medicare HMO | Admitting: Cardiology

## 2023-02-06 ENCOUNTER — Other Ambulatory Visit: Payer: Self-pay

## 2023-02-06 ENCOUNTER — Encounter (HOSPITAL_COMMUNITY)
Admission: RE | Admit: 2023-02-06 | Discharge: 2023-02-06 | Disposition: A | Discharge status: Home / Self Care | Payer: 59 | Source: Ambulatory Visit | Attending: Orthopedic Surgery | Admitting: Orthopedic Surgery

## 2023-02-06 ENCOUNTER — Encounter (HOSPITAL_COMMUNITY): Payer: Self-pay

## 2023-02-06 VITALS — BP 146/71 | HR 71 | Temp 98.2°F | Resp 16 | Ht 60.0 in | Wt 160.0 lb

## 2023-02-06 DIAGNOSIS — Z01818 Encounter for other preprocedural examination: Secondary | ICD-10-CM | POA: Insufficient documentation

## 2023-02-06 DIAGNOSIS — I509 Heart failure, unspecified: Secondary | ICD-10-CM | POA: Insufficient documentation

## 2023-02-06 DIAGNOSIS — E119 Type 2 diabetes mellitus without complications: Secondary | ICD-10-CM | POA: Diagnosis not present

## 2023-02-06 DIAGNOSIS — M1712 Unilateral primary osteoarthritis, left knee: Secondary | ICD-10-CM | POA: Diagnosis not present

## 2023-02-06 DIAGNOSIS — I110 Hypertensive heart disease with heart failure: Secondary | ICD-10-CM | POA: Insufficient documentation

## 2023-02-06 HISTORY — DX: Pneumonia, unspecified organism: J18.9

## 2023-02-06 HISTORY — DX: Personal history of urinary calculi: Z87.442

## 2023-02-06 HISTORY — DX: Unspecified osteoarthritis, unspecified site: M19.90

## 2023-02-06 HISTORY — DX: Panic disorder (episodic paroxysmal anxiety): F41.0

## 2023-02-06 HISTORY — DX: Personal history of other diseases of the nervous system and sense organs: Z86.69

## 2023-02-06 LAB — CBC
HCT: 41.6 % (ref 36.0–46.0)
Hemoglobin: 14 g/dL (ref 12.0–15.0)
MCH: 31.6 pg (ref 26.0–34.0)
MCHC: 33.7 g/dL (ref 30.0–36.0)
MCV: 93.9 fL (ref 80.0–100.0)
Platelets: 208 10*3/uL (ref 150–400)
RBC: 4.43 MIL/uL (ref 3.87–5.11)
RDW: 12.5 % (ref 11.5–15.5)
WBC: 7 10*3/uL (ref 4.0–10.5)
nRBC: 0 % (ref 0.0–0.2)

## 2023-02-06 LAB — BASIC METABOLIC PANEL
Anion gap: 12 (ref 5–15)
BUN: 20 mg/dL (ref 8–23)
CO2: 22 mmol/L (ref 22–32)
Calcium: 9.6 mg/dL (ref 8.9–10.3)
Chloride: 102 mmol/L (ref 98–111)
Creatinine, Ser: 1.05 mg/dL — ABNORMAL HIGH (ref 0.44–1.00)
GFR, Estimated: 60 mL/min — ABNORMAL LOW (ref >60)
Glucose, Bld: 197 mg/dL — ABNORMAL HIGH (ref 70–99)
Potassium: 3.9 mmol/L (ref 3.5–5.1)
Sodium: 136 mmol/L (ref 135–145)

## 2023-02-06 LAB — GLUCOSE, CAPILLARY: Glucose-Capillary: 179 mg/dL — ABNORMAL HIGH (ref 70–99)

## 2023-02-06 LAB — SURGICAL PCR SCREEN
MRSA, PCR: NEGATIVE
Staphylococcus aureus: POSITIVE — AB

## 2023-02-07 LAB — HEMOGLOBIN A1C
Hgb A1c MFr Bld: 7 % — ABNORMAL HIGH (ref 4.8–5.6)
Mean Plasma Glucose: 154 mg/dL

## 2023-02-09 NOTE — Progress Notes (Signed)
Anesthesia Chart Review   Case: [id number] Date/Time: 02/19/23 0805   Procedure: TOTAL KNEE ARTHROPLASTY (Left: Knee)   Anesthesia type: Choice   Pre-op diagnosis: left knee osteoarthritis   Location: [facility] ROOM 10 / [facility]   Surgeons: Ollen Gross, MD       DISCUSSION:62 y.o. never smoker with h/o HTN, DM II, sleep apnea, CHF, left knee OA scheduled for above procedure 02/19/2023 with Dr. Ollen Gross.   Prior cardiology preoperative evaluation 01/23/2023, "recent cardiac CTA with moderate coronary disease not flow-limiting.  She may proceed without further cardiac testing.    She can hold her ASA x 5 to 7 days prior to surgery and restart when medically safe to do so" VS: BP (!) 146/71   Pulse 71   Temp 36.8 C (Oral)   Resp 16   Ht 5' (1.524 m)   Wt 72.6 kg   SpO2 95%   BMI 31.25 kg/m   PROVIDERS: Ignatius Specking, MD is PCP   Primary Cardiologist: Olga Millers, MD  LABS: Labs reviewed: Acceptable for surgery. (all labs ordered are listed, but only abnormal results are displayed)  Labs Reviewed  SURGICAL PCR SCREEN - Abnormal; Notable for the following components:      Result Value   Staphylococcus aureus POSITIVE (*)    All other components within normal limits  HEMOGLOBIN A1C - Abnormal; Notable for the following components:   Hgb A1c MFr Bld 7.0 (*)    All other components within normal limits  BASIC METABOLIC PANEL - Abnormal; Notable for the following components:   Glucose, Bld 197 (*)    Creatinine, Ser 1.05 (*)    GFR, Estimated 60 (*)    All other components within normal limits  GLUCOSE, CAPILLARY - Abnormal; Notable for the following components:   Glucose-Capillary 179 (*)    All other components within normal limits  CBC     IMAGES:   EKG:   CV: CT Coronary FFR 01/09/2023 FINDINGS: FFRct analysis was performed on the original cardiac CT angiogram dataset. Diagrammatic representation of the FFRct analysis is provided in a separate PDF document  in PACS. This dictation was created using the PDF document and an interactive 3D model of the results. 3D model is not available in the EMR/PACS. Normal FFR range is >0.80.   1. Left Main: No significant stenosis   2. LAD: CTFFR gradually tapers, falling to 0.70 across distal stenosis, with change in FFR across lesion suggesting no significant stenosis   3. LCX: CTFFR 0.83 across lesion in proximal LCX, suggesting lesion is not functionally significant   4. RCA: CTFFR 0.93 across lesion in proximal RCA, suggesting lesion is not functionally significant   IMPRESSION: 1.  CTFFR suggests nonobstructive CAD   Lexiscan Tetrofosmin stress test 01/25/2022: Lexiscan nuclear stress test performed using 1-day protocol. After injection the patient developed chest pain level 8 of 10, and experienced dyspnea and chest discomfort.  Normal myocardial perfusion. No ischemia to correlate with reported chest pain. Stress LVEF 69%. Low risk study.   Echocardiogram 01/26/2022: Left ventricle cavity is normal in size. Concentric hypertrophy of the left ventricle. Normal global wall motion. Normal LV systolic function with EF 56%. Doppler evidence of grade I (impaired) diastolic dysfunction, normal LAP. No significant valvular abnormality. IVC not seen.   Echo 09/25/2018 - Left ventricle: The cavity size was normal. Wall thickness was    increased in a pattern of mild LVH. Systolic function was normal.    The estimated ejection  fraction was in the range of 60% to 65%.    Wall motion was normal; there were no regional wall motion    abnormalities. Doppler parameters are consistent with abnormal    left ventricular relaxation (grade 1 diastolic dysfunction).  - Aortic valve: Mildly calcified annulus. Trileaflet.  - Mitral valve: Mildly calcified annulus. There was trivial    regurgitation.  - Right atrium: Central venous pressure (est): 3 mm Hg.  - Atrial septum: There was increased thickness of the  septum,    consistent with lipomatous hypertrophy.  - Tricuspid valve: There was trivial regurgitation.  - Pulmonary arteries: Systolic pressure could not be accurately    estimated.  - Pericardium, extracardiac: A prominent pericardial fat pad was    present. Cannot exclude small to moderate sized, organized    circumferential pericardial effusion. Could consider CT imaging.  Past Medical History:  Diagnosis Date   Abnormal EKG    Allergy    Anxiety    PTSD   Arthritis    CHF (congestive heart failure) (HCC)    Depression    Diabetes mellitus without complication (HCC)    GERD (gastroesophageal reflux disease)    History of kidney stones    Hx of absence seizures    last seizure- 1978   Hyperlipidemia    Hypertension    Unspecified   IBS (irritable bowel syndrome)    Panic attacks    severe panic attacks followed by DR Anne Hahn when happens cannot speak   Panic disorder 03/20/2019   Pneumonia    Seizures (HCC)    Reports last seizure was 30 + years ago    Sleep apnea    no CPAP worn   Stroke (HCC)    2004  slight right eye droop    Past Surgical History:  Procedure Laterality Date   HYSTEROSCOPY  2001   submucous myomectomy   TOTAL ABDOMINAL HYSTERECTOMY  2002   TAH BSO  pathology showed leiomyoma, adenomyosis, simple and complex hyperplasia without atypia   TUMOR REMOVAL     from left ear    MEDICATIONS:  ALPRAZolam (XANAX) 1 MG tablet   aspirin 81 MG tablet   atorvastatin (LIPITOR) 80 MG tablet   buPROPion (WELLBUTRIN XL) 150 MG 24 hr tablet   Choline Fenofibrate (FENOFIBRIC ACID) 135 MG CPDR   citalopram (CELEXA) 40 MG tablet   furosemide (LASIX) 20 MG tablet   gabapentin (NEURONTIN) 400 MG capsule   glimepiride (AMARYL) 4 MG tablet   insulin glargine (LANTUS) 100 UNIT/ML injection   JARDIANCE 10 MG TABS tablet   lidocaine (LIDODERM) 5 %   lidocaine (XYLOCAINE) 5 % ointment   methylcellulose (CITRUCEL) oral powder   metoprolol succinate (TOPROL-XL) 25  MG 24 hr tablet   metoprolol tartrate (LOPRESSOR) 100 MG tablet   omeprazole (PRILOSEC) 20 MG capsule   potassium chloride SA (KLOR-CON) 20 MEQ tablet   promethazine (PHENERGAN) 12.5 MG tablet   Semaglutide,0.25 or 0.5MG /DOS, 2 MG/1.5ML SOPN   spironolactone (ALDACTONE) 50 MG tablet   verapamil (CALAN-SR) 120 MG CR tablet   No current facility-administered medications for this encounter.    Jodell Cipro Ward, PA-C [facility] [phone]

## 2023-02-09 NOTE — Anesthesia Preprocedure Evaluation (Addendum)
Anesthesia Evaluation  Patient identified by MRN, date of birth, ID band Patient awake    Reviewed: Allergy & Precautions, H&P , NPO status , Patient's Chart, lab work & pertinent test results  Airway Mallampati: IV  TM Distance: >3 FB Neck ROM: Full    Dental no notable dental hx. (+) Teeth Intact, Dental Advisory Given   Pulmonary neg pulmonary ROS, shortness of breath, sleep apnea , pneumonia   Pulmonary exam normal breath sounds clear to auscultation       Cardiovascular Exercise Tolerance: Good hypertension, Pt. on medications and Pt. on home beta blockers + CAD and +CHF  negative cardio ROS Normal cardiovascular exam Rhythm:Regular Rate:Normal  Lexiscan Tetrofosmin stress test 01/25/2022: Lexiscan nuclear stress test performed using 1-day protocol. After injection the patient developed chest pain level 8 of 10, and experienced dyspnea and chest discomfort.  Normal myocardial perfusion. No ischemia to correlate with reported chest pain. Stress LVEF 69%. Low risk study.    Echocardiogram 01/26/2022: Left ventricle cavity is normal in size. Concentric hypertrophy of the left ventricle. Normal global wall motion. Normal LV systolic function with EF 56%. Doppler evidence of grade I (impaired) diastolic dysfunction, normal LAP. No significant valvular abnormality.     Neuro/Psych Seizures -,  PSYCHIATRIC DISORDERS Anxiety Depression    CVA negative neurological ROS  negative psych ROS   GI/Hepatic negative GI ROS, Neg liver ROS,GERD  Medicated,,  Endo/Other  negative endocrine ROSdiabetes, Type 2    Renal/GU Renal diseasenegative Renal ROS  negative genitourinary   Musculoskeletal negative musculoskeletal ROS (+) Arthritis , Osteoarthritis,    Abdominal   Peds negative pediatric ROS (+)  Hematology negative hematology ROS (+)   Anesthesia Other Findings   Reproductive/Obstetrics negative OB ROS                              Anesthesia Physical Anesthesia Plan  ASA: 3  Anesthesia Plan: MAC, Regional and Spinal   Post-op Pain Management: Regional block*   Induction: Intravenous  PONV Risk Score and Plan: 2 and Ondansetron, Dexamethasone and Treatment may vary due to age or medical condition  Airway Management Planned: Natural Airway  Additional Equipment: None  Intra-op Plan:   Post-operative Plan:   Informed Consent: I have reviewed the patients History and Physical, chart, labs and discussed the procedure including the risks, benefits and alternatives for the proposed anesthesia with the patient or authorized representative who has indicated his/her understanding and acceptance.       Plan Discussed with: Anesthesiologist  Anesthesia Plan Comments: (See PAT note 02/06/2023 DISCUSSION:62 y.o. never smoker with h/o HTN, DM II, sleep apnea, CHF, left knee OA scheduled for above procedure 02/19/2023 with Dr. Ollen Gross.    Prior cardiology preoperative evaluation 01/23/2023, "recent cardiac CTA with moderate coronary disease not flow-limiting.  She may proceed without further cardiac testing.   )       Anesthesia Quick Evaluation

## 2023-02-13 ENCOUNTER — Other Ambulatory Visit: Payer: Self-pay | Admitting: Cardiology

## 2023-02-13 DIAGNOSIS — I5032 Chronic diastolic (congestive) heart failure: Secondary | ICD-10-CM

## 2023-02-13 DIAGNOSIS — I10 Essential (primary) hypertension: Secondary | ICD-10-CM

## 2023-02-19 ENCOUNTER — Encounter (HOSPITAL_COMMUNITY): Payer: Self-pay | Admitting: Orthopedic Surgery

## 2023-02-19 ENCOUNTER — Observation Stay (HOSPITAL_COMMUNITY)
Admission: RE | Admit: 2023-02-19 | Discharge: 2023-02-20 | Disposition: A | Discharge status: Home / Self Care | Payer: 59 | Source: Ambulatory Visit | Attending: Orthopedic Surgery | Admitting: Orthopedic Surgery

## 2023-02-19 ENCOUNTER — Ambulatory Visit (HOSPITAL_COMMUNITY): Payer: 59 | Admitting: Physician Assistant

## 2023-02-19 ENCOUNTER — Encounter (HOSPITAL_COMMUNITY)
Admission: RE | Disposition: A | Discharge status: Home / Self Care | Payer: Self-pay | Source: Ambulatory Visit | Attending: Orthopedic Surgery

## 2023-02-19 ENCOUNTER — Other Ambulatory Visit: Payer: Self-pay

## 2023-02-19 ENCOUNTER — Ambulatory Visit (HOSPITAL_BASED_OUTPATIENT_CLINIC_OR_DEPARTMENT_OTHER): Payer: 59 | Admitting: Certified Registered"

## 2023-02-19 DIAGNOSIS — Z8673 Personal history of transient ischemic attack (TIA), and cerebral infarction without residual deficits: Secondary | ICD-10-CM | POA: Diagnosis not present

## 2023-02-19 DIAGNOSIS — Z794 Long term (current) use of insulin: Secondary | ICD-10-CM | POA: Insufficient documentation

## 2023-02-19 DIAGNOSIS — I110 Hypertensive heart disease with heart failure: Secondary | ICD-10-CM | POA: Insufficient documentation

## 2023-02-19 DIAGNOSIS — E1140 Type 2 diabetes mellitus with diabetic neuropathy, unspecified: Secondary | ICD-10-CM

## 2023-02-19 DIAGNOSIS — I2510 Atherosclerotic heart disease of native coronary artery without angina pectoris: Secondary | ICD-10-CM

## 2023-02-19 DIAGNOSIS — Z01818 Encounter for other preprocedural examination: Secondary | ICD-10-CM

## 2023-02-19 DIAGNOSIS — E119 Type 2 diabetes mellitus without complications: Secondary | ICD-10-CM | POA: Diagnosis not present

## 2023-02-19 DIAGNOSIS — Z7982 Long term (current) use of aspirin: Secondary | ICD-10-CM | POA: Insufficient documentation

## 2023-02-19 DIAGNOSIS — M1712 Unilateral primary osteoarthritis, left knee: Secondary | ICD-10-CM

## 2023-02-19 DIAGNOSIS — Z79899 Other long term (current) drug therapy: Secondary | ICD-10-CM | POA: Diagnosis not present

## 2023-02-19 DIAGNOSIS — I5032 Chronic diastolic (congestive) heart failure: Secondary | ICD-10-CM | POA: Diagnosis not present

## 2023-02-19 DIAGNOSIS — I509 Heart failure, unspecified: Secondary | ICD-10-CM | POA: Diagnosis not present

## 2023-02-19 DIAGNOSIS — M179 Osteoarthritis of knee, unspecified: Secondary | ICD-10-CM | POA: Diagnosis present

## 2023-02-19 HISTORY — PX: TOTAL KNEE ARTHROPLASTY: SHX125

## 2023-02-19 LAB — GLUCOSE, CAPILLARY
Glucose-Capillary: 286 mg/dL — ABNORMAL HIGH (ref 70–99)
Glucose-Capillary: 153 mg/dL — ABNORMAL HIGH (ref 70–99)
Glucose-Capillary: 189 mg/dL — ABNORMAL HIGH (ref 70–99)
Glucose-Capillary: 250 mg/dL — ABNORMAL HIGH (ref 70–99)
Glucose-Capillary: 235 mg/dL — ABNORMAL HIGH (ref 70–99)

## 2023-02-19 SURGERY — ARTHROPLASTY, KNEE, TOTAL
Anesthesia: Monitor Anesthesia Care | Site: Knee | Laterality: Left

## 2023-02-19 MED ORDER — ONDANSETRON HCL 4 MG/2ML IJ SOLN
INTRAMUSCULAR | Status: DC | PRN
  Administered 2023-02-19: 4 mg via INTRAVENOUS

## 2023-02-19 MED ORDER — SODIUM CHLORIDE 0.9 % IR SOLN
Status: DC | PRN
  Administered 2023-02-19: 1000 mL

## 2023-02-19 MED ORDER — ACETAMINOPHEN 500 MG PO TABS
1000.0000 mg | ORAL_TABLET | Freq: Four times a day (QID) | ORAL | Status: AC
  Administered 2023-02-19 – 2023-02-20 (×4): 1000 mg via ORAL
  Filled 2023-02-19 (×4): qty 2

## 2023-02-19 MED ORDER — LACTATED RINGERS IV SOLN: INTRAVENOUS | Status: DC

## 2023-02-19 MED ORDER — ORAL CARE MOUTH RINSE: 15.0000 mL | Freq: Once | OROMUCOSAL | Status: AC

## 2023-02-19 MED ORDER — BISACODYL 10 MG RE SUPP: 10.0000 mg | Freq: Every day | RECTAL | Status: DC | PRN

## 2023-02-19 MED ORDER — DIPHENHYDRAMINE HCL 12.5 MG/5ML PO ELIX: 12.5000 mg | ORAL_SOLUTION | ORAL | Status: DC | PRN

## 2023-02-19 MED ORDER — INSULIN ASPART 100 UNIT/ML IJ SOLN
0.0000 [IU] | Freq: Three times a day (TID) | INTRAMUSCULAR | Status: DC
  Administered 2023-02-19: 3 [IU] via SUBCUTANEOUS
  Administered 2023-02-19 – 2023-02-20 (×2): 5 [IU] via SUBCUTANEOUS
  Administered 2023-02-20: 2 [IU] via SUBCUTANEOUS

## 2023-02-19 MED ORDER — METHOCARBAMOL 1000 MG/10ML IJ SOLN: 500.0000 mg | Freq: Four times a day (QID) | INTRAVENOUS | Status: DC | PRN

## 2023-02-19 MED ORDER — METHOCARBAMOL 500 MG PO TABS
500.0000 mg | ORAL_TABLET | Freq: Four times a day (QID) | ORAL | Status: DC | PRN
  Administered 2023-02-19 – 2023-02-20 (×3): 500 mg via ORAL
  Filled 2023-02-19 (×3): qty 1

## 2023-02-19 MED ORDER — PANTOPRAZOLE SODIUM 40 MG PO TBEC
40.0000 mg | DELAYED_RELEASE_TABLET | Freq: Every day | ORAL | Status: DC
  Administered 2023-02-19 – 2023-02-20 (×2): 40 mg via ORAL
  Filled 2023-02-19 (×2): qty 1

## 2023-02-19 MED ORDER — ROPIVACAINE HCL 5 MG/ML IJ SOLN
INTRAMUSCULAR | Status: DC | PRN
  Administered 2023-02-19: 20 mL via PERINEURAL

## 2023-02-19 MED ORDER — HYDROMORPHONE HCL 1 MG/ML IJ SOLN: 0.5000 mg | INTRAMUSCULAR | Status: DC | PRN

## 2023-02-19 MED ORDER — ALPRAZOLAM 0.5 MG PO TABS
1.0000 mg | ORAL_TABLET | Freq: Every evening | ORAL | Status: DC | PRN
  Administered 2023-02-20: 1 mg via ORAL
  Filled 2023-02-19: qty 2

## 2023-02-19 MED ORDER — ONDANSETRON HCL 4 MG/2ML IJ SOLN: 4.0000 mg | Freq: Once | INTRAMUSCULAR | Status: DC | PRN

## 2023-02-19 MED ORDER — POVIDONE-IODINE 10 % EX SWAB: 2.0000 | Freq: Once | CUTANEOUS | Status: DC

## 2023-02-19 MED ORDER — ACETAMINOPHEN 160 MG/5ML PO SOLN: 325.0000 mg | ORAL | Status: DC | PRN

## 2023-02-19 MED ORDER — METOCLOPRAMIDE HCL 5 MG/ML IJ SOLN: 5.0000 mg | Freq: Three times a day (TID) | INTRAMUSCULAR | Status: DC | PRN

## 2023-02-19 MED ORDER — FENTANYL CITRATE PF 50 MCG/ML IJ SOSY: 25.0000 ug | PREFILLED_SYRINGE | INTRAMUSCULAR | Status: DC | PRN

## 2023-02-19 MED ORDER — CEFAZOLIN SODIUM-DEXTROSE 2-4 GM/100ML-% IV SOLN
2.0000 g | INTRAVENOUS | Status: AC
  Administered 2023-02-19: 2 g via INTRAVENOUS
  Filled 2023-02-19: qty 100

## 2023-02-19 MED ORDER — SPIRONOLACTONE 25 MG PO TABS
50.0000 mg | ORAL_TABLET | Freq: Every day | ORAL | Status: DC
  Administered 2023-02-20: 50 mg via ORAL
  Filled 2023-02-19: qty 2

## 2023-02-19 MED ORDER — ONDANSETRON HCL 4 MG/2ML IJ SOLN: 4.0000 mg | Freq: Four times a day (QID) | INTRAMUSCULAR | Status: DC | PRN

## 2023-02-19 MED ORDER — MENTHOL 3 MG MT LOZG: 1.0000 | LOZENGE | OROMUCOSAL | Status: DC | PRN

## 2023-02-19 MED ORDER — INSULIN GLARGINE-YFGN 100 UNIT/ML ~~LOC~~ SOLN
73.0000 [IU] | Freq: Every day | SUBCUTANEOUS | Status: DC
  Filled 2023-02-19: qty 0.73

## 2023-02-19 MED ORDER — CEFAZOLIN SODIUM-DEXTROSE 2-4 GM/100ML-% IV SOLN
2.0000 g | Freq: Four times a day (QID) | INTRAVENOUS | Status: AC
  Administered 2023-02-19 (×2): 2 g via INTRAVENOUS
  Filled 2023-02-19 (×2): qty 100

## 2023-02-19 MED ORDER — 0.9 % SODIUM CHLORIDE (POUR BTL) OPTIME
TOPICAL | Status: DC | PRN
  Administered 2023-02-19: 1000 mL

## 2023-02-19 MED ORDER — OXYCODONE HCL 5 MG PO TABS: 5.0000 mg | ORAL_TABLET | Freq: Once | ORAL | Status: DC | PRN

## 2023-02-19 MED ORDER — BUPIVACAINE LIPOSOME 1.3 % IJ SUSP
INTRAMUSCULAR | Status: AC
  Filled 2023-02-19: qty 20

## 2023-02-19 MED ORDER — INSULIN ASPART 100 UNIT/ML IJ SOLN
0.0000 [IU] | Freq: Every day | INTRAMUSCULAR | Status: DC
  Administered 2023-02-19: 2 [IU] via SUBCUTANEOUS

## 2023-02-19 MED ORDER — OXYCODONE HCL 5 MG/5ML PO SOLN: 5.0000 mg | Freq: Once | ORAL | Status: DC | PRN

## 2023-02-19 MED ORDER — TRANEXAMIC ACID-NACL 1000-0.7 MG/100ML-% IV SOLN
1000.0000 mg | INTRAVENOUS | Status: AC
  Administered 2023-02-19: 1000 mg via INTRAVENOUS
  Filled 2023-02-19: qty 100

## 2023-02-19 MED ORDER — BUPIVACAINE IN DEXTROSE 0.75-8.25 % IT SOLN
INTRATHECAL | Status: DC | PRN
  Administered 2023-02-19: 1.6 mL via INTRATHECAL

## 2023-02-19 MED ORDER — MIDAZOLAM HCL 2 MG/2ML IJ SOLN
1.0000 mg | INTRAMUSCULAR | Status: DC
  Administered 2023-02-19: 2 mg via INTRAVENOUS
  Filled 2023-02-19: qty 2

## 2023-02-19 MED ORDER — GLIMEPIRIDE 4 MG PO TABS
4.0000 mg | ORAL_TABLET | Freq: Every day | ORAL | Status: DC
  Administered 2023-02-20: 4 mg via ORAL
  Filled 2023-02-19: qty 1

## 2023-02-19 MED ORDER — ONDANSETRON HCL 4 MG PO TABS: 4.0000 mg | ORAL_TABLET | Freq: Four times a day (QID) | ORAL | Status: DC | PRN

## 2023-02-19 MED ORDER — POLYETHYLENE GLYCOL 3350 17 G PO PACK: 17.0000 g | PACK | Freq: Every day | ORAL | Status: DC | PRN

## 2023-02-19 MED ORDER — PHENOL 1.4 % MT LIQD: 1.0000 | OROMUCOSAL | Status: DC | PRN

## 2023-02-19 MED ORDER — GABAPENTIN 400 MG PO CAPS
400.0000 mg | ORAL_CAPSULE | Freq: Two times a day (BID) | ORAL | Status: DC
  Administered 2023-02-19 – 2023-02-20 (×2): 400 mg via ORAL
  Filled 2023-02-19 (×2): qty 1

## 2023-02-19 MED ORDER — PROPOFOL 500 MG/50ML IV EMUL
INTRAVENOUS | Status: DC | PRN
  Administered 2023-02-19: 100 ug/kg/min via INTRAVENOUS

## 2023-02-19 MED ORDER — RIVAROXABAN 10 MG PO TABS
10.0000 mg | ORAL_TABLET | Freq: Every day | ORAL | Status: DC
  Administered 2023-02-20: 10 mg via ORAL
  Filled 2023-02-19: qty 1

## 2023-02-19 MED ORDER — SODIUM CHLORIDE (PF) 0.9 % IJ SOLN
INTRAMUSCULAR | Status: AC
  Filled 2023-02-19: qty 10

## 2023-02-19 MED ORDER — HYDROMORPHONE HCL 2 MG PO TABS
2.0000 mg | ORAL_TABLET | ORAL | Status: DC | PRN
  Administered 2023-02-19 – 2023-02-20 (×6): 2 mg via ORAL
  Filled 2023-02-19 (×6): qty 1

## 2023-02-19 MED ORDER — FLEET ENEMA 7-19 GM/118ML RE ENEM: 1.0000 | ENEMA | Freq: Once | RECTAL | Status: DC | PRN

## 2023-02-19 MED ORDER — ACETAMINOPHEN 10 MG/ML IV SOLN
1000.0000 mg | Freq: Four times a day (QID) | INTRAVENOUS | Status: DC
  Administered 2023-02-19: 1000 mg via INTRAVENOUS
  Filled 2023-02-19: qty 100

## 2023-02-19 MED ORDER — SODIUM CHLORIDE 0.9 % IV SOLN
INTRAVENOUS | Status: DC | PRN
  Administered 2023-02-19: 80 mL

## 2023-02-19 MED ORDER — CHLORHEXIDINE GLUCONATE 0.12 % MT SOLN
15.0000 mL | Freq: Once | OROMUCOSAL | Status: AC
  Administered 2023-02-19: 15 mL via OROMUCOSAL

## 2023-02-19 MED ORDER — BUPIVACAINE LIPOSOME 1.3 % IJ SUSP: 20.0000 mL | Freq: Once | INTRAMUSCULAR | Status: DC

## 2023-02-19 MED ORDER — FENTANYL CITRATE PF 50 MCG/ML IJ SOSY
50.0000 ug | PREFILLED_SYRINGE | INTRAMUSCULAR | Status: DC
  Administered 2023-02-19: 50 ug via INTRAVENOUS
  Filled 2023-02-19: qty 2

## 2023-02-19 MED ORDER — DOCUSATE SODIUM 100 MG PO CAPS
100.0000 mg | ORAL_CAPSULE | Freq: Two times a day (BID) | ORAL | Status: DC
  Administered 2023-02-19 – 2023-02-20 (×3): 100 mg via ORAL
  Filled 2023-02-19 (×3): qty 1

## 2023-02-19 MED ORDER — DEXAMETHASONE SODIUM PHOSPHATE 10 MG/ML IJ SOLN
8.0000 mg | Freq: Once | INTRAMUSCULAR | Status: AC
  Administered 2023-02-19: 8 mg via INTRAVENOUS

## 2023-02-19 MED ORDER — METOPROLOL SUCCINATE ER 25 MG PO TB24
25.0000 mg | ORAL_TABLET | Freq: Every day | ORAL | Status: DC
  Administered 2023-02-20: 25 mg via ORAL
  Filled 2023-02-19: qty 1

## 2023-02-19 MED ORDER — SODIUM CHLORIDE (PF) 0.9 % IJ SOLN
INTRAMUSCULAR | Status: AC
  Filled 2023-02-19: qty 50

## 2023-02-19 MED ORDER — CITALOPRAM HYDROBROMIDE 20 MG PO TABS
40.0000 mg | ORAL_TABLET | Freq: Every day | ORAL | Status: DC
  Administered 2023-02-20: 40 mg via ORAL
  Filled 2023-02-19: qty 2

## 2023-02-19 MED ORDER — PROPOFOL 1000 MG/100ML IV EMUL
INTRAVENOUS | Status: AC
  Filled 2023-02-19: qty 100

## 2023-02-19 MED ORDER — PHENYLEPHRINE HCL (PRESSORS) 10 MG/ML IV SOLN
INTRAVENOUS | Status: DC | PRN
  Administered 2023-02-19: 80 ug via INTRAVENOUS

## 2023-02-19 MED ORDER — MEPERIDINE HCL 50 MG/ML IJ SOLN: 6.2500 mg | INTRAMUSCULAR | Status: DC | PRN

## 2023-02-19 MED ORDER — METOCLOPRAMIDE HCL 5 MG PO TABS: 5.0000 mg | ORAL_TABLET | Freq: Three times a day (TID) | ORAL | Status: DC | PRN

## 2023-02-19 MED ORDER — ACETAMINOPHEN 325 MG PO TABS: 325.0000 mg | ORAL_TABLET | ORAL | Status: DC | PRN

## 2023-02-19 MED ORDER — PROPOFOL 10 MG/ML IV BOLUS
INTRAVENOUS | Status: DC | PRN
  Administered 2023-02-19: 20 mg via INTRAVENOUS

## 2023-02-19 MED ORDER — SODIUM CHLORIDE 0.9 % IV SOLN: INTRAVENOUS | Status: DC

## 2023-02-19 MED ORDER — BUPROPION HCL ER (XL) 150 MG PO TB24
150.0000 mg | ORAL_TABLET | Freq: Every morning | ORAL | Status: DC
  Administered 2023-02-20: 150 mg via ORAL
  Filled 2023-02-19: qty 1

## 2023-02-19 MED ORDER — ATORVASTATIN CALCIUM 40 MG PO TABS: 80.0000 mg | ORAL_TABLET | Freq: Every day | ORAL | Status: DC

## 2023-02-19 SURGICAL SUPPLY — 58 items
ADH SKN CLS APL DERMABOND .7 (GAUZE/BANDAGES/DRESSINGS) ×1
ATTUNE PSFEM LTSZ4 NARCEM KNEE (Femur) IMPLANT
ATTUNE PSRP INSR SZ4 7 KNEE (Insert) IMPLANT
BAG COUNTER SPONGE SURGICOUNT (BAG) IMPLANT
BAG SPEC THK2 15X12 ZIP CLS (MISCELLANEOUS) ×1
BAG SPNG CNTER NS LX DISP (BAG)
BAG ZIPLOCK 12X15 (MISCELLANEOUS) ×1 IMPLANT
BASE TIBIAL ROT PLAT SZ 3 KNEE (Knees) IMPLANT
BLADE SAG 18X100X1.27 (BLADE) ×1 IMPLANT
BLADE SAW SGTL 11.0X1.19X90.0M (BLADE) ×1 IMPLANT
BNDG CMPR 5X62 HK CLSR LF (GAUZE/BANDAGES/DRESSINGS) ×1
BNDG CMPR MED 10X6 ELC LF (GAUZE/BANDAGES/DRESSINGS) ×1
BNDG ELASTIC 6INX 5YD STR LF (GAUZE/BANDAGES/DRESSINGS) ×1 IMPLANT
BNDG ELASTIC 6X10 VLCR STRL LF (GAUZE/BANDAGES/DRESSINGS) IMPLANT
BOWL SMART MIX CTS (DISPOSABLE) ×1 IMPLANT
BSPLAT TIB 3 CMNT ROT PLAT STR (Knees) ×1 IMPLANT
CEMENT HV SMART SET (Cement) ×2 IMPLANT
COVER SURGICAL LIGHT HANDLE (MISCELLANEOUS) ×1 IMPLANT
CUFF TOURN SGL QUICK 34 (TOURNIQUET CUFF) ×1
CUFF TRNQT CYL 34X4.125X (TOURNIQUET CUFF) ×1 IMPLANT
DERMABOND ADVANCED .7 DNX12 (GAUZE/BANDAGES/DRESSINGS) ×1 IMPLANT
DRAPE INCISE IOBAN 66X45 STRL (DRAPES) ×1 IMPLANT
DRAPE U-SHAPE 47X51 STRL (DRAPES) ×1 IMPLANT
DRSG AQUACEL AG ADV 3.5X10 (GAUZE/BANDAGES/DRESSINGS) ×1 IMPLANT
DURAPREP 26ML APPLICATOR (WOUND CARE) ×1 IMPLANT
ELECT REM PT RETURN 15FT ADLT (MISCELLANEOUS) ×1 IMPLANT
GLOVE BIO SURGEON STRL SZ 6.5 (GLOVE) IMPLANT
GLOVE BIO SURGEON STRL SZ8 (GLOVE) ×1 IMPLANT
GLOVE BIOGEL PI IND STRL 6.5 (GLOVE) IMPLANT
GLOVE BIOGEL PI IND STRL 7.0 (GLOVE) IMPLANT
GLOVE BIOGEL PI IND STRL 8 (GLOVE) ×1 IMPLANT
GOWN STRL REUS W/ TWL LRG LVL3 (GOWN DISPOSABLE) ×1 IMPLANT
GOWN STRL REUS W/TWL LRG LVL3 (GOWN DISPOSABLE) ×1
HANDPIECE INTERPULSE COAX TIP (DISPOSABLE) ×1
HOLDER FOLEY CATH W/STRAP (MISCELLANEOUS) IMPLANT
IMMOBILIZER KNEE 20 (SOFTGOODS) ×1
IMMOBILIZER KNEE 20 THIGH 36 (SOFTGOODS) ×1 IMPLANT
KIT TURNOVER KIT A (KITS) IMPLANT
MANIFOLD NEPTUNE II (INSTRUMENTS) ×1 IMPLANT
NS IRRIG 1000ML POUR BTL (IV SOLUTION) ×1 IMPLANT
PACK TOTAL KNEE CUSTOM (KITS) ×1 IMPLANT
PADDING CAST ABS COTTON 6X4 NS (CAST SUPPLIES) IMPLANT
PADDING CAST COTTON 6X4 STRL (CAST SUPPLIES) ×2 IMPLANT
PATELLA MEDIAL ATTUN 35MM KNEE (Knees) IMPLANT
PIN STEINMAN FIXATION KNEE (PIN) IMPLANT
PROTECTOR NERVE ULNAR (MISCELLANEOUS) ×1 IMPLANT
SET HNDPC FAN SPRY TIP SCT (DISPOSABLE) ×1 IMPLANT
SPIKE FLUID TRANSFER (MISCELLANEOUS) ×1 IMPLANT
SUT MNCRL AB 4-0 PS2 18 (SUTURE) ×1 IMPLANT
SUT STRATAFIX 0 PDS 27 VIOLET (SUTURE) ×1
SUT VIC AB 2-0 CT1 27 (SUTURE) ×3
SUT VIC AB 2-0 CT1 TAPERPNT 27 (SUTURE) ×3 IMPLANT
SUTURE STRATFX 0 PDS 27 VIOLET (SUTURE) ×1 IMPLANT
TIBIAL BASE ROT PLAT SZ 3 KNEE (Knees) ×1 IMPLANT
TRAY FOLEY MTR SLVR 16FR STAT (SET/KITS/TRAYS/PACK) ×1 IMPLANT
TUBE SUCTION HIGH CAP CLEAR NV (SUCTIONS) ×1 IMPLANT
WATER STERILE IRR 1000ML POUR (IV SOLUTION) ×2 IMPLANT
WRAP KNEE MAXI GEL POST OP (GAUZE/BANDAGES/DRESSINGS) ×1 IMPLANT

## 2023-02-19 NOTE — Transfer of Care (Signed)
Immediate Anesthesia Transfer of Care Note  Patient: Shahd A Campoverde  Procedure(s) Performed: TOTAL KNEE ARTHROPLASTY (Left: Knee)  Patient Location: PACU  Anesthesia Type:MAC combined with regional for post-op pain  Level of Consciousness: sedated  Airway & Oxygen Therapy: Patient Spontanous Breathing and Patient connected to face mask oxygen  Post-op Assessment: Report given to RN and Post -op Vital signs reviewed and stable  Post vital signs: Reviewed and stable  Last Vitals:  Vitals Value Taken Time  BP 102/47 02/19/23 0949  Temp 36.4 C 02/19/23 0949  Pulse 62 02/19/23 0954  Resp 14 02/19/23 0954  SpO2 99 % 02/19/23 0954  Vitals shown include unvalidated device data.  Last Pain:  Vitals:   02/19/23 0949  TempSrc:   PainSc: Asleep      Patients Stated Pain Goal: 4 (02/19/23 9604)  Complications: No notable events documented.

## 2023-02-19 NOTE — Discharge Instructions (Addendum)
Information on my medicine - XARELTO (Rivaroxaban)  This medication education was reviewed with me or my healthcare representative as part of my discharge preparation.   Why was Xarelto prescribed for you? Xarelto was prescribed for you to reduce the risk of blood clots forming after orthopedic surgery. The medical term for these abnormal blood clots is venous thromboembolism (VTE).  What do you need to know about xarelto ? Take your Xarelto ONCE DAILY at the same time every day. You may take it either with or without food.  If you have difficulty swallowing the tablet whole, you may crush it and mix in applesauce just prior to taking your dose.  Take Xarelto exactly as prescribed by your doctor and DO NOT stop taking Xarelto without talking to the doctor who prescribed the medication.  Stopping without other VTE prevention medication to take the place of Xarelto may increase your risk of developing a clot.  After discharge, you should have regular check-up appointments with your healthcare provider that is prescribing your Xarelto.    What do you do if you miss a dose? If you miss a dose, take it as soon as you remember on the same day then continue your regularly scheduled once daily regimen the next day. Do not take two doses of Xarelto on the same day.   Important Safety Information A possible side effect of Xarelto is bleeding. You should call your healthcare provider right away if you experience any of the following: Bleeding from an injury or your nose that does not stop. Unusual colored urine (red or dark brown) or unusual colored stools (red or black). Unusual bruising for unknown reasons. A serious fall or if you hit your head (even if there is no bleeding).  Some medicines may interact with Xarelto and might increase your risk of bleeding while on Xarelto. To help avoid this, consult your healthcare provider or pharmacist prior to using any new prescription or  non-prescription medications, including herbals, vitamins, non-steroidal anti-inflammatory drugs (NSAIDs) and supplements.  This website has more information on Xarelto: VisitDestination.com.br.    Ollen Gross, MD Total Joint Specialist [facility] [place] [address], [address] [place], [place] [address] [phone]  TOTAL KNEE REPLACEMENT POSTOPERATIVE DIRECTIONS    Knee Rehabilitation, Guidelines Following Surgery  Results after knee surgery are often greatly improved when you follow the exercise, range of motion and muscle strengthening exercises prescribed by your doctor. Safety measures are also important to protect the knee from further injury. If any of these exercises cause you to have increased pain or swelling in your knee joint, decrease the amount until you are comfortable again and slowly increase them. If you have problems or questions, call your caregiver or physical therapist for advice.   BLOOD CLOT PREVENTION Take a 10 mg Xarelto once a day for three weeks following surgery. Then resume one 81 mg aspirin once a day You may resume your vitamins/supplements once you have discontinued the Xarelto. Do not take any NSAIDs (Advil, Aleve, Ibuprofen, Meloxicam, etc.) until you have discontinued the Xarelto.   HOME CARE INSTRUCTIONS  Remove items at home which could result in a fall. This includes throw rugs or furniture in walking pathways.  ICE to the affected knee as much as tolerated. Icing helps control swelling. If the swelling is well controlled you will be more comfortable and rehab easier. Continue to use ice on the knee for pain and swelling from surgery. You may notice swelling that will progress down to  the foot and ankle. This is normal after surgery. Elevate the leg when you are not up walking on it.    Continue to use the breathing machine which will help keep your temperature down. It is common for your temperature to cycle up and down following surgery,  especially at night when you are not up moving around and exerting yourself. The breathing machine keeps your lungs expanded and your temperature down. Do not place pillow under the operative knee, focus on keeping the knee straight while resting  DIET You may resume your previous home diet once you are discharged from the hospital.  DRESSING / WOUND CARE / SHOWERING Keep your bulky bandage on for 2 days. On the third post-operative day you may remove the Ace bandage and gauze. There is a waterproof adhesive bandage on your skin which will stay in place until your first follow-up appointment. Once you remove this you will not need to place another bandage You may begin showering 3 days following surgery, but do not submerge the incision under water.  ACTIVITY For the first 5 days, the key is rest and control of pain and swelling Do your home exercises twice a day starting on post-operative day 3. On the days you go to physical therapy, just do the home exercises once that day. You should rest, ice and elevate the leg for 50 minutes out of every hour. Get up and walk/stretch for 10 minutes per hour. After 5 days you can increase your activity slowly as tolerated. Walk with your walker as instructed. Use the walker until you are comfortable transitioning to a cane. Walk with the cane in the opposite hand of the operative leg. You may discontinue the cane once you are comfortable and walking steadily. Avoid periods of inactivity such as sitting longer than an hour when not asleep. This helps prevent blood clots.  You may discontinue the knee immobilizer once you are able to perform a straight leg raise while lying down. You may resume a sexual relationship in one month or when given the OK by your doctor.  You may return to work once you are cleared by your doctor.  Do not drive a car for 6 weeks or until released by your surgeon.  Do not drive while taking narcotics.  TED HOSE STOCKINGS Wear the  elastic stockings on both legs for three weeks following surgery during the day. You may remove them at night for sleeping.  WEIGHT BEARING Weight bearing as tolerated with assist device (walker, cane, etc) as directed, use it as long as suggested by your surgeon or therapist, typically at least 4-6 weeks.  POSTOPERATIVE CONSTIPATION PROTOCOL Constipation - defined medically as fewer than three stools per week and severe constipation as less than one stool per week.  One of the most common issues patients have following surgery is constipation.  Even if you have a regular bowel pattern at home, your normal regimen is likely to be disrupted due to multiple reasons following surgery.  Combination of anesthesia, postoperative narcotics, change in appetite and fluid intake all can affect your bowels.  In order to avoid complications following surgery, here are some recommendations in order to help you during your recovery period.  Colace (docusate) - Pick up an over-the-counter form of Colace or another stool softener and take twice a day as long as you are requiring postoperative pain medications.  Take with a full glass of water daily.  If you experience loose stools or diarrhea, hold  the colace until you stool forms back up. If your symptoms do not get better within 1 week or if they get worse, check with your doctor. Dulcolax (bisacodyl) - Pick up over-the-counter and take as directed by the product packaging as needed to assist with the movement of your bowels.  Take with a full glass of water.  Use this product as needed if not relieved by Colace only.  MiraLax (polyethylene glycol) - Pick up over-the-counter to have on hand. MiraLax is a solution that will increase the amount of water in your bowels to assist with bowel movements.  Take as directed and can mix with a glass of water, juice, soda, coffee, or tea. Take if you go more than two days without a movement. Do not use MiraLax more than once per  day. Call your doctor if you are still constipated or irregular after using this medication for 7 days in a row.  If you continue to have problems with postoperative constipation, please contact the office for further assistance and recommendations.  If you experience "the worst abdominal pain ever" or develop nausea or vomiting, please contact the office immediatly for further recommendations for treatment.  ITCHING If you experience itching with your medications, try taking only a single pain pill, or even half a pain pill at a time.  You can also use Benadryl over the counter for itching or also to help with sleep.   MEDICATIONS See your medication summary on the "After Visit Summary" that the nursing staff will review with you prior to discharge.  You may have some home medications which will be placed on hold until you complete the course of blood thinner medication.  It is important for you to complete the blood thinner medication as prescribed by your surgeon.  Continue your approved medications as instructed at time of discharge.  PRECAUTIONS If you experience chest pain or shortness of breath - call 911 immediately for transfer to the hospital emergency department.  If you develop a fever greater that 101 F, purulent drainage from wound, increased redness or drainage from wound, foul odor from the wound/dressing, or calf pain - CONTACT YOUR SURGEON.                                                   FOLLOW-UP APPOINTMENTS Make sure you keep all of your appointments after your operation with your surgeon and caregivers. You should call the office at the above phone number and make an appointment for approximately two weeks after the date of your surgery or on the date instructed by your surgeon outlined in the "After Visit Summary".  RANGE OF MOTION AND STRENGTHENING EXERCISES  Rehabilitation of the knee is important following a knee injury or an operation. After just a few days of  immobilization, the muscles of the thigh which control the knee become weakened and shrink (atrophy). Knee exercises are designed to build up the tone and strength of the thigh muscles and to improve knee motion. Often times heat used for twenty to thirty minutes before working out will loosen up your tissues and help with improving the range of motion but do not use heat for the first two weeks following surgery. These exercises can be done on a training (exercise) mat, on the floor, on a table or on a bed. Use what  ever works the best and is most comfortable for you Knee exercises include:  Leg Lifts - While your knee is still immobilized in a splint or cast, you can do straight leg raises. Lift the leg to 60 degrees, hold for 3 sec, and slowly lower the leg. Repeat 10-20 times 2-3 times daily. Perform this exercise against resistance later as your knee gets better.  Quad and Hamstring Sets - Tighten up the muscle on the front of the thigh (Quad) and hold for 5-10 sec. Repeat this 10-20 times hourly. Hamstring sets are done by pushing the foot backward against an object and holding for 5-10 sec. Repeat as with quad sets.  Leg Slides: Lying on your back, slowly slide your foot toward your buttocks, bending your knee up off the floor (only go as far as is comfortable). Then slowly slide your foot back down until your leg is flat on the floor again. Angel Wings: Lying on your back spread your legs to the side as far apart as you can without causing discomfort.  A rehabilitation program following serious knee injuries can speed recovery and prevent re-injury in the future due to weakened muscles. Contact your doctor or a physical therapist for more information on knee rehabilitation.   POST-OPERATIVE OPIOID TAPER INSTRUCTIONS: It is important to wean off of your opioid medication as soon as possible. If you do not need pain medication after your surgery it is ok to stop day one. Opioids include: Codeine,  Hydrocodone(Norco, Vicodin), Oxycodone(Percocet, oxycontin) and hydromorphone amongst others.  Long term and even short term use of opiods can cause: Increased pain response Dependence Constipation Depression Respiratory depression And more.  Withdrawal symptoms can include Flu like symptoms Nausea, vomiting And more Techniques to manage these symptoms Hydrate well Eat regular healthy meals Stay active Use relaxation techniques(deep breathing, meditating, yoga) Do Not substitute Alcohol to help with tapering If you have been on opioids for less than two weeks and do not have pain than it is ok to stop all together.  Plan to wean off of opioids This plan should start within one week post op of your joint replacement. Maintain the same interval or time between taking each dose and first decrease the dose.  Cut the total daily intake of opioids by one tablet each day Next start to increase the time between doses. The last dose that should be eliminated is the evening dose.   IF YOU ARE TRANSFERRED TO A SKILLED REHAB FACILITY If the patient is transferred to a skilled rehab facility following release from the hospital, a list of the current medications will be sent to the facility for the patient to continue.  When discharged from the skilled rehab facility, please have the facility set up the patient's Home Health Physical Therapy prior to being released. Also, the skilled facility will be responsible for providing the patient with their medications at time of release from the facility to include their pain medication, the muscle relaxants, and their blood thinner medication. If the patient is still at the rehab facility at time of the two week follow up appointment, the skilled rehab facility will also need to assist the patient in arranging follow up appointment in our office and any transportation needs.  MAKE SURE YOU:  Understand these instructions.  Get help right away if you are  not doing well or get worse.   DENTAL ANTIBIOTICS:  In most cases prophylactic antibiotics for Dental procdeures after total joint surgery are not necessary.  Exceptions are as follows:  1. History of prior total joint infection  2. Severely immunocompromised (Organ Transplant, cancer chemotherapy, Rheumatoid biologic meds such as Humera)  3. Poorly controlled diabetes (A1C &gt; 8.0, blood glucose over 200)  If you have one of these conditions, contact your surgeon for an antibiotic prescription, prior to your dental procedure.    Pick up stool softner and laxative for home use following surgery while on pain medications. Do not submerge incision under water. Please use good hand washing techniques while changing dressing each day. May shower starting three days after surgery. Please use a clean towel to pat the incision dry following showers. Continue to use ice for pain and swelling after surgery. Do not use any lotions or creams on the incision until instructed by your surgeon.

## 2023-02-19 NOTE — Plan of Care (Signed)
  Problem: Education: Goal: Knowledge of the prescribed therapeutic regimen will improve Outcome: Progressing   Problem: Activity: Goal: Range of joint motion will improve Outcome: Progressing   Problem: Pain Management: Goal: Pain level will decrease with appropriate interventions Outcome: Progressing   Problem: Clinical Measurements: Goal: Ability to maintain clinical measurements within normal limits will improve Outcome: Progressing   Problem: Safety: Goal: Ability to remain free from injury will improve Outcome: Progressing   

## 2023-02-19 NOTE — Plan of Care (Signed)
  Problem: Activity: Goal: Ability to avoid complications of mobility impairment will improve Outcome: Progressing Goal: Range of joint motion will improve Outcome: Progressing   Problem: Pain Management: Goal: Pain level will decrease with appropriate interventions Outcome: Progressing   Problem: Safety: Goal: Ability to remain free from injury will improve Outcome: Progressing   

## 2023-02-19 NOTE — Evaluation (Signed)
Physical Therapy Evaluation Patient Details Name: [name] [name] MRN: [id number] DOB: 23-Mar-1961 Today's Date: 02/19/2023  History of Present Illness  62 yo female s/p L TKA on 02/19/23. PMH: HTN, CHF, obesity, DM,  Clinical Impression  Pt is s/p TKA resulting in the deficits listed below (see PT Problem List).  Pt able to amb  20' with RW and min-mod assist, heavy reliance on UEs d/t residual effects of adductor block causing decr quad activation LLE. Anticipate steady progress when this resolves.    Pt will benefit from acute skilled PT to increase their independence and safety with mobility to allow discharge.         Recommendations for follow up therapy are one component of a multi-disciplinary discharge planning process, led by the attending physician.  Recommendations may be updated based on patient status, additional functional criteria and insurance authorization.  Follow Up Recommendations       Assistance Recommended at Discharge Intermittent Supervision/Assistance  Patient can return home with the following  A little help with walking and/or transfers;A little help with bathing/dressing/bathroom;Assist for transportation;Help with stairs or ramp for entrance;Assistance with cooking/housework    Equipment Recommendations None recommended by PT  Recommendations for Other Services       Functional Status Assessment Patient has had a recent decline in their functional status and demonstrates the ability to make significant improvements in function in a reasonable and predictable amount of time.     Precautions / Restrictions Precautions Precautions: Fall;Knee Required Braces or Orthoses: Knee Immobilizer - Left Restrictions Weight Bearing Restrictions: No Other Position/Activity Restrictions: WBAT      Mobility  Bed Mobility Overal bed mobility: Needs Assistance Bed Mobility: Supine to Sit     Supine to sit: Min assist     General bed mobility comments: assist  to scoot laterally and progress LEs off bed, cues for sequence and self assist    Transfers Overall transfer level: Needs assistance Equipment used: Rolling walker (2 wheels) Transfers: Sit to/from Stand Sit to Stand: Min assist, Mod assist, +2 safety/equipment           General transfer comment: assist to rise and transition to RW, cues for hand placement, wt shift and safety    Ambulation/Gait Ambulation/Gait assistance: Min assist, +2 safety/equipment Gait Distance (Feet): 20 Feet Assistive device: Rolling walker (2 wheels) Gait Pattern/deviations: Step-to pattern       General Gait Details: cues for sequence, use of UEs to off load LLE d/t residual effects of anesthesia and decr quad activation.  Stairs            Wheelchair Mobility    Modified Rankin (Stroke Patients Only)       Balance Overall balance assessment: Needs assistance Sitting-balance support: Feet supported, No upper extremity supported Sitting balance-Leahy Scale: Good     Standing balance support: Reliant on assistive device for balance, During functional activity Standing balance-Leahy Scale: Poor Standing balance comment: reliant on UEs and external assist                             Pertinent Vitals/Pain Pain Assessment Pain Assessment: 0-10 Pain Score: 3  Pain Location: L knee Pain Descriptors / Indicators: Aching, Sore Pain Intervention(s): Limited activity within patient's tolerance, Monitored during session, Repositioned    Home Living Family/patient expects to be discharged to:: Private residence Living Arrangements: Spouse/significant other Available Help at Discharge: Family;Available 24 hours/day Type of Home: House Home Access:  Stairs to enter Entrance Stairs-Rails: None Entrance Stairs-Number of Steps: 3   Home Layout: One level Home Equipment: Agricultural consultant (2 wheels);Cane - single point      Prior Function Prior Level of Function :  Independent/Modified Independent;Driving                     Hand Dominance        Extremity/Trunk Assessment   Upper Extremity Assessment Upper Extremity Assessment: Overall WFL for tasks assessed    Lower Extremity Assessment Lower Extremity Assessment: LLE deficits/detail LLE Deficits / Details: ankle WFL, ~ 15 to 20 degree quad lag with SLR, sensation intact       Communication   Communication: No difficulties  Cognition Arousal/Alertness: Awake/alert Behavior During Therapy: WFL for tasks assessed/performed Overall Cognitive Status: Within Functional Limits for tasks assessed                                          General Comments      Exercises Total Joint Exercises Ankle Circles/Pumps: AROM, Both, 5 reps Quad Sets: 5 reps, Both, AROM   Assessment/Plan    PT Assessment Patient needs continued PT services  PT Problem List Decreased strength;Decreased range of motion;Decreased activity tolerance;Decreased mobility;Decreased knowledge of precautions;Pain;Decreased knowledge of use of DME;Decreased balance       PT Treatment Interventions DME instruction;Therapeutic exercise;Gait training;Stair training;Functional mobility training;Therapeutic activities;Patient/family education    PT Goals (Current goals can be found in the Care Plan section)  Acute Rehab PT Goals Patient Stated Goal: less pain PT Goal Formulation: With patient Time For Goal Achievement: 02/26/23 Potential to Achieve Goals: Good    Frequency 7X/week     Co-evaluation               AM-PAC PT "6 Clicks" Mobility  Outcome Measure Help needed turning from your back to your side while in a flat bed without using bedrails?: A Little Help needed moving from lying on your back to sitting on the side of a flat bed without using bedrails?: A Little Help needed moving to and from a bed to a chair (including a wheelchair)?: A Little Help needed standing up from a  chair using your arms (e.g., wheelchair or bedside chair)?: A Little Help needed to walk in hospital room?: A Lot Help needed climbing 3-5 steps with a railing? : Total 6 Click Score: 15    End of Session Equipment Utilized During Treatment: Gait belt;Left knee immobilizer Activity Tolerance: Patient tolerated treatment well Patient left: in chair;with call bell/phone within reach;with chair alarm set;with family/visitor present Nurse Communication: Mobility status PT Visit Diagnosis: Other abnormalities of gait and mobility (R26.89);Difficulty in walking, not elsewhere classified (R26.2)    Time: 9604-5409 PT Time Calculation (min) (ACUTE ONLY): 27 min   Charges:   PT Evaluation $PT Eval Low Complexity: 1 Low PT Treatments $Gait Training: 8-22 mins        Delice Bison, PT  Acute Rehab Dept [facility]) [phone]  02/19/2023   [facility] 02/19/2023, 5:00 PM

## 2023-02-19 NOTE — Anesthesia Procedure Notes (Signed)
Anesthesia Regional Block: Adductor canal block   Pre-Anesthetic Checklist: , timeout performed,  Correct Patient, Correct Site, Correct Laterality,  Correct Procedure, Correct Position, site marked,  Risks and benefits discussed,  Surgical consent,  Pre-op evaluation,  At surgeon's request and post-op pain management  Laterality: Left  Prep: chloraprep       Needles:  Injection technique: Single-shot  Needle Type: Echogenic Stimulator Needle     Needle Length: 5cm  Needle Gauge: 22     Additional Needles:   Procedures:, nerve stimulator,,, ultrasound used (permanent image in chart),,    Narrative:  Start time: 02/19/2023 7:40 AM End time: 02/19/2023 7:44 AM Injection made incrementally with aspirations every 5 mL.  Performed by: Personally  Anesthesiologist: Bethena Midget, MD  Additional Notes: Functioning IV was confirmed and monitors were applied.  A 50mm 22ga Arrow echogenic stimulator needle was used. Sterile prep and drape,hand hygiene and sterile gloves were used. Ultrasound guidance: relevant anatomy identified, needle position confirmed, local anesthetic spread visualized around nerve(s)., vascular puncture avoided.  Image printed for medical record. Negative aspiration and negative test dose prior to incremental administration of local anesthetic. The patient tolerated the procedure well.

## 2023-02-19 NOTE — Progress Notes (Deleted)
Orthopedic Tech Progress Note Patient Details:  [name] [name] [birth year] [id number]  Patient ID: [name] [name], female   DOB: June 24, 1961, 62 y.o.   MRN: [id number] CPM removed by PT. Darleen Crocker 02/19/2023, 5:37 PM

## 2023-02-19 NOTE — Anesthesia Postprocedure Evaluation (Signed)
Anesthesia Post Note  Patient: [name] [name]  Procedure(s) Performed: TOTAL KNEE ARTHROPLASTY (Left: Knee)     Patient location during evaluation: PACU Anesthesia Type: Regional, Spinal and MAC Level of consciousness: oriented and awake and alert Pain management: pain level controlled Vital Signs Assessment: post-procedure vital signs reviewed and stable Respiratory status: spontaneous breathing, respiratory function stable and patient connected to nasal cannula oxygen Cardiovascular status: blood pressure returned to baseline and stable Postop Assessment: no headache, no backache and no apparent nausea or vomiting Anesthetic complications: no   No notable events documented.  Last Vitals:  Vitals:   02/19/23 1130 02/19/23 1151  BP: (!) 106/56 (!) 106/54  Pulse: 63 67  Resp: 16 18  Temp:  36.4 C  SpO2: 98% 97%    Last Pain:  Vitals:   02/19/23 1151  TempSrc: Oral  PainSc: 0-No pain                 Shauniece Kwan

## 2023-02-19 NOTE — Progress Notes (Signed)
Orthopedic Tech Progress Note Patient Details:  [name] [name] 01-Jul-1961 [id number]  CPM Left Knee CPM Left Knee: Off Left Knee Flexion (Degrees): 40 Left Knee Extension (Degrees): 10  Post Interventions Patient Tolerated: Fair  [name] [name] 02/19/2023, 2:13 PM

## 2023-02-19 NOTE — Op Note (Signed)
OPERATIVE REPORT-TOTAL KNEE ARTHROPLASTY   Pre-operative diagnosis- Osteoarthritis  Left knee(s)  Post-operative diagnosis- Osteoarthritis Left knee(s)  Procedure-  Left  Total Knee Arthroplasty  Surgeon- Gus Rankin. Catharine Kettlewell, MD  Assistant- Arther Abbott, PA-C   Anesthesia-   Adductor canal block and spinal  EBL- 25 ml   Drains None  Tourniquet time-  Total Tourniquet Time Documented: Thigh (Left) - 33 minutes Total: Thigh (Left) - 33 minutes     Complications- None  Condition-PACU - hemodynamically stable.   Brief Clinical Note  [name] [name] is a 62 y.o. year old female with end stage OA of her left knee with progressively worsening pain and dysfunction. She has constant pain, with activity and at rest and significant functional deficits with difficulties even with ADLs. She has had extensive non-op management including analgesics, injections of cortisone and viscosupplements, and home exercise program, but remains in significant pain with significant dysfunction. Radiographs show bone on bone arthritis medial and patellofemoral. She presents now for left Total Knee Arthroplasty.     Procedure in detail---   The patient is brought into the operating room and positioned supine on the operating table. After successful administration of  Adductor canal block and spinal,   a tourniquet is placed high on the  Left thigh(s) and the lower extremity is prepped and draped in the usual sterile fashion. Time out is performed by the operating team and then the  Left lower extremity is wrapped in Esmarch, knee flexed and the tourniquet inflated to 300 mmHg.       A midline incision is made with a ten blade through the subcutaneous tissue to the level of the extensor mechanism. A fresh blade is used to make a medial parapatellar arthrotomy. Soft tissue over the proximal medial tibia is subperiosteally elevated to the joint line with a knife and into the semimembranosus bursa with a Cobb  elevator. Soft tissue over the proximal lateral tibia is elevated with attention being paid to avoiding the patellar tendon on the tibial tubercle. The patella is everted, knee flexed 90 degrees and the ACL and PCL are removed. Findings are bone on bone medial and patellofemoral with large global osteophytes.        The drill is used to create a starting hole in the distal femur and the canal is thoroughly irrigated with sterile saline to remove the fatty contents. The 5 degree Left  valgus alignment guide is placed into the femoral canal and the distal femoral cutting block is pinned to remove 9 mm off the distal femur. Resection is made with an oscillating saw.      The tibia is subluxed forward and the menisci are removed. The extramedullary alignment guide is placed referencing proximally at the medial aspect of the tibial tubercle and distally along the second metatarsal axis and tibial crest. The block is pinned to remove 2mm off the more deficient medial  side. Resection is made with an oscillating saw. Size 3is the most appropriate size for the tibia and the proximal tibia is prepared with the modular drill and keel punch for that size.      The femoral sizing guide is placed and size 4 is most appropriate. Rotation is marked off the epicondylar axis and confirmed by creating a rectangular flexion gap at 90 degrees. The size 4 cutting block is pinned in this rotation and the anterior, posterior and chamfer cuts are made with the oscillating saw. The intercondylar block is then placed and that cut  is made.      Trial size 3 tibial component, trial size 4 posterior stabilized femur and a 7  mm posterior stabilized rotating platform insert trial is placed. Full extension is achieved with excellent varus/valgus and anterior/posterior balance throughout full range of motion. The patella is everted and thickness measured to be 22  mm. Free hand resection is taken to 12 mm, a 35 template is placed, lug holes  are drilled, trial patella is placed, and it tracks normally. Osteophytes are removed off the posterior femur with the trial in place. All trials are removed and the cut bone surfaces prepared with pulsatile lavage. Cement is mixed and once ready for implantation, the size 3 tibial implant, size  4 posterior stabilized femoral component, and the size 35 patella are cemented in place and the patella is held with the clamp. The trial insert is placed and the knee held in full extension. The Exparel (20 ml mixed with 60 ml saline) is injected into the extensor mechanism, posterior capsule, medial and lateral gutters and subcutaneous tissues.  All extruded cement is removed and once the cement is hard the permanent 7 mm posterior stabilized rotating platform insert is placed into the tibial tray.      The wound is copiously irrigated with saline solution and the extensor mechanism closed with # 0 Stratofix suture. The tourniquet is released for a total tourniquet time of 33  minutes. Flexion against gravity is 140 degrees and the patella tracks normally. Subcutaneous tissue is closed with 2.0 vicryl and subcuticular with running 4.0 Monocryl. The incision is cleaned and dried and steri-strips and a bulky sterile dressing are applied. The limb is placed into a knee immobilizer and the patient is awakened and transported to recovery in stable condition.      Please note that a surgical assistant was a medical necessity for this procedure in order to perform it in a safe and expeditious manner. Surgical assistant was necessary to retract the ligaments and vital neurovascular structures to prevent injury to them and also necessary for proper positioning of the limb to allow for anatomic placement of the prosthesis.   Gus Rankin [name] Herendeen, MD    02/19/2023, 9:29 AM

## 2023-02-19 NOTE — Anesthesia Procedure Notes (Signed)
Spinal  Patient location during procedure: OR Start time: 02/19/2023 8:22 AM End time: 02/19/2023 8:24 AM Reason for block: surgical anesthesia Staffing Anesthesiologist: Bethena Midget, MD Performed by: Bethena Midget, MD Authorized by: Bethena Midget, MD   Preanesthetic Checklist Completed: patient identified, IV checked, site marked, risks and benefits discussed, surgical consent, monitors and equipment checked, pre-op evaluation and timeout performed Spinal Block Patient position: sitting Prep: DuraPrep Patient monitoring: heart rate, cardiac monitor, continuous pulse ox and blood pressure Approach: midline Location: L3-4 Injection technique: single-shot Needle Needle type: Sprotte  Needle gauge: 24 G Needle length: 9 cm Assessment Sensory level: T4 Events: CSF return

## 2023-02-19 NOTE — Anesthesia Procedure Notes (Signed)
Procedure Name: MAC Date/Time: 02/19/2023 8:21 AM  Performed by: Nelle Don, CRNAPre-anesthesia Checklist: Patient identified, Emergency Drugs available, Suction available and Patient being monitored Oxygen Delivery Method: Simple face mask

## 2023-02-19 NOTE — Interval H&P Note (Signed)
History and Physical Interval Note:  02/19/2023 6:29 AM  [name] [name]  has presented today for surgery, with the diagnosis of left knee osteoarthritis.  The various methods of treatment have been discussed with the patient and family. After consideration of risks, benefits and other options for treatment, the patient has consented to  Procedure(s): TOTAL KNEE ARTHROPLASTY (Left) as a surgical intervention.  The patient's history has been reviewed, patient examined, no change in status, stable for surgery.  I have reviewed the patient's chart and labs.  Questions were answered to the patient's satisfaction.     Homero Fellers Jamee Keach

## 2023-02-19 NOTE — Progress Notes (Signed)
Orthopedic Tech Progress Note Patient Details:  [name] [name] [birth year] [id number]  CPM Left Knee CPM Left Knee: On Left Knee Flexion (Degrees): 40 Left Knee Extension (Degrees): 10  Post Interventions Patient Tolerated: Well  Darleen Crocker 02/19/2023, 10:13 AM

## 2023-02-20 ENCOUNTER — Encounter (HOSPITAL_COMMUNITY): Payer: Self-pay | Admitting: Orthopedic Surgery

## 2023-02-20 DIAGNOSIS — Z7982 Long term (current) use of aspirin: Secondary | ICD-10-CM | POA: Diagnosis not present

## 2023-02-20 DIAGNOSIS — I110 Hypertensive heart disease with heart failure: Secondary | ICD-10-CM | POA: Diagnosis not present

## 2023-02-20 DIAGNOSIS — Z794 Long term (current) use of insulin: Secondary | ICD-10-CM | POA: Diagnosis not present

## 2023-02-20 DIAGNOSIS — I5032 Chronic diastolic (congestive) heart failure: Secondary | ICD-10-CM | POA: Diagnosis not present

## 2023-02-20 DIAGNOSIS — E119 Type 2 diabetes mellitus without complications: Secondary | ICD-10-CM | POA: Diagnosis not present

## 2023-02-20 DIAGNOSIS — Z8673 Personal history of transient ischemic attack (TIA), and cerebral infarction without residual deficits: Secondary | ICD-10-CM | POA: Diagnosis not present

## 2023-02-20 DIAGNOSIS — Z79899 Other long term (current) drug therapy: Secondary | ICD-10-CM | POA: Diagnosis not present

## 2023-02-20 DIAGNOSIS — M1712 Unilateral primary osteoarthritis, left knee: Secondary | ICD-10-CM | POA: Diagnosis not present

## 2023-02-20 LAB — BASIC METABOLIC PANEL
Anion gap: 9 (ref 5–15)
BUN: 19 mg/dL (ref 8–23)
CO2: 25 mmol/L (ref 22–32)
Calcium: 8.9 mg/dL (ref 8.9–10.3)
Chloride: 106 mmol/L (ref 98–111)
Creatinine, Ser: 0.95 mg/dL (ref 0.44–1.00)
GFR, Estimated: >60 mL/min (ref >60)
Glucose, Bld: 133 mg/dL — ABNORMAL HIGH (ref 70–99)
Potassium: 3.9 mmol/L (ref 3.5–5.1)
Sodium: 140 mmol/L (ref 135–145)

## 2023-02-20 LAB — CBC
HCT: 31.6 % — ABNORMAL LOW (ref 36.0–46.0)
Hemoglobin: 10.5 g/dL — ABNORMAL LOW (ref 12.0–15.0)
MCH: 31.3 pg (ref 26.0–34.0)
MCHC: 33.2 g/dL (ref 30.0–36.0)
MCV: 94.3 fL (ref 80.0–100.0)
Platelets: 148 10*3/uL — ABNORMAL LOW (ref 150–400)
RBC: 3.35 MIL/uL — ABNORMAL LOW (ref 3.87–5.11)
RDW: 12.7 % (ref 11.5–15.5)
WBC: 7.1 10*3/uL (ref 4.0–10.5)
nRBC: 0 % (ref 0.0–0.2)

## 2023-02-20 LAB — GLUCOSE, CAPILLARY
Glucose-Capillary: 137 mg/dL — ABNORMAL HIGH (ref 70–99)
Glucose-Capillary: 216 mg/dL — ABNORMAL HIGH (ref 70–99)

## 2023-02-20 MED ORDER — HYDROMORPHONE HCL 2 MG PO TABS: 2.0000 mg | ORAL_TABLET | Freq: Four times a day (QID) | ORAL | 0 refills | Status: DC | PRN

## 2023-02-20 MED ORDER — METHOCARBAMOL 500 MG PO TABS: 500.0000 mg | ORAL_TABLET | Freq: Four times a day (QID) | ORAL | 0 refills | Status: DC | PRN

## 2023-02-20 MED ORDER — RIVAROXABAN 10 MG PO TABS: 10.0000 mg | ORAL_TABLET | Freq: Every day | ORAL | 0 refills | Status: AC

## 2023-02-20 NOTE — Progress Notes (Signed)
Subjective: 1 Day Post-Op Procedure(s) (LRB): TOTAL KNEE ARTHROPLASTY (Left) Patient reports pain as mild.   Patient seen in rounds for Dr. Lequita Halt. Patient is well, and has had no acute complaints or problems No issues overnight. Denies chest pain, SOB, or calf pain. [place] catheter removed this AM.  We will continue therapy today, ambulated 20' yesterday.   Objective: Vital signs in last 24 hours: Temp:  [97.4 F (36.3 C)-98.2 F (36.8 C)] 97.4 F (36.3 C) (06/11 0558) Pulse Rate:  [63-77] 66 (06/11 0558) Resp:  [14-21] 17 (06/11 0558) BP: (100-129)/(47-76) 108/76 (06/11 0558) SpO2:  [91 %-100 %] 100 % (06/11 0558)  Intake/Output from previous day:  Intake/Output Summary (Last 24 hours) at 02/20/2023 0847 Last data filed at 02/20/2023 0600 Gross per 24 hour  Intake 3623.78 ml  Output 3725 ml  Net -101.22 ml     Intake/Output this shift: No intake/output data recorded.  Labs: Recent Labs    02/20/23 0351  HGB 10.5*   Recent Labs    02/20/23 0351  WBC 7.1  RBC 3.35*  HCT 31.6*  PLT 148*   Recent Labs    02/20/23 0351  NA 140  K 3.9  CL 106  CO2 25  BUN 19  CREATININE 0.95  GLUCOSE 133*  CALCIUM 8.9   No results for input(s): "LABPT", "INR" in the last 72 hours.  Exam: General - Patient is Alert and Oriented Extremity - Neurologically intact Neurovascular intact Sensation intact distally Dorsiflexion/Plantar flexion intact Dressing - dressing C/D/I Motor Function - intact, moving foot and toes well on exam.   Past Medical History:  Diagnosis Date   Abnormal EKG    Allergy    Anxiety    PTSD   Arthritis    CHF (congestive heart failure) (HCC)    Depression    Diabetes mellitus without complication (HCC)    GERD (gastroesophageal reflux disease)    History of kidney stones    Hx of absence seizures    last seizure- 1978   Hyperlipidemia    Hypertension    Unspecified   IBS (irritable bowel syndrome)    Panic attacks    severe panic  attacks followed by DR Anne Hahn when happens cannot speak   Panic disorder 03/20/2019   Pneumonia    Seizures (HCC)    Reports last seizure was 30 + years ago    Sleep apnea    no CPAP worn   Stroke (HCC)    2004  slight right eye droop    Assessment/Plan: 1 Day Post-Op Procedure(s) (LRB): TOTAL KNEE ARTHROPLASTY (Left) Principal Problem:   OA (osteoarthritis) of knee Active Problems:   Primary osteoarthritis of left knee  Estimated body mass index is 31.25 kg/m as calculated from the following:   Height as of this encounter: 5' (1.524 m).   Weight as of this encounter: 72.6 kg. Advance diet Up with therapy D/C IV fluids   Patient's anticipated LOS is less than 2 midnights, meeting these requirements: - Younger than 22 - Lives within 1 hour of care - Has a competent adult at home to recover with post-op recover - NO history of  - Chronic pain requiring opioids  - DVT/VTE  - Cardiac arrhythmia  - Respiratory Failure/COPD  - Renal failure  - Anemia  - Advanced Liver disease  DVT Prophylaxis - Xarelto Weight bearing as tolerated. Continue therapy.  Plan is to go Home after hospital stay. Plan for discharge later today if progresses with therapy  and meeting goals. Scheduled for OPPT at [facility]. Follow-up in the office in 2 weeks.  The PDMP database was reviewed today prior to any opioid medications being prescribed to this patient.  Arther Abbott, PA-C Orthopedic Surgery [phone] 02/20/2023, 8:47 AM

## 2023-02-20 NOTE — Plan of Care (Signed)
  Problem: Education: Goal: Ability to describe self-care measures that may prevent or decrease complications (Diabetes Survival Skills Education) will improve Outcome: Progressing Goal: Individualized Educational Video(s) Outcome: Progressing   Problem: Coping: Goal: Ability to adjust to condition or change in health will improve Outcome: Progressing   Problem: Nutritional: Goal: Maintenance of adequate nutrition will improve Outcome: Progressing Goal: Progress toward achieving an optimal weight will improve Outcome: Progressing   

## 2023-02-20 NOTE — TOC Transition Note (Signed)
Transition of Care [facility]) - CM/SW Discharge Note  Patient Details  Name: [name] [name] MRN: [id number] Date of Birth: 1961-04-12  [facility]) CM/SW Contact:  Ewing Schlein, LCSW Phone Number: 02/20/2023, 10:43 AM  Clinical Narrative: Patient is expected to discharge home after working with PT. CSW met with patient and spouse, Homero Fellers, to confirm discharge plan and needs. Patient will go home with OPPT at [facility]. Patient will need a youth rolling walker, which [employer] delivered to patient's room. TOC signing off.    Final next level of care: OP Rehab Barriers to Discharge: No Barriers Identified  Patient Goals and CMS Choice CMS Medicare.gov Compare Post Acute Care list provided to:: Patient Choice offered to / list presented to : Patient  Discharge Plan and Services Additional resources added to the After Visit Summary for          DME Arranged: Walker youth DME Agency: [employer] Date DME Agency Contacted: 02/20/23 Representative spoke with at DME Agency: Yvonna Alanis  Social Determinants of Health (SDOH) Interventions SDOH Screenings   Food Insecurity: No Food Insecurity (02/19/2023)  Housing: Low Risk  (02/19/2023)  Transportation Needs: No Transportation Needs (02/19/2023)  Utilities: Not At Risk (02/19/2023)  Depression (PHQ2-9): Medium Risk (11/07/2021)  Tobacco Use: Low Risk  (02/19/2023)   Readmission Risk Interventions     No data to display

## 2023-02-20 NOTE — Progress Notes (Signed)
Physical Therapy Treatment Patient Details Name: [name] [name] MRN: [id number] DOB: 1960-12-21 Today's Date: 02/20/2023   History of Present Illness 62 yo female s/p L TKA on 02/19/23. PMH: HTN, CHF, obesity, DM,    PT Comments    Pt is progressing very well this session. Will see again after lunch to review stair training and pt will likely be ready to d/c later today    Recommendations for follow up therapy are one component of a multi-disciplinary discharge planning process, led by the attending physician.  Recommendations may be updated based on patient status, additional functional criteria and insurance authorization.  Follow Up Recommendations       Assistance Recommended at Discharge Intermittent Supervision/Assistance  Patient can return home with the following A little help with walking and/or transfers;A little help with bathing/dressing/bathroom;Assist for transportation;Help with stairs or ramp for entrance;Assistance with cooking/housework   Equipment Recommendations  None recommended by PT    Recommendations for Other Services       Precautions / Restrictions Precautions Precautions: Fall;Knee Required Braces or Orthoses: Knee Immobilizer - Left Knee Immobilizer - Left: Discontinue once straight leg raise with < 10 degree lag Restrictions Weight Bearing Restrictions: No Other Position/Activity Restrictions: WBAT     Mobility  Bed Mobility Overal bed mobility: Needs Assistance Bed Mobility: Supine to Sit     Supine to sit: Min guard, Supervision     General bed mobility comments: for safety, no physical assist    Transfers Overall transfer level: Needs assistance Equipment used: Rolling walker (2 wheels) Transfers: Sit to/from Stand Sit to Stand: Min guard           General transfer comment: cues for hand placement and LLE position    Ambulation/Gait Ambulation/Gait assistance: Min guard Gait Distance (Feet): 90 Feet Assistive device:  Rolling walker (2 wheels) Gait Pattern/deviations: Step-to pattern, Decreased stance time - left       General Gait Details: cues for sequence, RW position, step length. pt able to tol incr wt on LLE with incr distance   Stairs             Wheelchair Mobility    Modified Rankin (Stroke Patients Only)       Balance                                            Cognition Arousal/Alertness: Awake/alert Behavior During Therapy: WFL for tasks assessed/performed Overall Cognitive Status: Within Functional Limits for tasks assessed                                          Exercises      General Comments        Pertinent Vitals/Pain Pain Assessment Pain Assessment: 0-10 Pain Score: 3  Pain Location: L knee Pain Descriptors / Indicators: Aching, Sore Pain Intervention(s): Limited activity within patient's tolerance, Monitored during session, Premedicated before session, Repositioned, Ice applied    Home Living                          Prior Function            PT Goals (current goals can now be found in the care plan section) Acute Rehab PT Goals Patient Stated  Goal: less pain PT Goal Formulation: With patient Time For Goal Achievement: 02/26/23 Potential to Achieve Goals: Good Progress towards PT goals: Progressing toward goals    Frequency    7X/week      PT Plan Current plan remains appropriate    Co-evaluation              AM-PAC PT "6 Clicks" Mobility   Outcome Measure  Help needed turning from your back to your side while in a flat bed without using bedrails?: A Little Help needed moving from lying on your back to sitting on the side of a flat bed without using bedrails?: A Little Help needed moving to and from a bed to a chair (including a wheelchair)?: A Little Help needed standing up from a chair using your arms (e.g., wheelchair or bedside chair)?: A Little Help needed to walk in  hospital room?: A Little Help needed climbing 3-5 steps with a railing? : A Little 6 Click Score: 18    End of Session Equipment Utilized During Treatment: Gait belt;Left knee immobilizer Activity Tolerance: Patient tolerated treatment well Patient left: in chair;with call bell/phone within reach;with chair alarm set;with family/visitor present Nurse Communication: Mobility status PT Visit Diagnosis: Other abnormalities of gait and mobility (R26.89);Difficulty in walking, not elsewhere classified (R26.2)     Time: 3244-0102 PT Time Calculation (min) (ACUTE ONLY): 18 min  Charges:  $Gait Training: 8-22 mins                     Delice Bison, PT  Acute Rehab Dept [facility]) [phone]  02/20/2023    [facility] 02/20/2023, 12:25 PM

## 2023-02-20 NOTE — Progress Notes (Signed)
PT TX NOTE  02/20/23 1300  PT Visit Information  Last PT Received On 02/20/23  Assistance Needed Pt meeting goals, reviewed TKA HEP, importance of terminal knee extension, use of KI with pt and husband.  Husband present throughout session and able to provide correct cues and min/guard for safety with mobility. Pt/spouse feel ready to d/t today.   History of Present Illness 62 yo female s/p L TKA on 02/19/23. PMH: HTN, CHF, obesity, DM,  Subjective Data  Patient Stated Goal less pain  Precautions  Precautions Fall;Knee  Required Braces or Orthoses Knee Immobilizer - Left  Knee Immobilizer - Left Discontinue once straight leg raise with < 10 degree lag  Restrictions  Weight Bearing Restrictions No  Other Position/Activity Restrictions WBAT  Pain Assessment  Pain Assessment 0-10  Pain Location L knee  Pain Descriptors / Indicators Aching;Sore  Pain Intervention(s) Limited activity within patient's tolerance;Monitored during session;Premedicated before session;Repositioned;Ice applied  Cognition  Arousal/Alertness Awake/alert  Behavior During Therapy WFL for tasks assessed/performed  Overall Cognitive Status Within Functional Limits for tasks assessed  Bed Mobility  Overal bed mobility Needs Assistance  Bed Mobility Sit to Supine  Sit to supine Supervision  General bed mobility comments for safety, no physical assist  Transfers  Overall transfer level Needs assistance  Equipment used Rolling walker (2 wheels)  Transfers Sit to/from Stand  Sit to Stand Min guard;Supervision  General transfer comment cues for hand placement and LLE position  Ambulation/Gait  Ambulation/Gait assistance Min guard  Gait Distance (Feet) 40 Feet  Assistive device Rolling walker (2 wheels)  Gait Pattern/deviations Step-to pattern;Decreased stance time - left  General Gait Details cues for sequence, RW position, step length. steady with RW, no LOB  Stairs Yes  Stairs assistance Min assist  Stair  Management No rails;Step to pattern;Forwards;With walker  Number of Stairs 2  General stair comments cues for sequence, RW position; assist to stabilize RW, husband present; no knee buckling wtih KI in place, no LOB  Balance  Standing balance support Reliant on assistive device for balance;During functional activity  Standing balance-Leahy Scale Fair  Standing balance comment pt is able to maintain static standing without UE support  Total Joint Exercises  Ankle Circles/Pumps AROM;Both;10 reps  Quad Sets AROM;Both;10 reps  Heel Slides AAROM;Left;10 reps  Hip ABduction/ADduction AROM;Both;10 reps  Straight Leg Raises AROM;Left;10 reps  PT - End of Session  Equipment Utilized During Treatment Gait belt;Left knee immobilizer  Activity Tolerance Patient tolerated treatment well  Patient left with call bell/phone within reach;with family/visitor present;in bed;with bed alarm set  Nurse Communication Mobility status   PT - Assessment/Plan  PT Plan Current plan remains appropriate  PT Visit Diagnosis Other abnormalities of gait and mobility (R26.89);Difficulty in walking, not elsewhere classified (R26.2)  PT Frequency (ACUTE ONLY) 7X/week  Follow Up Recommendations Follow physician's recommendations for discharge plan and follow up therapies  Assistance recommended at discharge Intermittent Supervision/Assistance  Patient can return home with the following A little help with walking and/or transfers;A little help with bathing/dressing/bathroom;Assist for transportation;Help with stairs or ramp for entrance;Assistance with cooking/housework  PT equipment None recommended by PT  AM-PAC PT "6 Clicks" Mobility Outcome Measure (Version 2)  Help needed turning from your back to your side while in a flat bed without using bedrails? 3  Help needed moving from lying on your back to sitting on the side of a flat bed without using bedrails? 3  Help needed moving to and from a bed to a chair (  including a  wheelchair)? 3  Help needed standing up from a chair using your arms (e.g., wheelchair or bedside chair)? 3  Help needed to walk in hospital room? 3  Help needed climbing 3-5 steps with a railing?  3  6 Click Score 18  Consider Recommendation of Discharge To: Home with [facility]  PT Goal Progression  Progress towards PT goals Progressing toward goals  Acute Rehab PT Goals  PT Goal Formulation With patient  Time For Goal Achievement 02/26/23  Potential to Achieve Goals Good  PT Time Calculation  PT Start Time (ACUTE ONLY) 1252  PT Stop Time (ACUTE ONLY) 1320  PT Time Calculation (min) (ACUTE ONLY) 28 min  PT General Charges  $$ ACUTE PT VISIT 1 Visit  PT Treatments  $Gait Training 8-22 mins  $Therapeutic Exercise 8-22 mins

## 2023-02-21 NOTE — Discharge Summary (Signed)
Patient ID: [name] [name] MRN: [id number] DOB/AGE: December 28, 1960 62 y.o.  Admit date: 02/19/2023 Discharge date: 02/20/2023  Admission Diagnoses:  Principal Problem:   OA (osteoarthritis) of knee Active Problems:   Primary osteoarthritis of left knee   Discharge Diagnoses:  Same  Past Medical History:  Diagnosis Date   Abnormal EKG    Allergy    Anxiety    PTSD   Arthritis    CHF (congestive heart failure) (HCC)    Depression    Diabetes mellitus without complication (HCC)    GERD (gastroesophageal reflux disease)    History of kidney stones    Hx of absence seizures    last seizure- 1978   Hyperlipidemia    Hypertension    Unspecified   IBS (irritable bowel syndrome)    Panic attacks    severe panic attacks followed by DR Anne Hahn when happens cannot speak   Panic disorder 03/20/2019   Pneumonia    Seizures (HCC)    Reports last seizure was 30 + years ago    Sleep apnea    no CPAP worn   Stroke (HCC)    2004  slight right eye droop    Surgeries: Procedure(s): TOTAL KNEE ARTHROPLASTY on 02/19/2023   Consultants:   Discharged Condition: Improved  Hospital Course: [name] is an 62 y.o. female who was admitted 02/19/2023 for operative treatment ofOA (osteoarthritis) of knee. Patient has severe unremitting pain that affects sleep, daily activities, and work/hobbies. After pre-op clearance the patient was taken to the operating room on 02/19/2023 and underwent  Procedure(s): TOTAL KNEE ARTHROPLASTY.    Patient was given perioperative antibiotics:  Anti-infectives (From admission, onward)    Start     Dose/Rate Route Frequency Ordered Stop   02/19/23 1400  ceFAZolin (ANCEF) IVPB 2g/100 mL premix        2 g 200 mL/hr over 30 Minutes Intravenous Every 6 hours 02/19/23 1148 02/20/23 0858   02/19/23 0630  ceFAZolin (ANCEF) IVPB 2g/100 mL premix        2 g 200 mL/hr over 30 Minutes Intravenous On call to O.R. 02/19/23 4098 02/19/23 0854        Patient was given  sequential compression devices, early ambulation, and chemoprophylaxis to prevent DVT.  Patient benefited maximally from hospital stay and there were no complications.    Recent vital signs: Patient Vitals for the past 24 hrs:  BP Temp Pulse Resp SpO2  02/20/23 1008 -- -- -- -- 98 %  02/20/23 0948 (!) 119/58 97.7 F (36.5 C) 67 16 (!) 88 %     Recent laboratory studies:  Recent Labs    02/20/23 0351  WBC 7.1  HGB 10.5*  HCT 31.6*  PLT 148*  NA 140  K 3.9  CL 106  CO2 25  BUN 19  CREATININE 0.95  GLUCOSE 133*  CALCIUM 8.9     Discharge Medications:   Allergies as of 02/20/2023       Reactions   Prednisone Other (See Comments)   Sweating   Codeine Rash   Doxycycline Rash   Erythromycin Rash        Medication List     STOP taking these medications    aspirin 81 MG tablet   Citrucel oral powder Generic drug: methylcellulose   lidocaine 5 % Commonly known as: LIDODERM   lidocaine 5 % ointment Commonly known as: XYLOCAINE   metoprolol tartrate 100 MG tablet Commonly known as: LOPRESSOR   promethazine 12.5 MG  tablet Commonly known as: PHENERGAN       TAKE these medications    ALPRAZolam 1 MG tablet Commonly known as: XANAX Take 1 mg by mouth daily. Additional if needed up to 3 time a day   atorvastatin 80 MG tablet Commonly known as: LIPITOR Take 1 tablet (80 mg total) by mouth daily. What changed: when to take this   buPROPion 150 MG 24 hr tablet Commonly known as: WELLBUTRIN XL Take 150 mg by mouth every morning.   citalopram 40 MG tablet Commonly known as: CELEXA Take 40 mg by mouth daily.   Fenofibric Acid 135 MG Cpdr Take 135 mg by mouth at bedtime.   furosemide 20 MG tablet Commonly known as: LASIX Twice daily as needed if weight >3 Lbs   gabapentin 400 MG capsule Commonly known as: NEURONTIN Take 400 mg by mouth 2 (two) times daily.   glimepiride 4 MG tablet Commonly known as: AMARYL Take 4 mg by mouth daily with  breakfast.   HYDROmorphone 2 MG tablet Commonly known as: DILAUDID Take 1-2 tablets (2-4 mg total) by mouth every 6 (six) hours as needed for severe pain.   insulin glargine 100 UNIT/ML injection Commonly known as: LANTUS Inject 73 Units into the skin daily.   Jardiance 10 MG Tabs tablet Generic drug: empagliflozin Take 10 mg by mouth daily.   methocarbamol 500 MG tablet Commonly known as: ROBAXIN Take 1 tablet (500 mg total) by mouth every 6 (six) hours as needed for muscle spasms.   metoprolol succinate 25 MG 24 hr tablet Commonly known as: TOPROL-XL Take 1 tablet (25 mg total) by mouth daily. Take with or immediately following a meal.   omeprazole 20 MG capsule Commonly known as: PRILOSEC Take 20 mg by mouth at bedtime.   potassium chloride SA 20 MEQ tablet Commonly known as: KLOR-CON M Take 20 mEq by mouth daily as needed (With Furosemide). Only if furosemide taken.   rivaroxaban 10 MG Tabs tablet Commonly known as: XARELTO Take 1 tablet (10 mg total) by mouth daily with breakfast for 20 days. Then resume one 81 mg aspirin once a day   Semaglutide(0.25 or 0.5MG /DOS) 2 MG/1.5ML Sopn Inject 1 mL into the skin every Sunday. OZEMPIC   spironolactone 50 MG tablet Commonly known as: ALDACTONE TAKE ONE TABLET BY MOUTH EVERY DAY IN THE MORNING   verapamil 120 MG CR tablet Commonly known as: CALAN-SR Take 120 mg by mouth at bedtime.               Discharge Care Instructions  (From admission, onward)           Start     Ordered   02/20/23 0000  Weight bearing as tolerated        06 /11/24 0848   02/20/23 0000  Change dressing       Comments: You may remove the bulky bandage (ACE wrap and gauze) two days after surgery. You will have an adhesive waterproof bandage underneath. Leave this in place until your first follow-up appointment.   02/20/23 0848            Diagnostic Studies: No results found.  Disposition: Discharge disposition: 01-Home or Self  Care       Discharge Instructions     Call MD / Call 911   Complete by: As directed    If you experience chest pain or shortness of breath, CALL 911 and be transported to the hospital emergency room.  If you develope a  fever above 101 F, pus (white drainage) or increased drainage or redness at the wound, or calf pain, call your surgeon's office.   Change dressing   Complete by: As directed    You may remove the bulky bandage (ACE wrap and gauze) two days after surgery. You will have an adhesive waterproof bandage underneath. Leave this in place until your first follow-up appointment.   Constipation Prevention   Complete by: As directed    Drink plenty of fluids.  Prune juice may be helpful.  You may use a stool softener, such as Colace (over the counter) 100 mg twice a day.  Use MiraLax (over the counter) for constipation as needed.   Diet - low sodium heart healthy   Complete by: As directed    Do not put a pillow under the knee. Place it under the heel.   Complete by: As directed    Driving restrictions   Complete by: As directed    No driving for two weeks   Post-operative opioid taper instructions:   Complete by: As directed    POST-OPERATIVE OPIOID TAPER INSTRUCTIONS: It is important to wean off of your opioid medication as soon as possible. If you do not need pain medication after your surgery it is ok to stop day one. Opioids include: Codeine, Hydrocodone(Norco, Vicodin), Oxycodone(Percocet, oxycontin) and hydromorphone amongst others.  Long term and even short term use of opiods can cause: Increased pain response Dependence Constipation Depression Respiratory depression And more.  Withdrawal symptoms can include Flu like symptoms Nausea, vomiting And more Techniques to manage these symptoms Hydrate well Eat regular healthy meals Stay active Use relaxation techniques(deep breathing, meditating, yoga) Do Not substitute Alcohol to help with tapering If you have  been on opioids for less than two weeks and do not have pain than it is ok to stop all together.  Plan to wean off of opioids This plan should start within one week post op of your joint replacement. Maintain the same interval or time between taking each dose and first decrease the dose.  Cut the total daily intake of opioids by one tablet each day Next start to increase the time between doses. The last dose that should be eliminated is the evening dose.      TED hose   Complete by: As directed    Use stockings (TED hose) for three weeks on both leg(s).  You may remove them at night for sleeping.   Weight bearing as tolerated   Complete by: As directed         Follow-up Information     Aluisio, Homero Fellers, MD. Schedule an appointment as soon as possible for a visit in 2 week(s).   Specialty: Orthopedic Surgery Contact information: [address] [place] [address] [place] [address] [phone]                  Signed: Arther Abbott 02/21/2023, 7:43 AM

## 2023-02-22 DIAGNOSIS — M25562 Pain in left knee: Secondary | ICD-10-CM | POA: Diagnosis not present

## 2023-02-22 DIAGNOSIS — M6281 Muscle weakness (generalized): Secondary | ICD-10-CM | POA: Diagnosis not present

## 2023-02-22 DIAGNOSIS — Z471 Aftercare following joint replacement surgery: Secondary | ICD-10-CM | POA: Diagnosis not present

## 2023-02-26 DIAGNOSIS — M25562 Pain in left knee: Secondary | ICD-10-CM | POA: Diagnosis not present

## 2023-02-26 DIAGNOSIS — Z471 Aftercare following joint replacement surgery: Secondary | ICD-10-CM | POA: Diagnosis not present

## 2023-02-26 DIAGNOSIS — M6281 Muscle weakness (generalized): Secondary | ICD-10-CM | POA: Diagnosis not present

## 2023-02-28 DIAGNOSIS — Z471 Aftercare following joint replacement surgery: Secondary | ICD-10-CM | POA: Diagnosis not present

## 2023-02-28 DIAGNOSIS — M6281 Muscle weakness (generalized): Secondary | ICD-10-CM | POA: Diagnosis not present

## 2023-02-28 DIAGNOSIS — M25562 Pain in left knee: Secondary | ICD-10-CM | POA: Diagnosis not present

## 2023-03-05 DIAGNOSIS — Z471 Aftercare following joint replacement surgery: Secondary | ICD-10-CM | POA: Diagnosis not present

## 2023-03-05 DIAGNOSIS — M25562 Pain in left knee: Secondary | ICD-10-CM | POA: Diagnosis not present

## 2023-03-05 DIAGNOSIS — M6281 Muscle weakness (generalized): Secondary | ICD-10-CM | POA: Diagnosis not present

## 2023-04-24 ENCOUNTER — Encounter: Payer: Self-pay | Admitting: *Deleted

## 2023-05-03 DIAGNOSIS — F3341 Major depressive disorder, recurrent, in partial remission: Secondary | ICD-10-CM | POA: Diagnosis not present

## 2023-05-03 DIAGNOSIS — Z5181 Encounter for therapeutic drug level monitoring: Secondary | ICD-10-CM | POA: Diagnosis not present

## 2023-05-03 DIAGNOSIS — F410 Panic disorder [episodic paroxysmal anxiety] without agoraphobia: Secondary | ICD-10-CM | POA: Diagnosis not present

## 2023-05-03 DIAGNOSIS — F331 Major depressive disorder, recurrent, moderate: Secondary | ICD-10-CM | POA: Diagnosis not present

## 2023-05-17 ENCOUNTER — Other Ambulatory Visit: Payer: Self-pay | Admitting: Cardiology

## 2023-05-17 DIAGNOSIS — I5032 Chronic diastolic (congestive) heart failure: Secondary | ICD-10-CM

## 2023-05-17 DIAGNOSIS — I10 Essential (primary) hypertension: Secondary | ICD-10-CM

## 2023-05-30 NOTE — Telephone Encounter (Signed)
This encounter was created in error - please disregard.

## 2023-06-12 ENCOUNTER — Encounter: Payer: Self-pay | Admitting: Adult Health

## 2023-06-12 ENCOUNTER — Ambulatory Visit: Payer: Medicare HMO | Attending: Adult Health | Admitting: Adult Health

## 2023-06-12 VITALS — BP 124/80 | HR 73 | Ht 60.0 in | Wt 163.4 lb

## 2023-06-12 DIAGNOSIS — I10 Essential (primary) hypertension: Secondary | ICD-10-CM | POA: Diagnosis not present

## 2023-06-12 DIAGNOSIS — I5032 Chronic diastolic (congestive) heart failure: Secondary | ICD-10-CM | POA: Diagnosis not present

## 2023-06-12 DIAGNOSIS — F410 Panic disorder [episodic paroxysmal anxiety] without agoraphobia: Secondary | ICD-10-CM | POA: Diagnosis not present

## 2023-06-12 NOTE — Progress Notes (Signed)
Cardiology Clinic Note   Patient Name: [name] [name] Date of Encounter: 06/12/2023  Primary Care Provider:  Ignatius Specking, MD Primary Cardiologist:  Olga Millers, MD  Patient Profile    62 year old female with history of chronic diastolic heart failure, hyperlipidemia, hypertension, noncardiac chest pain with CTA revealing nonobstructive disease on 01/09/2023.  Past Medical History    Past Medical History:  Diagnosis Date   Abnormal EKG    Allergy    Anxiety    PTSD   Arthritis    CHF (congestive heart failure) (HCC)    Depression    Diabetes mellitus without complication (HCC)    GERD (gastroesophageal reflux disease)    History of kidney stones    Hx of absence seizures    last seizure- 1978   Hyperlipidemia    Hypertension    Unspecified   IBS (irritable bowel syndrome)    Panic attacks    severe panic attacks followed by DR Anne Hahn when happens cannot speak   Panic disorder 03/20/2019   Pneumonia    Seizures (HCC)    Reports last seizure was 30 + years ago    Sleep apnea    no CPAP worn   Stroke (HCC)    2004  slight right eye droop   Past Surgical History:  Procedure Laterality Date   HYSTEROSCOPY  2001   submucous myomectomy   TOTAL ABDOMINAL HYSTERECTOMY  2002   TAH BSO  pathology showed leiomyoma, adenomyosis, simple and complex hyperplasia without atypia   TOTAL KNEE ARTHROPLASTY Left 02/19/2023   Procedure: TOTAL KNEE ARTHROPLASTY;  Surgeon: Ollen Gross, MD;  Location: [facility];  Service: Orthopedics;  Laterality: Left;   TUMOR REMOVAL     from left ear    Allergies  Allergies  Allergen Reactions   Prednisone Other (See Comments)    Sweating   Codeine Rash   Doxycycline Rash   Erythromycin Rash    History of Present Illness    [name] [name] comes today as an add-on for complaints of jaw pain, chest pain radiating to the back.  She is under a lot of stress which she talks about at length caring for an elderly mother.  She also has significant  anxiety herself and is being followed by psychiatry.  She believes a lot of this is stress related and it is helpful when she has help with her mother and she takes her antianxiety medications.  Her blood pressures been well-controlled she has been medically compliant.  She is also to have lab work drawn which was part of the reason for her visit today.  Home Medications    Current Outpatient Medications  Medication Sig Dispense Refill   ALPRAZolam (XANAX) 1 MG tablet Take 1 mg by mouth daily. Additional if needed up to 3 time a day     atorvastatin (LIPITOR) 80 MG tablet Take 1 tablet (80 mg total) by mouth daily. (Patient taking differently: Take 80 mg by mouth at bedtime.) 90 tablet 3   buPROPion (WELLBUTRIN XL) 150 MG 24 hr tablet Take 150 mg by mouth every morning.     Choline Fenofibrate (FENOFIBRIC ACID) 135 MG CPDR Take 135 mg by mouth at bedtime.     citalopram (CELEXA) 40 MG tablet Take 40 mg by mouth daily.     furosemide (LASIX) 20 MG tablet Twice daily as needed if weight >3 Lbs 180 tablet 0   gabapentin (NEURONTIN) 400 MG capsule Take 400 mg by mouth 2 (two) times daily.  glimepiride (AMARYL) 4 MG tablet Take 4 mg by mouth daily with breakfast.     insulin glargine (LANTUS) 100 UNIT/ML injection Inject 73 Units into the skin daily.     JARDIANCE 10 MG TABS tablet Take 10 mg by mouth daily.     metoprolol succinate (TOPROL-XL) 25 MG 24 hr tablet Take 1 tablet (25 mg total) by mouth daily. Take with or immediately following a meal. 90 tablet 3   omeprazole (PRILOSEC) 20 MG capsule Take 20 mg by mouth at bedtime.   9   potassium chloride SA (KLOR-CON) 20 MEQ tablet Take 20 mEq by mouth daily as needed (With Furosemide). Only if furosemide taken. 180 tablet 1   Semaglutide,0.25 or 0.5MG /DOS, 2 MG/1.5ML SOPN Inject 1 mL into the skin every Sunday. OZEMPIC     spironolactone (ALDACTONE) 50 MG tablet TAKE ONE TABLET BY MOUTH EVERY DAY IN THE MORNING 90 tablet 1   verapamil (CALAN-SR)  120 MG CR tablet Take 120 mg by mouth at bedtime.   11   No current facility-administered medications for this visit.     Family History    Family History  Problem Relation Age of Onset   Uterine cancer Mother    Irritable bowel syndrome Mother    Stroke Mother    Osteoporosis Mother    Cancer Father        Prostate   High blood pressure Father    Heart attack Brother 89   Cancer Brother        Prostate   Heart disease Maternal Grandmother    Coronary artery disease Paternal Grandfather    Colitis Neg Hx    Esophageal cancer Neg Hx    Stomach cancer Neg Hx    Rectal cancer Neg Hx    She indicated that her mother is alive. She indicated that her father is deceased. She indicated that three of her four brothers are alive. She indicated that the status of her maternal grandmother is unknown. She indicated that the status of her paternal grandfather is unknown. She indicated that the status of her neg hx is unknown.  Social History    Social History   Socioeconomic History   Marital status: Married    Spouse name: Not on file   Number of children: 0   Years of education: Not on file   Highest education level: Some college, no degree  Occupational History   Occupation: Full Time    Comment: Sales  Tobacco Use   Smoking status: Never   Smokeless tobacco: Never  Vaping Use   Vaping status: Never Used  Substance and Sexual Activity   Alcohol use: No    Alcohol/week: 0.0 standard drinks of alcohol   Drug use: No   Sexual activity: Not Currently    Comment: 1st intercourse 62 yo-Fewer than 5 partners  Other Topics Concern   Not on file  Social History Narrative   Right handed   Caffeine 1-2 cups daily    Lives at home with husband with 2 dogs    Regular exercise: No   Social Determinants of Health   Financial Resource Strain: Not on file  Food Insecurity: No Food Insecurity (02/19/2023)   Hunger Vital Sign    Worried About Running Out of Food in the Last Year:  Never true    Ran Out of Food in the Last Year: Never true  Transportation Needs: No Transportation Needs (02/19/2023)   PRAPARE - Transportation    Lack of  Transportation (Medical): No    Lack of Transportation (Non-Medical): No  Physical Activity: Not on file  Stress: Not on file  Social Connections: Not on file  Intimate Partner Violence: Not At Risk (02/19/2023)   Humiliation, Afraid, Rape, and Kick questionnaire    Fear of Current or Ex-Partner: No    Emotionally Abused: No    Physically Abused: No    Sexually Abused: No     Review of Systems    General:  No chills, fever, night sweats or weight changes.  Significant stressor caring for elderly mother. Cardiovascular:  No chest pain, dyspnea on exertion, edema, orthopnea, palpitations, paroxysmal nocturnal dyspnea. Dermatological: No rash, lesions/masses Respiratory: No cough, dyspnea Urologic: No hematuria, dysuria Abdominal:   No nausea, vomiting, diarrhea, bright red blood per rectum, melena, or hematemesis Neurologic:  No visual changes, wkns, changes in mental status. All other systems reviewed and are otherwise negative except as noted above.  EKG Interpretation Date/Time:  Tuesday June 12 2023 15:03:22 EDT Ventricular Rate:  74 PR Interval:  174 QRS Duration:  86 QT Interval:  424 QTC Calculation: 470 R Axis:   27  Text Interpretation: Normal sinus rhythm Cannot rule out Anterior infarct (cited on or before 05-Sep-2022) T wave abnormality, consider lateral ischemia When compared with ECG of 05-Sep-2022 23:59, Inverted T waves have replaced nonspecific T wave abnormality in Lateral leads Confirmed by Joni Reining [phone]) on 06/12/2023 3:18:21 PM    Physical Exam    VS:  BP 124/80   Pulse 73   Ht 5' (1.524 m)   Wt 163 lb 6.4 oz (74.1 kg)   SpO2 97%   BMI 31.91 kg/m  , BMI Body mass index is 31.91 kg/m.  GEN: Well nourished, well developed, in no acute distress. HEENT: normal. Neck: Supple, no JVD,  carotid bruits, or masses. Cardiac: RRR, no murmurs, rubs, or gallops. No clubbing, cyanosis, edema.  Radials/DP/PT 2+ and equal bilaterally.  Respiratory:  Respirations regular and unlabored, clear to auscultation bilaterally. GI: Soft, nontender, nondistended, BS + x 4. MS: no deformity or atrophy. Skin: warm and dry, no rash. Neuro:  Strength and sensation are intact. Psych: Normal affect.  Some anxiousness and discussing stressors concerning family issues.  EKG Interpretation Date/Time:  Tuesday June 12 2023 15:03:22 EDT Ventricular Rate:  74 PR Interval:  174 QRS Duration:  86 QT Interval:  424 QTC Calculation: 470 R Axis:   27  Text Interpretation: Normal sinus rhythm Cannot rule out Anterior infarct (cited on or before 05-Sep-2022) T wave abnormality, consider lateral ischemia When compared with ECG of 05-Sep-2022 23:59, Inverted T waves have replaced nonspecific T wave abnormality in Lateral leads Confirmed by Joni Reining [phone]) on 06/12/2023 3:18:21 PM   Lab Results  Component Value Date   WBC 7.1 02/20/2023   HGB 10.5 (L) 02/20/2023   HCT 31.6 (L) 02/20/2023   MCV 94.3 02/20/2023   PLT 148 (L) 02/20/2023   Lab Results  Component Value Date   CREATININE 0.95 02/20/2023   BUN 19 02/20/2023   NA 140 02/20/2023   K 3.9 02/20/2023   CL 106 02/20/2023   CO2 25 02/20/2023   Lab Results  Component Value Date   ALT 30 04/03/2021   AST 25 04/03/2021   ALKPHOS 62 04/03/2021   BILITOT 0.8 04/03/2021   No results found for: "CHOL", "HDL", "LDLCALC", "LDLDIRECT", "TRIG", "CHOLHDL"  Lab Results  Component Value Date   HGBA1C 7.0 (H) 02/06/2023     Review of  Prior Studies Cardiac CTA 01/09/2023  1. Left Main: No significant stenosis   2. LAD: CTFFR gradually tapers, falling to 0.70 across distal stenosis, with change in FFR across lesion suggesting no significant stenosis   3. LCX: CTFFR 0.83 across lesion in proximal LCX, suggesting lesion is not  functionally significant   4. RCA: CTFFR 0.93 across lesion in proximal RCA, suggesting lesion is not functionally significant   IMPRESSION: 1.  CTFFR suggests nonobstructive CAD    Assessment & Plan   1.  Noncardiac chest pain: The patient describes jaw pain, chest discomfort radiating into her back when she is stressed.  Of note that she did have a cardiac CTA in April 2024 that showed nonobstructive coronary artery disease.  Pain is relieved with rest and antianxiety medications.  I do not feel the need to do a type of stress test or proceed with ischemic workup at this time.  She will follow-up with Dr. Jens Som in April 2024.  2. Hypertension: Excellent control of blood pressure today despite her anxiety.  Continue current medications which include verapamil and spironolactone.  3.  Chronic diastolic heart failure: Continues on furosemide 20 mg daily, spironolactone 50 mg daily, potassium supplementation.  Labs are drawn today for kidney function and potassium status.  4.  Situational stress and anxiety: Caring for an invalid mother requiring a lot of help causing a good bit of stress in her life.  She is seeing a psychiatrist and a Veterinary surgeon.  I have encouraged her to continue to support.        Signed, Bettey Mare. Liborio Nixon, ANP, AACC   06/12/2023 3:24 PM      Office [phone] Fax [phone]  Notice: This dictation was prepared with Dragon dictation along with smaller phrase technology. Any transcriptional errors that result from this process are unintentional and may not be corrected upon review.

## 2023-06-12 NOTE — Patient Instructions (Signed)
Medication Instructions:  No Changes *If you need a refill on your cardiac medications before your next appointment, please call your pharmacy*   Lab Work: No Labs If you have labs (blood work) drawn today and your tests are completely normal, you will receive your results only by: MyChart Message (if you have MyChart) OR A paper copy in the mail If you have any lab test that is abnormal or we need to change your treatment, we will call you to review the results.   Testing/Procedures: No Testing   Follow-Up: At [facility], you and your health needs are our priority.  As part of our continuing mission to provide you with exceptional heart care, we have created designated Provider Care Teams.  These Care Teams include your primary Cardiologist (physician) and Advanced Practice Providers (APPs -  Physician Assistants and Nurse Practitioners) who all work together to provide you with the care you need, when you need it.  We recommend signing up for the patient portal called "MyChart".  Sign up information is provided on this After Visit Summary.  MyChart is used to connect with patients for Virtual Visits (Telemedicine).  Patients are able to view lab/test results, encounter notes, upcoming appointments, etc.  Non-urgent messages can be sent to your provider as well.   To learn more about what you can do with MyChart, go to [email or web address].    Your next appointment:   April 2025  Provider:   Olga Millers, MD

## 2023-06-13 LAB — HEPATIC FUNCTION PANEL
ALT: 29 [IU]/L (ref 0–32)
AST: 42 [IU]/L — ABNORMAL HIGH (ref 0–40)
Albumin: 4.5 g/dL (ref 3.9–4.9)
Alkaline Phosphatase: 82 [IU]/L (ref 44–121)
Bilirubin Total: 0.3 mg/dL (ref 0.0–1.2)
Bilirubin, Direct: 0.13 mg/dL (ref 0.00–0.40)
Total Protein: 6.4 g/dL (ref 6.0–8.5)

## 2023-06-13 LAB — LIPID PANEL
Chol/HDL Ratio: 6.1 {ratio} — ABNORMAL HIGH (ref 0.0–4.4)
Cholesterol, Total: 147 mg/dL (ref 100–199)
HDL: 24 mg/dL — ABNORMAL LOW (ref >39)
LDL Chol Calc (NIH): 87 mg/dL (ref 0–99)
Triglycerides: 207 mg/dL — ABNORMAL HIGH (ref 0–149)
VLDL Cholesterol Cal: 36 mg/dL (ref 5–40)

## 2023-06-15 ENCOUNTER — Telehealth: Payer: Self-pay | Admitting: *Deleted

## 2023-06-15 DIAGNOSIS — E7800 Pure hypercholesterolemia, unspecified: Secondary | ICD-10-CM

## 2023-06-15 MED ORDER — EZETIMIBE 10 MG PO TABS: 10.0000 mg | ORAL_TABLET | Freq: Every day | ORAL | 3 refills | Status: DC

## 2023-06-15 NOTE — Telephone Encounter (Signed)
pt aware of results  New script sent to the pharmacy  Lab orders mailed to the pt  

## 2023-06-15 NOTE — Telephone Encounter (Signed)
-----   Message from Olga Millers sent at 06/13/2023  7:27 AM EDT ----- Add zetia 10 mg daily; lipids and liver 8 weeks; LDL not at goal. Olga Millers

## 2023-06-25 DIAGNOSIS — K219 Gastro-esophageal reflux disease without esophagitis: Secondary | ICD-10-CM | POA: Diagnosis not present

## 2023-06-25 DIAGNOSIS — N182 Chronic kidney disease, stage 2 (mild): Secondary | ICD-10-CM | POA: Diagnosis not present

## 2023-06-25 DIAGNOSIS — E669 Obesity, unspecified: Secondary | ICD-10-CM | POA: Diagnosis not present

## 2023-06-25 DIAGNOSIS — E1142 Type 2 diabetes mellitus with diabetic polyneuropathy: Secondary | ICD-10-CM | POA: Diagnosis not present

## 2023-06-25 DIAGNOSIS — G4733 Obstructive sleep apnea (adult) (pediatric): Secondary | ICD-10-CM | POA: Diagnosis not present

## 2023-06-25 DIAGNOSIS — Z794 Long term (current) use of insulin: Secondary | ICD-10-CM | POA: Diagnosis not present

## 2023-06-25 DIAGNOSIS — E1122 Type 2 diabetes mellitus with diabetic chronic kidney disease: Secondary | ICD-10-CM | POA: Diagnosis not present

## 2023-06-25 DIAGNOSIS — E785 Hyperlipidemia, unspecified: Secondary | ICD-10-CM | POA: Diagnosis not present

## 2023-06-25 DIAGNOSIS — F324 Major depressive disorder, single episode, in partial remission: Secondary | ICD-10-CM | POA: Diagnosis not present

## 2023-06-25 DIAGNOSIS — F4310 Post-traumatic stress disorder, unspecified: Secondary | ICD-10-CM | POA: Diagnosis not present

## 2023-06-25 DIAGNOSIS — I2510 Atherosclerotic heart disease of native coronary artery without angina pectoris: Secondary | ICD-10-CM | POA: Diagnosis not present

## 2023-06-25 DIAGNOSIS — I509 Heart failure, unspecified: Secondary | ICD-10-CM | POA: Diagnosis not present

## 2023-07-16 ENCOUNTER — Telehealth: Payer: Self-pay | Admitting: Cardiology

## 2023-07-16 NOTE — Telephone Encounter (Signed)
Pt c/o of Chest Pain: STAT if CP now or developed within 24 hours  1. Are you having CP right now?  No   2. Are you experiencing any other symptoms (ex. SOB, nausea, vomiting, sweating)?  Patient describes it as right jaw pain that radiates down into her chest, underneath her right breast. No additional symptoms.  3. How long have you been experiencing CP?  About 1 month. Has been ongoing since 10/01 appointment.  4. Is your CP continuous or coming and going?  Coming and going. Lasts about 10-15 minutes at a time.  5. Have you taken Nitroglycerin?  No  ?

## 2023-07-16 NOTE — Telephone Encounter (Signed)
Spoke to patient who stated that last night she had stabbing chest pain that ran down her jaw into her chest under her left breast. She also report having trouble breathing. She did not call 911 or go to the ER. The pain lasted for  about 10-15 minutes before going away. She deny any pain or symptoms at this time. She stated she had the same symptoms about 1 month ago and was seen in the office.

## 2023-07-17 NOTE — Telephone Encounter (Signed)
Left message for pt to call to schedule appointment with APP.
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
## 2023-07-19 ENCOUNTER — Encounter: Payer: Self-pay | Admitting: Cardiology

## 2023-07-19 ENCOUNTER — Ambulatory Visit: Payer: 59 | Attending: Cardiology | Admitting: Cardiology

## 2023-07-19 VITALS — BP 138/76 | HR 78 | Ht 60.0 in | Wt 164.0 lb

## 2023-07-19 DIAGNOSIS — I5032 Chronic diastolic (congestive) heart failure: Secondary | ICD-10-CM | POA: Diagnosis not present

## 2023-07-19 DIAGNOSIS — G4733 Obstructive sleep apnea (adult) (pediatric): Secondary | ICD-10-CM

## 2023-07-19 DIAGNOSIS — R079 Chest pain, unspecified: Secondary | ICD-10-CM

## 2023-07-19 DIAGNOSIS — I10 Essential (primary) hypertension: Secondary | ICD-10-CM | POA: Diagnosis not present

## 2023-07-19 DIAGNOSIS — F410 Panic disorder [episodic paroxysmal anxiety] without agoraphobia: Secondary | ICD-10-CM

## 2023-07-19 DIAGNOSIS — E7800 Pure hypercholesterolemia, unspecified: Secondary | ICD-10-CM

## 2023-07-19 NOTE — Telephone Encounter (Signed)
Follow up scheduled

## 2023-07-19 NOTE — Patient Instructions (Signed)
Medication Instructions:  No changes *If you need a refill on your cardiac medications before your next appointment, please call your pharmacy*  Lab Work: No labs  Testing/Procedures: No testing   Follow-Up: At [facility], you and your health needs are our priority.  As part of our continuing mission to provide you with exceptional heart care, we have created designated Provider Care Teams.  These Care Teams include your primary Cardiologist (physician) and Advanced Practice Providers (APPs -  Physician Assistants and Nurse Practitioners) who all work together to provide you with the care you need, when you need it.  We recommend signing up for the patient portal called "MyChart".  Sign up information is provided on this After Visit Summary.  MyChart is used to connect with patients for Virtual Visits (Telemedicine).  Patients are able to view lab/test results, encounter notes, upcoming appointments, etc.  Non-urgent messages can be sent to your provider as well.   To learn more about what you can do with MyChart, go to [email or web address].    Your next appointment:   2-3 month(s)  Provider:   Olga Millers, MD  or Robet Leu, PA-C or Joni Reining, DNP, ANP

## 2023-07-25 LAB — HEPATIC FUNCTION PANEL
ALT: 19 [IU]/L (ref 0–32)
AST: 23 [IU]/L (ref 0–40)
Albumin: 4.4 g/dL (ref 3.9–4.9)
Alkaline Phosphatase: 83 [IU]/L (ref 44–121)
Bilirubin Total: 0.4 mg/dL (ref 0.0–1.2)
Bilirubin, Direct: 0.14 mg/dL (ref 0.00–0.40)
Total Protein: 6.4 g/dL (ref 6.0–8.5)

## 2023-07-25 LAB — LIPID PANEL
Chol/HDL Ratio: 6.9 {ratio} — ABNORMAL HIGH (ref 0.0–4.4)
Cholesterol, Total: 144 mg/dL (ref 100–199)
HDL: 21 mg/dL — ABNORMAL LOW (ref >39)
LDL Chol Calc (NIH): 73 mg/dL (ref 0–99)
Triglycerides: 307 mg/dL — ABNORMAL HIGH (ref 0–149)
VLDL Cholesterol Cal: 50 mg/dL — ABNORMAL HIGH (ref 5–40)

## 2023-08-08 ENCOUNTER — Other Ambulatory Visit: Payer: Self-pay | Admitting: Cardiology

## 2023-08-08 DIAGNOSIS — I10 Essential (primary) hypertension: Secondary | ICD-10-CM

## 2023-08-08 DIAGNOSIS — I5032 Chronic diastolic (congestive) heart failure: Secondary | ICD-10-CM

## 2023-08-15 ENCOUNTER — Other Ambulatory Visit: Payer: Self-pay | Admitting: Internal Medicine

## 2023-08-15 DIAGNOSIS — Z1231 Encounter for screening mammogram for malignant neoplasm of breast: Secondary | ICD-10-CM

## 2023-08-21 ENCOUNTER — Inpatient Hospital Stay: Admission: RE | Admit: 2023-08-21 | Payer: 59 | Source: Ambulatory Visit

## 2023-08-27 ENCOUNTER — Ambulatory Visit
Admission: RE | Admit: 2023-08-27 | Discharge: 2023-08-27 | Disposition: A | Discharge status: Home / Self Care | Payer: 59 | Source: Ambulatory Visit | Attending: Internal Medicine | Admitting: Internal Medicine

## 2023-08-27 DIAGNOSIS — Z1231 Encounter for screening mammogram for malignant neoplasm of breast: Secondary | ICD-10-CM

## 2023-09-20 DIAGNOSIS — Z96652 Presence of left artificial knee joint: Secondary | ICD-10-CM | POA: Diagnosis not present

## 2023-09-20 DIAGNOSIS — M1711 Unilateral primary osteoarthritis, right knee: Secondary | ICD-10-CM | POA: Diagnosis not present

## 2023-09-27 DIAGNOSIS — R519 Headache, unspecified: Secondary | ICD-10-CM | POA: Diagnosis not present

## 2023-09-27 DIAGNOSIS — Z20822 Contact with and (suspected) exposure to covid-19: Secondary | ICD-10-CM | POA: Diagnosis not present

## 2023-10-04 NOTE — Progress Notes (Signed)
Cardiology Office Note:  .   Date:  10/11/2023  ID:  [name] [name], DOB 1961/04/02, MRN [id number] PCP: Ignatius Specking, MD  [facility] Providers Cardiologist:  Olga Millers, MD History of Present Illness: .   [name] [name] is a 63 y.o. female with past medical history of chronic diastolic heart failure, hyperlipidemia, hypertension, history of noncardiac chest pain with CTA revealing nonobstructive disease in 12/2022, anxiety/PTSD, depression, GERD, sleep apnea.  Patient is followed by Dr. Jens Som, presents today for a follow up appointment.    Prior to be followed by Dr. Jens Som, patient was followed by Dr. Jacinto Halim.  Previously underwent cardiac catheterization in 2005 that showed normal coronary arteries.  Later underwent nuclear stress test on 02/23/2017 that was normal, low risk study with EF estimated 85%.  Echocardiogram in 09/2018 showed EF 60-65%, mild LVH, no regional wall motion abnormalities, grade 1 DD.  She underwent carotid ultrasounds on 01/16/2019 that showed 1-39% stenosis in bilateral ICAs.   Patient again underwent nuclear stress test on 01/25/2022 that was a normal, low risk study.  Patient did experience chest pain after injection of Lexiscan, however there was no ischemia to correlate with reported chest pain.  Patient underwent echocardiogram on 01/26/2022 that showed normal LV function with EF 56%, grade 1 DD, no significant valvular abnormalities.   Patient was seen by Dr. Jens Som on 01/02/2023.  At that time, patient reported having an episode of chest pain that felt like pressure.  She took a Xanax for pain as she was concerned it could be anxiety.  To rule out ischemia, she underwent coronary CTA on 01/09/2023 that showed a coronary calcium score of 15 (67th percentile), none calcified plaque in proximal left circumflex with moderate stenosis, noncalcified plaques in proximal RCA with mild stenosis, calcified plaque in proximal and mid LAD causing minimal stenosis.  Study  was sent for [facility] which showed nonobstructive CAD.   I saw patient in clinic on 07/19/23. Then, she complaining of intermittent episodes of sharp pain that started in the right jaw and radiated down to the chest. She did notice a correlation between emotional stress and the onset of these symptoms. Discussed that her symptoms were atypical for a cardiac source, and her workup had been reassuring in the past.   Today, patient presents for a follow up appointment. She reports she has been doing very well from a cardiac perspective. Denies chest pain, shortness of breath. She does have a bit of anxiety related to her husband's upcoming colon cancer surgery. She is working to lose weight, but she has noticed weight gain and increased hunger despite being on Ozempic for weight management. She is scheduled for knee surgery in June  ROS: Denies chest pain, shortness of breath, ankle swelling, palpitations, syncope, near syncope, dizziness   Studies Reviewed: .   Cardiac Studies & Procedures     STRESS TESTS  NM MYOCAR MULTI W/SPECT W 02/23/2017  Narrative  Blood pressure demonstrated a hypertensive response to exercise.  There was no ST segment deviation noted during stress.  The study is normal.  This is a low risk study.  Nuclear stress EF: 85%.  ECHOCARDIOGRAM  PCV ECHOCARDIOGRAM COMPLETE 01/26/2022  Narrative Echocardiogram 01/26/2022: Left ventricle cavity is normal in size. Concentric hypertrophy of the left ventricle. Normal global wall motion. Normal LV systolic function with EF 56%. Doppler evidence of grade I (impaired) diastolic dysfunction, normal LAP. No significant valvular abnormality. IVC not seen.    CT SCANS  CT CORONARY MORPH W/CTA COR W/SCORE 01/09/2023  Addendum 01/13/2023 12:28 PM ADDENDUM REPORT: 01/13/2023 12:26  EXAM: OVER-READ INTERPRETATION  CT CHEST  The following report is an over-read performed by radiologist Dr. Karle Barr [facility] on  01/13/2023. This over-read does not include interpretation of cardiac or coronary anatomy or pathology. The coronary CTA interpretation by the cardiologist is attached.  COMPARISON:  None.  FINDINGS: Aorta normal caliber. Minimal pericardial effusion. Esophagus unremarkable. No adenopathy. Visualized upper abdomen normal. Small focus of subsegmental atelectasis at anterior lingula. Remaining lungs clear. No infiltrate, pleural effusion, or pneumothorax. Osseous structures unremarkable.  IMPRESSION: Minimal pericardial effusion.  Small focus of subsegmental atelectasis at anterior lingula.   Electronically Signed By: Ulyses Southward M.D. On: 01/13/2023 12:26  Narrative CLINICAL DATA:  43F with chest pain  EXAM: Cardiac/Coronary CTA  TECHNIQUE: The patient was scanned on a [employer].  FINDINGS: A 100 kV prospective scan was triggered in the descending thoracic aorta at 111 HU's. Axial non-contrast 3 mm slices were carried out through the heart. The data set was analyzed on a dedicated work station and scored using the Agatson method. Gantry rotation speed was 250 msecs and collimation was .6 mm. No beta blockade and 0.8 mg of sl NTG was given. The 3D data set was reconstructed in 5% intervals of the 35-75% of the R-R cycle. Phases were analyzed on a dedicated work station using MPR, MIP and VRT modes. The patient received 100 cc of contrast.  Coronary Arteries:  Normal coronary origin.  Right dominance.  RCA is a large dominant artery that gives rise to PDA and PLA. Noncalcified plaque in proximal RCA causes 24-49% stenosis  Left main is a large artery that gives rise to LAD and LCX arteries.  LAD is a large vessel. Calcified plaque in proximal LAD causes 0-24% stenosis. Calcified plaque in mid LAD causes 0-24% stenosis.  LCX is a non-dominant artery that gives rise to one large OM1 branch. Noncalcified plaque in proximal LCX causes 50-69%  stenosis.  Other findings:  Left Ventricle: Normal size  Left Atrium: Mild enlargement  Pulmonary Veins: Normal configuration  Right Ventricle: Normal size  Right Atrium: Normal size  Cardiac valves: No calcifications  Thoracic aorta: Normal size  Pulmonary Arteries: Normal size  Systemic Veins: Normal drainage  Pericardium: Normal thickness  IMPRESSION: 1. Coronary calcium score of 15. This was 67th percentile for age and sex matched control.  2. Total plaque volume 29mm3 which is 36th percentile for age and sex-matched controls (calcified plaque 11mm3; noncalcified plaque 42mm3). TPV is mild  3. Normal coronary origin with right dominance.  4. Noncalcified plaque in proximal LCX causes moderate (50-69%) stenosis.  5. Noncalcified plaque in proximal RCA causes mild (25-49%) stenosis  6. Calcified plaque in proximal and mid LAD causes minimal (0-24%) stenosis  7. Will send study for CTFFR  CAD-RADS 3. Moderate stenosis. Consider symptom-guided anti-ischemic pharmacotherapy as well as risk factor modification per guideline directed care. Additional analysis with CT FFR will be submitted.  Electronically Signed: By: Epifanio Lesches M.D. On: 01/09/2023 21:38          Risk Assessment/Calculations:         STOP-Bang Score:         Physical Exam:   VS:  BP 130/78 (BP Location: Left Arm, Patient Position: Sitting, Cuff Size: Normal)   Ht 5' (1.524 m)   Wt 166 lb 9.6 oz (75.6 kg)   SpO2 95%   BMI  32.54 kg/m    Wt Readings from Last 3 Encounters:  10/11/23 166 lb 9.6 oz (75.6 kg)  07/19/23 164 lb (74.4 kg)  06/12/23 163 lb 6.4 oz (74.1 kg)    GEN: Well nourished, well developed in no acute distress. Sitting comfortably on the exam table  NECK: No JVD  CARDIAC:  RRR, no murmurs, rubs, gallops. Radial pulses 2+ bilaterally  RESPIRATORY:  Clear to auscultation without rales, wheezing or rhonchi. Normal WOB on room air   ABDOMEN: Soft, non-tender,  non-distended EXTREMITIES:  No edema in BLE; No deformity   ASSESSMENT AND PLAN: .    HTN  - BP well controlled on current medications. Denies symptoms of orthostatic hypotension  - Continue metoprolol succinate 25 mg daily, spironolactone 50 mg daily, verapamil 120 mg daily    Chronic Diastolic Heart Failure  - Most recent echocardiogram from 01/2022 showed EF 56%, grade I DD, no significant valvular abnormalities - Euvolemic on exam today. Denies shortness of breath, ankle edema  - Continue lasix 20 mg as needed- reports only needing to take lasix on rare occasions  - Continue jardiance 10 mg daily, spironolactone 50 mg daily  - Kidney function and K have been stable on current medications    Anxiety  Panic Attacks  - Followed by PCP, reports doing well on her current medications    OSA  - Patient does not use CPAP due to discomfort    HLD  - Lipid panel from 07/2023 showed LDL 73, HDL 21, triglycerides 307, total cholesterol 144  - continue lipitor 80 mg daily, zetia 10 mg daily   Noncardiac Chest Pain  Nonobstructive CAD  - Patient has a long history of noncardiac chest pain. Cath in 2005 showed normal coronary arteries. Stress tests in 2018, 2023 were normal. Most recently, coronary CTA in 12/2022 that showed a coronary calcium score of 15 (67th percentile). There was moderate stenosis in proximal Lcx, mild stenosis in proximal RCA, and minimal plaque in prox-mid LAD. FFR suggested nonobstructive CAD  - Continue lipitor  - Patient denies recurrence of chest pain since she was last seen in 07/2023   Dispo: Follow up in 6 months with Dr. Jens Som   Signed, Jonita Albee, PA-C

## 2023-10-10 ENCOUNTER — Other Ambulatory Visit: Payer: Self-pay | Admitting: Cardiology

## 2023-10-10 DIAGNOSIS — E7800 Pure hypercholesterolemia, unspecified: Secondary | ICD-10-CM

## 2023-10-11 ENCOUNTER — Ambulatory Visit: Payer: 59 | Attending: Cardiology | Admitting: Cardiology

## 2023-10-11 ENCOUNTER — Encounter: Payer: Self-pay | Admitting: Cardiology

## 2023-10-11 VITALS — BP 130/78 | Ht 60.0 in | Wt 166.6 lb

## 2023-10-11 DIAGNOSIS — R931 Abnormal findings on diagnostic imaging of heart and coronary circulation: Secondary | ICD-10-CM

## 2023-10-11 DIAGNOSIS — I5032 Chronic diastolic (congestive) heart failure: Secondary | ICD-10-CM | POA: Diagnosis not present

## 2023-10-11 DIAGNOSIS — F330 Major depressive disorder, recurrent, mild: Secondary | ICD-10-CM | POA: Diagnosis not present

## 2023-10-11 DIAGNOSIS — I10 Essential (primary) hypertension: Secondary | ICD-10-CM

## 2023-10-11 DIAGNOSIS — R072 Precordial pain: Secondary | ICD-10-CM | POA: Diagnosis not present

## 2023-10-11 DIAGNOSIS — E7800 Pure hypercholesterolemia, unspecified: Secondary | ICD-10-CM | POA: Diagnosis not present

## 2023-10-11 DIAGNOSIS — G4733 Obstructive sleep apnea (adult) (pediatric): Secondary | ICD-10-CM | POA: Diagnosis not present

## 2023-10-11 NOTE — Patient Instructions (Signed)
Medication Instructions:  No changes *If you need a refill on your cardiac medications before your next appointment, please call your pharmacy*   Lab Work: No labs   Testing/Procedures: No testing   Follow-Up: At [facility], you and your health needs are our priority.  As part of our continuing mission to provide you with exceptional heart care, we have created designated Provider Care Teams.  These Care Teams include your primary Cardiologist (physician) and Advanced Practice Providers (APPs -  Physician Assistants and Nurse Practitioners) who all work together to provide you with the care you need, when you need it.  We recommend signing up for the patient portal called "MyChart".  Sign up information is provided on this After Visit Summary.  MyChart is used to connect with patients for Virtual Visits (Telemedicine).  Patients are able to view lab/test results, encounter notes, upcoming appointments, etc.  Non-urgent messages can be sent to your provider as well.   To learn more about what you can do with MyChart, go to [email or web address].    Your next appointment:   6 month(s)  Provider:   Olga Millers, MD

## 2023-10-19 DIAGNOSIS — Z299 Encounter for prophylactic measures, unspecified: Secondary | ICD-10-CM | POA: Diagnosis not present

## 2023-10-19 DIAGNOSIS — I509 Heart failure, unspecified: Secondary | ICD-10-CM | POA: Diagnosis not present

## 2023-10-19 DIAGNOSIS — R0981 Nasal congestion: Secondary | ICD-10-CM | POA: Diagnosis not present

## 2023-10-19 DIAGNOSIS — J069 Acute upper respiratory infection, unspecified: Secondary | ICD-10-CM | POA: Diagnosis not present

## 2023-10-29 DIAGNOSIS — F4312 Post-traumatic stress disorder, chronic: Secondary | ICD-10-CM | POA: Diagnosis not present

## 2023-11-07 ENCOUNTER — Other Ambulatory Visit: Payer: Self-pay | Admitting: Cardiology

## 2023-11-07 DIAGNOSIS — I10 Essential (primary) hypertension: Secondary | ICD-10-CM

## 2023-11-15 ENCOUNTER — Telehealth: Payer: Self-pay | Admitting: Cardiology

## 2023-11-15 NOTE — Telephone Encounter (Signed)
 Good Morning [name] [name],   We have received a surgical clearance request for [name] [name] for right total knee arthroplasty. They were seen recently in clinic on 10/11/2023. Can you please comment on surgical clearance for his upcoming knee procedure. Please forward you guidance and recommendations to [facility]

## 2023-11-15 NOTE — Telephone Encounter (Signed)
   Pre-operative Risk Assessment    Patient Name: [name] [name]  DOB: May 05, 1961 MRN: [id number]   Date of last office visit: 10/11/2023 Date of next office visit: none   Request for Surgical Clearance    Procedure:   right total knee arthroplasty  Date of Surgery:  Clearance 02/11/24                                Surgeon:  Dr. Ollen Gross Surgeon's Group or Practice Name:  [facility] Phone number:  [phone] Fax number:  [phone] [name]   Type of Clearance Requested:   - Medical  - Pharmacy:  Hold Aspirin need instruction   Type of Anesthesia:  Not Indicated   Additional requests/questions:    [name]   11/15/2023, 9:21 AM

## 2023-11-16 NOTE — Telephone Encounter (Signed)
   Patient Name: [name] [name]  DOB: 02/03/1961 MRN: [id number]  Primary Cardiologist: Olga Millers, MD  Chart reviewed as part of pre-operative protocol coverage. Given past medical history and time since last visit, based on ACC/AHA guidelines, Declynn A Mccamy is at acceptable risk for the planned procedure without further cardiovascular testing.   The patient was advised that if she develops new symptoms prior to surgery to contact our office to arrange for a follow-up visit, and she verbalized understanding.  Regarding ASA therapy  it may be stopped 5-7 days prior to surgery with a plan to resume it as soon as felt to be feasible from a surgical standpoint in the post-operative period.   I will route this recommendation to the requesting party via Epic fax function and remove from pre-op pool.  Please call with questions.  Napoleon Form, Leodis Rains, NP 11/16/2023, 11:25 AM

## 2023-12-12 ENCOUNTER — Encounter: Payer: Self-pay | Admitting: Internal Medicine

## 2024-01-01 ENCOUNTER — Ambulatory Visit (AMBULATORY_SURGERY_CENTER)

## 2024-01-01 ENCOUNTER — Encounter: Payer: Self-pay | Admitting: Internal Medicine

## 2024-01-01 VITALS — Ht 60.0 in | Wt 164.0 lb

## 2024-01-01 DIAGNOSIS — Z860100 Personal history of colon polyps, unspecified: Secondary | ICD-10-CM

## 2024-01-01 MED ORDER — NA SULFATE-K SULFATE-MG SULF 17.5-3.13-1.6 GM/177ML PO SOLN: 1.0000 | Freq: Once | ORAL | 0 refills | Status: AC

## 2024-01-01 NOTE — Progress Notes (Signed)
 No egg or soy allergy known to patient  No issues known to pt with past sedation with any surgeries or procedures Patient denies ever being told they had issues or difficulty with intubation  No FH of Malignant Hyperthermia Pt is not on diet pills Pt is not on  home 02  OSA unable to tolerate CPAP mask  Pt is not on blood thinners  Pt denies issues with constipation  No A fib or A flutter Have any cardiac testing pending-- no  LOA: independent  Prep: suprep   Patient's chart reviewed by Rogena Class CNRA prior to previsit and patient appropriate for the [facility].  Previsit completed and red dot placed by patient's name on their procedure day (on provider's schedule).     PV completed with patient. Prep instructions sent via mychart and home address.     ONCE A WEEK INJECTIONS Ozempic,  Mounjaro, Wegovy, Trulicity, Tanzeum, Byetta, Victoza, Bydureon, & SymlinPen  -DO NOT TAKE 7 days prior to the procedure.  Last dose on or before Wednesday 5/14 failure to hold this medication will result in a cancellation or rescheduling of your procedure

## 2024-01-01 NOTE — Patient Instructions (Signed)
 ONCE A WEEK INJECTIONS Ozempic,  Mounjaro, Wegovy, Trulicity, Tanzeum, Byetta, Victoza, Bydureon, & SymlinPen  -DO NOT TAKE 7 days prior to the procedure.  Last dose on or before Wednesday 5/14 failure to hold this medication will result in a cancellation or rescheduling of your procedure

## 2024-01-07 DIAGNOSIS — E782 Mixed hyperlipidemia: Secondary | ICD-10-CM | POA: Diagnosis not present

## 2024-01-07 DIAGNOSIS — F419 Anxiety disorder, unspecified: Secondary | ICD-10-CM | POA: Diagnosis not present

## 2024-01-07 DIAGNOSIS — Z13220 Encounter for screening for lipoid disorders: Secondary | ICD-10-CM | POA: Diagnosis not present

## 2024-01-07 DIAGNOSIS — I10 Essential (primary) hypertension: Secondary | ICD-10-CM | POA: Diagnosis not present

## 2024-01-14 NOTE — H&P (Addendum)
 TOTAL KNEE ADMISSION H&P  Patient is being admitted for right total knee arthroplasty.  Subjective:  Chief Complaint: Right knee pain.  HPI: [name] [name], 63 y.o. female has a history of pain and functional disability in the right knee due to arthritis and has failed non-surgical conservative treatments for greater than 12 weeks to include NSAID's and/or analgesics, corticosteriod injections, and activity modification. Onset of symptoms was gradual, starting  several  years ago with gradually worsening course since that time. The patient noted no past surgery on the right knee.  Patient currently rates pain in the right knee at 8 out of 10 with activity. Patient has night pain, worsening of pain with activity and weight bearing, pain with passive range of motion, and crepitus. Patient has evidence of  bone-on-bone arthritis in the medial and patellofemoral compartments of the right knee  by imaging studies. There is no active infection.  Patient Active Problem List   Diagnosis Date Noted   OA (osteoarthritis) of knee 02/19/2023   Primary osteoarthritis of left knee 02/19/2023   AKI (acute kidney injury) (HCC)    Chronic diastolic heart failure (HCC)    Class 1 obesity due to excess calories with body mass index (BMI) of 31.0 to 31.9 in adult    Type 2 diabetes mellitus with diabetic neuropathy, without long-term current use of insulin  (HCC)    Gastroesophageal reflux disease    Hyperkalemia 02/08/2020   Panic disorder 03/20/2019   External hemorrhoids 12/08/2014   Rectal bleeding 11/22/2014   Hemorrhoids, internal 11/22/2014   Essential hypertension 06/08/2009   DYSPNEA 06/08/2009   ABNORMAL EKG 06/08/2009    Past Medical History:  Diagnosis Date   Abnormal EKG    Allergy    Anxiety    PTSD   Arthritis    CHF (congestive heart failure) (HCC)    Depression    Diabetes mellitus without complication (HCC)    GERD (gastroesophageal reflux disease)    History of kidney stones     Hx of absence seizures    last seizure- 1978   Hyperlipidemia    Hypertension    Unspecified   IBS (irritable bowel syndrome)    Panic attacks    severe panic attacks followed by DR Tilda Fogo when happens cannot speak   Panic disorder 03/20/2019   Pneumonia    Seizures (HCC)    Reports last seizure was 30 + years ago    Sleep apnea    no CPAP worn   Stroke (HCC)    2004  slight right eye droop    Past Surgical History:  Procedure Laterality Date   HYSTEROSCOPY  2001   submucous myomectomy   TOTAL ABDOMINAL HYSTERECTOMY  2002   TAH BSO  pathology showed leiomyoma, adenomyosis, simple and complex hyperplasia without atypia   TOTAL KNEE ARTHROPLASTY Left 02/19/2023   Procedure: TOTAL KNEE ARTHROPLASTY;  Surgeon: Liliane Rei, MD;  Location: [facility];  Service: Orthopedics;  Laterality: Left;   TUMOR REMOVAL     from left ear    Prior to Admission medications   Medication Sig Start Date End Date Taking? Authorizing Provider  ALPRAZolam  (XANAX ) 1 MG tablet Take 1 mg by mouth daily. Additional if needed up to 3 time a day    [provider]  aspirin  EC 81 MG tablet Take 81 mg by mouth at bedtime. Swallow whole.    [provider]  atorvastatin  (LIPITOR) 80 MG tablet Take 1 tablet (80 mg total) by mouth daily.  Patient taking differently: Take 80 mg by mouth at bedtime. 01/11/23 01/01/24  Lenise Quince, MD  buPROPion  (WELLBUTRIN  XL) 150 MG 24 hr tablet Take 150 mg by mouth every morning. 06/02/20   [provider]  Choline Fenofibrate (FENOFIBRIC ACID) 135 MG CPDR Take 135 mg by mouth at bedtime. 11/19/20   [provider]  citalopram  (CELEXA ) 40 MG tablet Take 40 mg by mouth daily.    [provider]  ezetimibe  (ZETIA ) 10 MG tablet Take 1 tablet (10 mg total) by mouth daily. 06/15/23   Lenise Quince, MD  furosemide  (LASIX ) 20 MG tablet Twice daily as needed if weight >3 Lbs 05/29/19   Knox Perl, MD  gabapentin  (NEURONTIN ) 400 MG capsule  Take 400 mg by mouth 2 (two) times daily.    [provider]  glimepiride  (AMARYL ) 4 MG tablet Take 4 mg by mouth daily with breakfast. 01/14/20   [provider]  JARDIANCE 10 MG TABS tablet Take 10 mg by mouth daily. 12/14/22   [provider]  LANTUS  SOLOSTAR 100 UNIT/ML Solostar Pen Inject 73 Units into the skin at bedtime. 12/27/23   [provider]  metoprolol  succinate (TOPROL -XL) 25 MG 24 hr tablet TAKE 1 TABLET BY MOUTH DAILY.TAKE WITH OR IMMEDIATELY FOLLOWING A MEAL 11/08/23   Debria Fang, PA-C  omeprazole (PRILOSEC) 20 MG capsule Take 20 mg by mouth at bedtime.  07/02/14   [provider]  OZEMPIC, 2 MG/DOSE, 8 MG/3ML SOPN Inject 2 mLs into the skin once a week. 12/19/23   [provider]  potassium chloride  SA (KLOR-CON ) 20 MEQ tablet Take 20 mEq by mouth daily as needed (With Furosemide ). Only if furosemide  taken. 02/11/20   Knox Perl, MD  spironolactone  (ALDACTONE ) 50 MG tablet TAKE ONE TABLET BY MOUTH EVERY DAY IN THE MORNING 08/08/23   Lenise Quince, MD  verapamil  (CALAN -SR) 120 MG CR tablet Take 120 mg by mouth at bedtime.  07/02/14   [provider]    Allergies  Allergen Reactions   Prednisone Other (See Comments)    Sweating   Doxycycline Rash   Erythromycin Rash    Social History   Socioeconomic History   Marital status: Married    Spouse name: Not on file   Number of children: 0   Years of education: Not on file   Highest education level: Some college, no degree  Occupational History   Occupation: Full Time    Comment: Sales  Tobacco Use   Smoking status: Never   Smokeless tobacco: Never  Vaping Use   Vaping status: Never Used  Substance and Sexual Activity   Alcohol use: No    Alcohol/week: 0.0 standard drinks of alcohol   Drug use: No   Sexual activity: Not Currently    Comment: 1st intercourse 63 yo-Fewer than 5 partners  Other Topics Concern   Not on file  Social History Narrative    Right handed   Caffeine 1-2 cups daily    Lives at home with husband with 2 dogs    Regular exercise: No   Social Drivers of Corporate investment banker Strain: Not on file  Food Insecurity: Low Risk  (01/07/2024)   Received from [facility]   Hunger Vital Sign    Worried About Running Out of Food in the Last Year: Never true    Ran Out of Food in the Last Year: Never true  Transportation Needs: No Transportation Needs (01/07/2024)   Received  from [facility]    In the past 12 months, has lack of reliable transportation kept you from medical appointments, meetings, work or from getting things needed for daily living? : No  Physical Activity: Not on file  Stress: Not on file  Social Connections: Not on file  Intimate Partner Violence: Not At Risk (02/19/2023)   Humiliation, Afraid, Rape, and Kick questionnaire    Fear of Current or Ex-Partner: No    Emotionally Abused: No    Physically Abused: No    Sexually Abused: No    Tobacco Use: Low Risk  (01/01/2024)   Patient History    Smoking Tobacco Use: Never    Smokeless Tobacco Use: Never    Passive Exposure: Not on file   Social History   Substance and Sexual Activity  Alcohol Use No   Alcohol/week: 0.0 standard drinks of alcohol    Family History  Problem Relation Age of Onset   Uterine cancer Mother    Irritable bowel syndrome Mother    Stroke Mother    Osteoporosis Mother    Cancer Father        Prostate   High blood pressure Father    Heart disease Maternal Grandmother    Coronary artery disease Paternal Grandfather    Heart attack Brother 12   Cancer Brother        Prostate   Colitis Neg Hx    Esophageal cancer Neg Hx    Stomach cancer Neg Hx    Rectal cancer Neg Hx    Breast cancer Neg Hx     Review of Systems  Constitutional:  Negative for chills and fever.  HENT:  Negative for congestion, sore throat and tinnitus.   Eyes:  Negative for double vision, photophobia and pain.   Respiratory:  Negative for cough, shortness of breath and wheezing.   Cardiovascular:  Negative for chest pain, palpitations and orthopnea.  Gastrointestinal:  Negative for heartburn, nausea and vomiting.  Genitourinary:  Negative for dysuria, frequency and urgency.  Musculoskeletal:  Positive for joint pain.  Neurological:  Negative for dizziness, weakness and headaches.    Objective:  Physical Exam: Well nourished and well developed.  General: Alert and oriented x3, cooperative and pleasant, no acute distress.  Head: normocephalic, atraumatic, neck supple.  Eyes: EOMI.  Musculoskeletal:  Her right knee shows no effusion. She has slight varus on the right. Range of motion is 0 to 125 degrees with crepitus on range of motion. She is tender medially. There is slight lateral tenderness. There is no instability noted.  Calves soft and nontender. Motor function intact in LE. Strength 5/5 LE bilaterally. Neuro: Distal pulses 2+. Sensation to light touch intact in LE.  Imaging Review Plain radiographs demonstrate moderate degenerative joint disease of the right knee. The overall alignment is neutral. The bone quality appears to be adequate for age and reported activity level.  Assessment/Plan:  End stage arthritis, right knee   The patient history, physical examination, clinical judgment of the provider and imaging studies are consistent with end stage degenerative joint disease of the right knee and total knee arthroplasty is deemed medically necessary. The treatment options including medical management, injection therapy arthroscopy and arthroplasty were discussed at length. The risks and benefits of total knee arthroplasty were presented and reviewed. The risks due to aseptic loosening, infection, stiffness, patella tracking problems, thromboembolic complications and other imponderables were discussed. The patient acknowledged the explanation, agreed to proceed with the plan and consent  was signed. Patient is being admitted for inpatient treatment for surgery, pain control, PT, OT, prophylactic antibiotics, VTE prophylaxis, progressive ambulation and ADLs and discharge planning. The patient is planning to be discharged  home .   Patient's anticipated LOS is less than 2 midnights, meeting these requirements: - Younger than 60 - Lives within 1 hour of care - Has a competent adult at home to recover with post-op recover - NO history of  - Chronic pain requiring opioids  - Coronary Artery Disease  - Heart attack  - Stroke  - DVT/VTE  - Cardiac arrhythmia  - Respiratory Failure/COPD  - Renal failure  - Anemia  - Advanced Liver disease  Therapy Plans: Outpatient therapy at [facility] Disposition: Home with husband Planned DVT Prophylaxis: Aspirin  81 mg BID DME Needed: Otho Blitz PCP: Alvia Awkward, MD (clearance received) Cardiologist: Alexandria Angel, MD (clearance received) TXA: IV Allergies: Prednisone, doxycycline, erythromycin, olmesartan Metal Allergy: None Anesthesia Concerns: None BMI: 32.5 Last HgbA1c: 7.1% (09/2023) - rechecking w/ preop labs Pain Regimen: Dilaudid , cyclobenzaprine ** [facility]: [facility]  Other: - Hold glimepiride  day prior to surgery, ozempic 7 days prior, ASA 5 days prior - PMH: DM, CHF  - Patient was instructed on what medications to stop prior to surgery. - Follow-up visit in 2 weeks with Dr. France Ina - Begin physical therapy following surgery - Pre-operative lab work as pre-surgical testing - Prescriptions will be provided in hospital at time of discharge  Sharlynn Dear, PA-C Orthopedic Surgery [facility] [place]

## 2024-01-15 ENCOUNTER — Other Ambulatory Visit: Payer: Self-pay | Admitting: Cardiology

## 2024-01-15 DIAGNOSIS — E7800 Pure hypercholesterolemia, unspecified: Secondary | ICD-10-CM

## 2024-01-29 ENCOUNTER — Telehealth: Payer: Self-pay | Admitting: Internal Medicine

## 2024-01-29 NOTE — Telephone Encounter (Signed)
 Spoke with pt. Only symptoms is congestion. Pt ok to proceed. Directed to call back if she starts to run a fever or GI symptoms,

## 2024-01-29 NOTE — Patient Instructions (Signed)
 SURGICAL WAITING ROOM VISITATION  Patients having surgery or a procedure may have no more than 2 support people in the waiting area - these visitors may rotate.    Children under the age of 7 must have an adult with them who is not the patient.  Due to an increase in RSV and influenza rates and associated hospitalizations, children ages 63 and under may not visit patients in [facility] hospitals.  Visitors with respiratory illnesses are discouraged from visiting and should remain at home.  If the patient needs to stay at the hospital during part of their recovery, the visitor guidelines for inpatient rooms apply. Pre-op nurse will coordinate an appropriate time for 1 support person to accompany patient in pre-op.  This support person may not rotate.    Please refer to the [facility] website for the visitor guidelines for Inpatients (after your surgery is over and you are in a regular room).       Your procedure is scheduled on:  02/11/2024    Report to [facility] Main Entrance    Report to admitting at   [phone]   Call this number if you have problems the morning of surgery [phone]   Do not eat food :After Midnight.   After Midnight you may have the following liquids until _ 0615am _____ DAY OF SURGERY  Water Non-Citrus Juices (without pulp, NO RED-Apple, White grape, White cranberry) Black Coffee (NO MILK/CREAM OR CREAMERS, sugar ok)  Clear Tea (NO MILK/CREAM OR CREAMERS, sugar ok) regular and decaf                             Plain Jell-O (NO RED)                                           Fruit ices (not with fruit pulp, NO RED)                                     Popsicles (NO RED)                                                               Sports drinks like Gatorade (NO RED)      The day of surgery:  Drink ONE (1) Pre-Surgery Clear Ensure or G2 at  8413KG ( have completed by )  the morning of surgery. Drink in one sitting. Do not sip.  This drink was  given to you during your hospital  pre-op appointment visit. Nothing else to drink after completing the  Pre-Surgery Clear Ensure or G2.          If you have questions, please contact your surgeon's office.       Oral Hygiene is also important to reduce your risk of infection.                                    Remember - BRUSH YOUR TEETH THE MORNING OF SURGERY WITH YOUR  REGULAR TOOTHPASTE  DENTURES WILL BE REMOVED PRIOR TO SURGERY PLEASE DO NOT APPLY "Poly grip" OR ADHESIVES!!!   Do NOT smoke after Midnight   Stop all vitamins and herbal supplements 7 days before surgery.   Take these medicines the morning of surgery with A SIP OF WATER:  xanax  if needed, wellbutrin , celexa , toprol , gabapentin                 Amaryl - none am of surgery               Jardiance- hold for 72 hours prior to surgery last dose on 02/07/24               Lantus - take 1/2 dose nite before surgery              Ozempic- none for 7 days prior to surgery last dose on   DO NOT TAKE ANY ORAL DIABETIC MEDICATIONS DAY OF YOUR SURGERY  Bring CPAP mask and tubing day of surgery.                              You may not have any metal on your body including hair pins, jewelry, and body piercing             Do not wear make-up, lotions, powders, perfumes/cologne, or deodorant  Do not wear nail polish including gel and S&S, artificial/acrylic nails, or any other type of covering on natural nails including finger and toenails. If you have artificial nails, gel coating, etc. that needs to be removed by a nail salon please have this removed prior to surgery or surgery may need to be canceled/ delayed if the surgeon/ anesthesia feels like they are unable to be safely monitored.   Do not shave  48 hours prior to surgery.               Men may shave face and neck.   Do not bring valuables to the hospital. [facility] IS NOT             RESPONSIBLE   FOR VALUABLES.   Contacts, glasses, dentures or bridgework may not be  worn into surgery.   Bring small overnight bag day of surgery.   DO NOT BRING YOUR HOME MEDICATIONS TO THE HOSPITAL. PHARMACY WILL DISPENSE MEDICATIONS LISTED ON YOUR MEDICATION LIST TO YOU DURING YOUR ADMISSION IN THE HOSPITAL!    Patients discharged on the day of surgery will not be allowed to drive home.  Someone NEEDS to stay with you for the first 24 hours after anesthesia.   Special Instructions: Bring a copy of your healthcare power of attorney and living will documents the day of surgery if you haven't scanned them before.              Please read over the following fact sheets you were given: IF YOU HAVE QUESTIONS ABOUT YOUR PRE-OP INSTRUCTIONS PLEASE CALL [phone]   If you received a COVID test during your pre-op visit  it is requested that you wear a mask when out in public, stay away from anyone that may not be feeling well and notify your surgeon if you develop symptoms. If you test positive for Covid or have been in contact with anyone that has tested positive in the last 10 days please notify you surgeon.      Pre-operative 5 CHG Bath Instructions   You can play a key role in  reducing the risk of infection after surgery. Your skin needs to be as free of germs as possible. You can reduce the number of germs on your skin by washing with CHG (chlorhexidine  gluconate) soap before surgery. CHG is an antiseptic soap that kills germs and continues to kill germs even after washing.   DO NOT use if you have an allergy to chlorhexidine /CHG or antibacterial soaps. If your skin becomes reddened or irritated, stop using the CHG and notify one of our RNs at [phone].   Please shower with the CHG soap starting 4 days before surgery using the following schedule:     Please keep in mind the following:  DO NOT shave, including legs and underarms, starting the day of your first shower.   You may shave your face at any point before/day of surgery.  Place clean sheets on your bed the  day you start using CHG soap. Use a clean washcloth (not used since being washed) for each shower. DO NOT sleep with pets once you start using the CHG.   CHG Shower Instructions:  If you choose to wash your hair and private area, wash first with your normal shampoo/soap.  After you use shampoo/soap, rinse your hair and body thoroughly to remove shampoo/soap residue.  Turn the water OFF and apply about 3 tablespoons (45 ml) of CHG soap to a CLEAN washcloth.  Apply CHG soap ONLY FROM YOUR NECK DOWN TO YOUR TOES (washing for 3-5 minutes)  DO NOT use CHG soap on face, private areas, open wounds, or sores.  Pay special attention to the area where your surgery is being performed.  If you are having back surgery, having someone wash your back for you may be helpful. Wait 2 minutes after CHG soap is applied, then you may rinse off the CHG soap.  Pat dry with a clean towel  Put on clean clothes/pajamas   If you choose to wear lotion, please use ONLY the CHG-compatible lotions on the back of this paper.     Additional instructions for the day of surgery: DO NOT APPLY any lotions, deodorants, cologne, or perfumes.   Put on clean/comfortable clothes.  Brush your teeth.  Ask your nurse before applying any prescription medications to the skin.      CHG Compatible Lotions   Aveeno Moisturizing lotion  Cetaphil Moisturizing Cream  Cetaphil Moisturizing Lotion  Clairol Herbal Essence Moisturizing Lotion, Dry Skin  Clairol Herbal Essence Moisturizing Lotion, Extra Dry Skin  Clairol Herbal Essence Moisturizing Lotion, Normal Skin  Curel Age Defying Therapeutic Moisturizing Lotion with Alpha Hydroxy  Curel Extreme Care Body Lotion  Curel Soothing Hands Moisturizing Hand Lotion  Curel Therapeutic Moisturizing Cream, Fragrance-Free  Curel Therapeutic Moisturizing Lotion, Fragrance-Free  Curel Therapeutic Moisturizing Lotion, Original Formula  Eucerin Daily Replenishing Lotion  Eucerin Dry Skin  Therapy Plus Alpha Hydroxy Crme  Eucerin Dry Skin Therapy Plus Alpha Hydroxy Lotion  Eucerin Original Crme  Eucerin Original Lotion  Eucerin Plus Crme Eucerin Plus Lotion  Eucerin TriLipid Replenishing Lotion  Keri Anti-Bacterial Hand Lotion  Keri Deep Conditioning Original Lotion Dry Skin Formula Softly Scented  Keri Deep Conditioning Original Lotion, Fragrance Free Sensitive Skin Formula  Keri Lotion Fast Absorbing Fragrance Free Sensitive Skin Formula  Keri Lotion Fast Absorbing Softly Scented Dry Skin Formula  Keri Original Lotion  Keri Skin Renewal Lotion Keri Silky Smooth Lotion  Keri Silky Smooth Sensitive Skin Lotion  Nivea Body Creamy Conditioning Oil  Nivea Body Extra Enriched Education administrator  Original Market researcher Moisturizing Lotion Nivea Crme  Nivea Skin Firming Lotion  NutraDerm 30 Skin Lotion  NutraDerm Skin Lotion  NutraDerm Therapeutic Skin Cream  NutraDerm Therapeutic Skin Lotion  ProShield Protective Hand Cream  Provon moisturizing lotion

## 2024-01-29 NOTE — Telephone Encounter (Signed)
 Inbound call from patient stating that she currently has a sinus infection and requesting a call to discuss how to proceed with 5/22 colonoscopy. Please advise, thank you.

## 2024-01-29 NOTE — Progress Notes (Addendum)
 Anesthesia Review:  PCP: [facility] in [place]  Cardiologist : Alexandria Angel Friddie Jetty- LOV 06/12/23  Warrick Habermann telephone clearance 11/15/23  Vasc Surg- Paxel Marcella Serge- LOV 01/07/24   PPM/ ICD: Device Orders: Rep Notified:  Chest x-ray : EKG : 07/19/23  and 02/01/24  CT Cors- 01/09/23  Echo : 01/29/22  Stress test: 2018  Cardiac Cath :   Activity level: can do a flight of stairs wtihout difficutly  Sleep Study/ CPAP : has sleep apnea no cpap  Fasting Blood Sugar :      / Checks Blood Sugar -- times a day:     DM- type2- checks  glucose am and pm per pt  Hgba1c- 02/01/24- 6.6 Amaryl - none am of surgery  Jardiance- hold for 72 hours prior to surgery last dose on -  02/07/24  Lantus - hs- Take 1/2 dose nite before surgery  Ozempic- hold for 7 days prior to surgery. Last dose on 01/20/2024   Blood Thinner/ Instructions /Last Dose: ASA / Instructions/ Last Dose :   81 mg aspirin     PT in for preop on 02/01/24 at 1300.  At end of preop pt stated when walking up the hill to come to Admitting had episode of left arm pain which caused her left hand to cramp.  No associated dizziness, diaphoresis or chest pain. AT time pt inforemd preop nurse pt was not having any symptoms.    PT has had this left arm pain before prior to being diagnosed with CHF in 2022.  PT with hx of stroke in 2004 .  PT drove herself to preop.  Sharlyn Deaner made aware of above and vital signs.  PT denies any sob.   EKG done.  [facility] aware of EKG results.  PT will seek medical help if has any further symptoms by calling cardiology or going to ER.  Pt voiced understanding.  Shuttle was called to take pt to car from Entrance of hospital.     02/2023 left knee    PT refused G@ lower sugar at preop due to salt content.     Called pt on 02/01/24 at 1653pm to check on status.  LVMM

## 2024-01-31 ENCOUNTER — Ambulatory Visit (AMBULATORY_SURGERY_CENTER): Admitting: Internal Medicine

## 2024-01-31 ENCOUNTER — Encounter: Payer: Self-pay | Admitting: Internal Medicine

## 2024-01-31 VITALS — BP 120/65 | HR 59 | Temp 97.6°F | Resp 9 | Ht 60.0 in | Wt 166.2 lb

## 2024-01-31 DIAGNOSIS — K635 Polyp of colon: Secondary | ICD-10-CM

## 2024-01-31 DIAGNOSIS — Z860100 Personal history of colon polyps, unspecified: Secondary | ICD-10-CM

## 2024-01-31 DIAGNOSIS — K5730 Diverticulosis of large intestine without perforation or abscess without bleeding: Secondary | ICD-10-CM | POA: Diagnosis not present

## 2024-01-31 DIAGNOSIS — K648 Other hemorrhoids: Secondary | ICD-10-CM

## 2024-01-31 DIAGNOSIS — Z1211 Encounter for screening for malignant neoplasm of colon: Secondary | ICD-10-CM | POA: Diagnosis present

## 2024-01-31 DIAGNOSIS — D123 Benign neoplasm of transverse colon: Secondary | ICD-10-CM

## 2024-01-31 MED ORDER — SODIUM CHLORIDE 0.9 % IV SOLN
500.0000 mL | Freq: Once | INTRAVENOUS | Status: DC
Start: 2024-01-31 — End: 2024-01-31

## 2024-01-31 NOTE — Patient Instructions (Addendum)
Resume previous diet Continue present medications Await pathology results Handouts/ information given for polyps, diverticulosis and hemorrhoids  YOU HAD AN ENDOSCOPIC PROCEDURE TODAY AT THE [facility]:   Refer to the procedure report that was given to you for any specific questions about what was found during the examination.  If the procedure report does not answer your questions, please call your gastroenterologist to clarify.  If you requested that your care partner not be given the details of your procedure findings, then the procedure report has been included in a sealed envelope for you to review at your convenience later.  YOU SHOULD EXPECT: Some feelings of bloating in the abdomen. Passage of more gas than usual.  Walking can help get rid of the air that was put into your GI tract during the procedure and reduce the bloating. If you had a lower endoscopy (such as a colonoscopy or flexible sigmoidoscopy) you may notice spotting of blood in your stool or on the toilet paper. If you underwent a bowel prep for your procedure, you may not have a normal bowel movement for a few days.  Please Note:  You might notice some irritation and congestion in your nose or some drainage.  This is from the oxygen used during your procedure.  There is no need for concern and it should clear up in a day or so.  SYMPTOMS TO REPORT IMMEDIATELY:  Following lower endoscopy (colonoscopy or flexible sigmoidoscopy):  Excessive amounts of blood in the stool  Significant tenderness or worsening of abdominal pains  Swelling of the abdomen that is new, acute  Fever of 100F or higher  For urgent or emergent issues, a gastroenterologist can be reached at any hour by calling [phone]. Do not use MyChart messaging for urgent concerns.    DIET:  We do recommend a small meal at first, but then you may proceed to your regular diet.  Drink plenty of fluids but you should avoid alcoholic beverages for 24  hours.  ACTIVITY:  You should plan to take it easy for the rest of today and you should NOT DRIVE or use heavy machinery until tomorrow (because of the sedation medicines used during the test).    FOLLOW UP: Our staff will call the number listed on your records the next business day following your procedure.  We will call around 7:15- 8:00 am to check on you and address any questions or concerns that you may have regarding the information given to you following your procedure. If we do not reach you, we will leave a message.     If any biopsies were taken you will be contacted by phone or by letter within the next 1-3 weeks.  Please call us at [phone] if you have not heard about the biopsies in 3 weeks.    SIGNATURES/CONFIDENTIALITY: You and/or your care partner have signed paperwork which will be entered into your electronic medical record.  These signatures attest to the fact that that the information above on your After Visit Summary has been reviewed and is understood.  Full responsibility of the confidentiality of this discharge information lies with you and/or your care-partner. 

## 2024-01-31 NOTE — Progress Notes (Signed)
 Report given to PACU, vss

## 2024-01-31 NOTE — Op Note (Signed)
 [facility] Patient Name: [name] [name] Procedure Date: 01/31/2024 9:01 AM MRN: [id number] Endoscopist: Nannette Babe , MD, [id number] Age: 63 Referring MD:  Date of Birth: [birth year] Gender: Female Account #: [id number] Procedure:                Colonoscopy Indications:              High risk colon cancer surveillance: Personal                            history of multiple (3 or more) adenomas, Last                            colonoscopy: August 2019 (TA x 1), April 2016 (TA x                            4) Medicines:                Monitored Anesthesia Care Procedure:                Pre-Anesthesia Assessment:                           - Prior to the procedure, a History and Physical                            was performed, and patient medications and                            allergies were reviewed. The patient's tolerance of                            previous anesthesia was also reviewed. The risks                            and benefits of the procedure and the sedation                            options and risks were discussed with the patient.                            All questions were answered, and informed consent                            was obtained. Prior Anticoagulants: The patient has                            taken no anticoagulant or antiplatelet agents. ASA                            Grade Assessment: III - A patient with severe                            systemic disease. After reviewing the risks and  benefits, the patient was deemed in satisfactory                            condition to undergo the procedure.                           After obtaining informed consent, the colonoscope                            was passed under direct vision. Throughout the                            procedure, the patient's blood pressure, pulse, and                            oxygen saturations were monitored continuously. The                             PCF-HQ190L Colonoscope [id number] was introduced                            through the anus and advanced to the cecum,                            identified by appendiceal orifice and ileocecal                            valve. The colonoscopy was performed without                            difficulty. The patient tolerated the procedure                            well. The quality of the bowel preparation was                            good. The ileocecal valve, appendiceal orifice, and                            rectum were photographed. Scope In: 9:15:57 AM Scope Out: 9:38:53 AM Scope Withdrawal Time: 0 hours 17 minutes 38 seconds  Total Procedure Duration: 0 hours 22 minutes 56 seconds  Findings:                 The digital rectal exam was normal.                           Two sessile polyps were found in the transverse                            colon. The polyps were 3 to 5 mm in size. These                            polyps were removed with a cold snare. Resection  and retrieval were complete.                           Multiple medium-mouthed and small-mouthed                            diverticula were found in the sigmoid colon and                            descending colon.                           Internal hemorrhoids were found during                            retroflexion. The hemorrhoids were small. Complications:            No immediate complications. Estimated Blood Loss:     Estimated blood loss was minimal. Impression:               - Two 3 to 5 mm polyps in the transverse colon,                            removed with a cold snare. Resected and retrieved.                           - Mild diverticulosis in the sigmoid colon and in                            the descending colon.                           - Small internal hemorrhoids. Recommendation:           - Patient has a contact number available for                             emergencies. The signs and symptoms of potential                            delayed complications were discussed with the                            patient. Return to normal activities tomorrow.                            Written discharge instructions were provided to the                            patient.                           - Resume previous diet.                           - Continue present medications.                           -  Await pathology results.                           - Repeat colonoscopy is recommended for                            surveillance. The colonoscopy date will be                            determined after pathology results from today's                            exam become available for review. Nannette Babe, MD 01/31/2024 9:41:18 AM This report has been signed electronically.

## 2024-01-31 NOTE — Progress Notes (Signed)
 Called to room to assist during endoscopic procedure.  Patient ID and intended procedure confirmed with present staff. Received instructions for my participation in the procedure from the performing physician.

## 2024-01-31 NOTE — Progress Notes (Signed)
 Pt's states no medical or surgical changes since previsit or office visit.

## 2024-01-31 NOTE — Progress Notes (Signed)
 GASTROENTEROLOGY PROCEDURE H&P NOTE   Primary Care Physician: Orlena Bitters, MD    Reason for Procedure:  History of adenomatous colon polyps  Plan:    Colonoscopy  Patient is appropriate for endoscopic procedure(s) in the ambulatory ([facility]) setting.  The nature of the procedure, as well as the risks, benefits, and alternatives were carefully and thoroughly reviewed with the patient. Ample time for discussion and questions allowed. The patient understood, was satisfied, and agreed to proceed.     HPI: [name] [name] is a 63 y.o. female who presents for surveillance colonoscopy.  Medical history as below.  Tolerated the prep.  No recent chest pain or shortness of breath.  No abdominal pain today.  Past Medical History:  Diagnosis Date   Abnormal EKG    Allergy    Anxiety    PTSD   Arthritis    CHF (congestive heart failure) (HCC)    Depression    Diabetes mellitus without complication (HCC)    GERD (gastroesophageal reflux disease)    History of kidney stones    Hx of absence seizures    last seizure- 1978   Hyperlipidemia    Hypertension    Unspecified   IBS (irritable bowel syndrome)    Panic attacks    severe panic attacks followed by DR Tilda Fogo when happens cannot speak   Panic disorder 03/20/2019   Pneumonia    Seizures (HCC)    Reports last seizure was 30 + years ago    Sleep apnea    no CPAP worn   Stroke (HCC)    2004  slight right eye droop    Past Surgical History:  Procedure Laterality Date   HYSTEROSCOPY  2001   submucous myomectomy   TOTAL ABDOMINAL HYSTERECTOMY  2002   TAH BSO  pathology showed leiomyoma, adenomyosis, simple and complex hyperplasia without atypia   TOTAL KNEE ARTHROPLASTY Left 02/19/2023   Procedure: TOTAL KNEE ARTHROPLASTY;  Surgeon: Liliane Rei, MD;  Location: [facility];  Service: Orthopedics;  Laterality: Left;   TUMOR REMOVAL     from left ear    Prior to Admission medications   Medication Sig Start Date End Date Taking?  Authorizing Provider  acetaminophen  (TYLENOL ) 500 MG tablet Take 500 mg by mouth every 6 (six) hours as needed for headache.   Yes [provider]  ALPRAZolam  (XANAX ) 1 MG tablet Take 1 mg by mouth daily. May take an additional 1 mg dose twice daily as needed for anxiety   Yes [provider]  atorvastatin  (LIPITOR) 80 MG tablet TAKE ONE TABLET BY MOUTH EVERY DAY 01/15/24  Yes Lenise Quince, MD  buPROPion  (WELLBUTRIN  XL) 150 MG 24 hr tablet Take 150 mg by mouth every morning. 06/02/20  Yes [provider]  Choline Fenofibrate (FENOFIBRIC ACID) 135 MG CPDR Take 135 mg by mouth at bedtime. 11/19/20  Yes [provider]  citalopram  (CELEXA ) 40 MG tablet Take 40 mg by mouth daily.   Yes [provider]  ezetimibe  (ZETIA ) 10 MG tablet Take 1 tablet (10 mg total) by mouth daily. 06/15/23  Yes Lenise Quince, MD  gabapentin  (NEURONTIN ) 400 MG capsule Take 400 mg by mouth 2 (two) times daily. May take a third 400 mg dose as needed for pain   Yes [provider]  glimepiride  (AMARYL ) 4 MG tablet Take 4 mg by mouth 2 (two) times daily. 01/14/20  Yes [provider]  ibuprofen (ADVIL) 200 MG tablet Take 400 mg  by mouth every 6 (six) hours as needed for moderate pain (pain score 4-6).   Yes [provider]  JARDIANCE 10 MG TABS tablet Take 10 mg by mouth daily. 12/14/22  Yes [provider]  LANTUS  SOLOSTAR 100 UNIT/ML Solostar Pen Inject 73 Units into the skin at bedtime. 12/27/23  Yes [provider]  metoprolol  succinate (TOPROL -XL) 25 MG 24 hr tablet TAKE 1 TABLET BY MOUTH DAILY.TAKE WITH OR IMMEDIATELY FOLLOWING A MEAL 11/08/23  Yes Debria Fang, PA-C  omeprazole (PRILOSEC) 20 MG capsule Take 20 mg by mouth at bedtime.  07/02/14  Yes [provider]  spironolactone  (ALDACTONE ) 50 MG tablet TAKE ONE TABLET BY MOUTH EVERY DAY IN THE MORNING 08/08/23  Yes Crenshaw, Deannie Fabian, MD  verapamil  (CALAN -SR) 120 MG CR  tablet Take 120 mg by mouth at bedtime.  07/02/14  Yes [provider]  aspirin  EC 81 MG tablet Take 81 mg by mouth at bedtime. Swallow whole.    [provider]  furosemide  (LASIX ) 20 MG tablet Twice daily as needed if weight >3 Lbs 05/29/19   Knox Perl, MD  OZEMPIC, 2 MG/DOSE, 8 MG/3ML SOPN Inject 2 mLs into the skin every Sunday. 12/19/23   [provider]  potassium chloride  SA (KLOR-CON ) 20 MEQ tablet Take 20 mEq by mouth daily as needed (With Furosemide ). Only if furosemide  taken. 02/11/20   Knox Perl, MD    Current Outpatient Medications  Medication Sig Dispense Refill   acetaminophen  (TYLENOL ) 500 MG tablet Take 500 mg by mouth every 6 (six) hours as needed for headache.     ALPRAZolam  (XANAX ) 1 MG tablet Take 1 mg by mouth daily. May take an additional 1 mg dose twice daily as needed for anxiety     atorvastatin  (LIPITOR) 80 MG tablet TAKE ONE TABLET BY MOUTH EVERY DAY 90 tablet 3   buPROPion  (WELLBUTRIN  XL) 150 MG 24 hr tablet Take 150 mg by mouth every morning.     Choline Fenofibrate (FENOFIBRIC ACID) 135 MG CPDR Take 135 mg by mouth at bedtime.     citalopram  (CELEXA ) 40 MG tablet Take 40 mg by mouth daily.     ezetimibe  (ZETIA ) 10 MG tablet Take 1 tablet (10 mg total) by mouth daily. 90 tablet 3   gabapentin  (NEURONTIN ) 400 MG capsule Take 400 mg by mouth 2 (two) times daily. May take a third 400 mg dose as needed for pain     glimepiride  (AMARYL ) 4 MG tablet Take 4 mg by mouth 2 (two) times daily.     ibuprofen (ADVIL) 200 MG tablet Take 400 mg by mouth every 6 (six) hours as needed for moderate pain (pain score 4-6).     JARDIANCE 10 MG TABS tablet Take 10 mg by mouth daily.     LANTUS  SOLOSTAR 100 UNIT/ML Solostar Pen Inject 73 Units into the skin at bedtime.     metoprolol  succinate (TOPROL -XL) 25 MG 24 hr tablet TAKE 1 TABLET BY MOUTH DAILY.TAKE WITH OR IMMEDIATELY FOLLOWING A MEAL 90 tablet 3   omeprazole (PRILOSEC) 20 MG capsule Take 20 mg by mouth  at bedtime.   9   spironolactone  (ALDACTONE ) 50 MG tablet TAKE ONE TABLET BY MOUTH EVERY DAY IN THE MORNING 90 tablet 3   verapamil  (CALAN -SR) 120 MG CR tablet Take 120 mg by mouth at bedtime.   11   aspirin  EC 81 MG tablet Take 81 mg by mouth at bedtime. Swallow whole.     furosemide  (LASIX )  20 MG tablet Twice daily as needed if weight >3 Lbs 180 tablet 0   OZEMPIC, 2 MG/DOSE, 8 MG/3ML SOPN Inject 2 mLs into the skin every Sunday.     potassium chloride  SA (KLOR-CON ) 20 MEQ tablet Take 20 mEq by mouth daily as needed (With Furosemide ). Only if furosemide  taken. 180 tablet 1   Current Facility-Administered Medications  Medication Dose Route Frequency Provider Last Rate Last Admin   0.9 %  sodium chloride  infusion  500 mL Intravenous Once Emiliya Chretien, Amber Bail, MD        Allergies as of 01/31/2024 - Review Complete 01/31/2024  Allergen Reaction Noted   Doxycycline Rash 07/13/2014   Erythromycin Rash 07/13/2014   Olmesartan Dermatitis 09/17/2013   Prednisone Other (See Comments) 07/31/2022    Family History  Problem Relation Age of Onset   Uterine cancer Mother    Irritable bowel syndrome Mother    Stroke Mother    Osteoporosis Mother    Cancer Father        Prostate   High blood pressure Father    Heart attack Brother 15   Cancer Brother        Prostate   Heart disease Maternal Grandmother    Coronary artery disease Paternal Grandfather    Colitis Neg Hx    Esophageal cancer Neg Hx    Stomach cancer Neg Hx    Rectal cancer Neg Hx    Breast cancer Neg Hx    Colon cancer Neg Hx    Colon polyps Neg Hx     Social History   Socioeconomic History   Marital status: Married    Spouse name: Not on file   Number of children: 0   Years of education: Not on file   Highest education level: Some college, no degree  Occupational History   Occupation: Full Time    Comment: Sales  Tobacco Use   Smoking status: Never   Smokeless tobacco: Never  Vaping Use   Vaping status: Never Used   Substance and Sexual Activity   Alcohol use: No    Alcohol/week: 0.0 standard drinks of alcohol   Drug use: No   Sexual activity: Not Currently    Comment: 1st intercourse 63 yo-Fewer than 5 partners  Other Topics Concern   Not on file  Social History Narrative   Right handed   Caffeine 1-2 cups daily    Lives at home with husband with 2 dogs    Regular exercise: No   Social Drivers of Corporate investment banker Strain: Not on file  Food Insecurity: Low Risk  (01/07/2024)   Received from [facility]   Hunger Vital Sign    Worried About Running Out of Food in the Last Year: Never true    Ran Out of Food in the Last Year: Never true  Transportation Needs: No Transportation Needs (01/07/2024)   Received from [facility]    In the past 12 months, has lack of reliable transportation kept you from medical appointments, meetings, work or from getting things needed for daily living? : No  Physical Activity: Not on file  Stress: Not on file  Social Connections: Not on file  Intimate Partner Violence: Not At Risk (02/19/2023)   Humiliation, Afraid, Rape, and Kick questionnaire    Fear of Current or Ex-Partner: No    Emotionally Abused: No    Physically Abused: No    Sexually Abused: No    Physical Exam: Vital signs  in last 24 hours: @BP  (!) 141/86   Pulse 60   Temp 97.6 F (36.4 C)   Ht 5' (1.524 m)   Wt 166 lb 3.2 oz (75.4 kg)   SpO2 97%   BMI 32.46 kg/m  GEN: NAD EYE: Sclerae anicteric ENT: MMM CV: Non-tachycardic Pulm: CTA b/l GI: Soft, NT/ND NEURO:  Alert & Oriented x 3   Laurell Pond, MD [facility]  01/31/2024 9:06 AM

## 2024-02-01 ENCOUNTER — Other Ambulatory Visit: Payer: Self-pay

## 2024-02-01 ENCOUNTER — Telehealth: Payer: Self-pay

## 2024-02-01 ENCOUNTER — Encounter (HOSPITAL_COMMUNITY): Payer: Self-pay

## 2024-02-01 ENCOUNTER — Encounter (HOSPITAL_COMMUNITY)
Admission: RE | Admit: 2024-02-01 | Discharge: 2024-02-01 | Disposition: A | Discharge status: Home / Self Care | Source: Ambulatory Visit | Attending: Orthopedic Surgery | Admitting: Orthopedic Surgery

## 2024-02-01 VITALS — BP 143/52 | HR 70 | Temp 98.8°F | Resp 16 | Ht 60.0 in | Wt 165.0 lb

## 2024-02-01 DIAGNOSIS — G4733 Obstructive sleep apnea (adult) (pediatric): Secondary | ICD-10-CM | POA: Insufficient documentation

## 2024-02-01 DIAGNOSIS — Z794 Long term (current) use of insulin: Secondary | ICD-10-CM | POA: Insufficient documentation

## 2024-02-01 DIAGNOSIS — I5032 Chronic diastolic (congestive) heart failure: Secondary | ICD-10-CM | POA: Insufficient documentation

## 2024-02-01 DIAGNOSIS — E119 Type 2 diabetes mellitus without complications: Secondary | ICD-10-CM | POA: Insufficient documentation

## 2024-02-01 DIAGNOSIS — M1711 Unilateral primary osteoarthritis, right knee: Secondary | ICD-10-CM | POA: Insufficient documentation

## 2024-02-01 DIAGNOSIS — Z01812 Encounter for preprocedural laboratory examination: Secondary | ICD-10-CM | POA: Diagnosis present

## 2024-02-01 DIAGNOSIS — F32A Depression, unspecified: Secondary | ICD-10-CM | POA: Diagnosis not present

## 2024-02-01 DIAGNOSIS — R9431 Abnormal electrocardiogram [ECG] [EKG]: Secondary | ICD-10-CM | POA: Diagnosis not present

## 2024-02-01 DIAGNOSIS — Z01818 Encounter for other preprocedural examination: Secondary | ICD-10-CM | POA: Diagnosis not present

## 2024-02-01 DIAGNOSIS — Z7984 Long term (current) use of oral hypoglycemic drugs: Secondary | ICD-10-CM | POA: Insufficient documentation

## 2024-02-01 DIAGNOSIS — I2510 Atherosclerotic heart disease of native coronary artery without angina pectoris: Secondary | ICD-10-CM | POA: Insufficient documentation

## 2024-02-01 DIAGNOSIS — F419 Anxiety disorder, unspecified: Secondary | ICD-10-CM | POA: Diagnosis not present

## 2024-02-01 DIAGNOSIS — I110 Hypertensive heart disease with heart failure: Secondary | ICD-10-CM | POA: Insufficient documentation

## 2024-02-01 DIAGNOSIS — K219 Gastro-esophageal reflux disease without esophagitis: Secondary | ICD-10-CM | POA: Insufficient documentation

## 2024-02-01 DIAGNOSIS — Z01810 Encounter for preprocedural cardiovascular examination: Secondary | ICD-10-CM | POA: Diagnosis present

## 2024-02-01 LAB — BASIC METABOLIC PANEL WITH GFR
Anion gap: 9 (ref 5–15)
BUN: 15 mg/dL (ref 8–23)
CO2: 22 mmol/L (ref 22–32)
Calcium: 9.5 mg/dL (ref 8.9–10.3)
Chloride: 108 mmol/L (ref 98–111)
Creatinine, Ser: 0.99 mg/dL (ref 0.44–1.00)
GFR, Estimated: >60 mL/min (ref >60)
Glucose, Bld: 188 mg/dL — ABNORMAL HIGH (ref 70–99)
Potassium: 3.5 mmol/L (ref 3.5–5.1)
Sodium: 139 mmol/L (ref 135–145)

## 2024-02-01 LAB — GLUCOSE, CAPILLARY: Glucose-Capillary: 190 mg/dL — ABNORMAL HIGH (ref 70–99)

## 2024-02-01 LAB — SURGICAL PCR SCREEN
MRSA, PCR: NEGATIVE
Staphylococcus aureus: POSITIVE — AB

## 2024-02-01 LAB — CBC
HCT: 39.2 % (ref 36.0–46.0)
Hemoglobin: 12.4 g/dL (ref 12.0–15.0)
MCH: 30.5 pg (ref 26.0–34.0)
MCHC: 31.6 g/dL (ref 30.0–36.0)
MCV: 96.6 fL (ref 80.0–100.0)
Platelets: 152 10*3/uL (ref 150–400)
RBC: 4.06 MIL/uL (ref 3.87–5.11)
RDW: 12.6 % (ref 11.5–15.5)
WBC: 4.7 10*3/uL (ref 4.0–10.5)
nRBC: 0 % (ref 0.0–0.2)

## 2024-02-01 LAB — HEMOGLOBIN A1C
Hgb A1c MFr Bld: 6.6 % — ABNORMAL HIGH (ref 4.8–5.6)
Mean Plasma Glucose: 142.72 mg/dL

## 2024-02-01 NOTE — Telephone Encounter (Signed)
 Left message

## 2024-02-05 ENCOUNTER — Encounter (HOSPITAL_COMMUNITY): Payer: Self-pay

## 2024-02-05 LAB — SURGICAL PATHOLOGY

## 2024-02-05 NOTE — Anesthesia Preprocedure Evaluation (Signed)
 Anesthesia Evaluation    Airway        Dental   Pulmonary           Cardiovascular hypertension,      Neuro/Psych    GI/Hepatic   Endo/Other  diabetes    Renal/GU      Musculoskeletal   Abdominal   Peds  Hematology   Anesthesia Other Findings   Reproductive/Obstetrics                              Anesthesia Physical Anesthesia Plan  ASA:   Anesthesia Plan:    Post-op Pain Management:    Induction:   PONV Risk Score and Plan:   Airway Management Planned:   Additional Equipment:   Intra-op Plan:   Post-operative Plan:   Informed Consent:   Plan Discussed with:   Anesthesia Plan Comments: (See PAT note from 5/23)         Anesthesia Quick Evaluation

## 2024-02-05 NOTE — Progress Notes (Signed)
 Case: [id number] Date/Time: 02/11/24 0910   Procedure: ARTHROPLASTY, KNEE, TOTAL (Right: Knee)   Anesthesia type: Choice   Pre-op diagnosis: Right Knee Osteoarthritis   Location: [facility]OM 09 / [facility]   Surgeons: Liliane Rei, MD       DISCUSSION: Envy Meno is a 63 year old female who presents to PAT prior to surgery above.  Past medical history of HTN, HFpEF, CAD (nonobstructive by CTA), OSA (cannot tolerate CPAP), history of ?CVA, ?history of seizures, GERD, IDDM, arthritis, anxiety, depression  Patient follows with cardiology for history of diastolic heart failure and nonobstructive CAD.  Last seen in clinic on 10/11/2023.  She was doing well from a cardiac standpoint at last office visit.  Cardiac clearance received via telephone encounter on 3/7:  "Chart reviewed as part of pre-operative protocol coverage. Given past medical history and time since last visit, based on ACC/AHA guidelines, Raedyn A Schrodt is at acceptable risk for the planned procedure without further cardiovascular testing."  Patient has history of remote CVA and seizures.  Last seen by neurology in 2021 due to arm tremors.  This was attributed to her anxiety with occasional panic attacks.  She takes Xanax .  MRI brain in 2020 was normal.  LD Ozempic:01/20/2024  LD Jardiance: 02/07/24   VS: BP (!) 143/52   Pulse 70   Temp 37.1 C (Oral)   Resp 16   Ht 5' (1.524 m)   Wt 74.8 kg   SpO2 97%   BMI 32.22 kg/m   PROVIDERS: Orlena Bitters, MD   LABS: Labs reviewed: Acceptable for surgery. (all labs ordered are listed, but only abnormal results are displayed)  Labs Reviewed  SURGICAL PCR SCREEN - Abnormal; Notable for the following components:      Result Value   Staphylococcus aureus POSITIVE (*)    All other components within normal limits  HEMOGLOBIN A1C - Abnormal; Notable for the following components:   Hgb A1c MFr Bld 6.6 (*)    All other components within normal limits  BASIC METABOLIC PANEL WITH GFR -  Abnormal; Notable for the following components:   Glucose, Bld 188 (*)    All other components within normal limits  GLUCOSE, CAPILLARY - Abnormal; Notable for the following components:   Glucose-Capillary 190 (*)    All other components within normal limits  CBC     IMAGES:   EKG 02/01/2024 Normal sinus rhythm ST & T wave abnormality, consider lateral ischemia Abnormal ECG When compared with ECG of 19-Jul-2023 13:20, No significant change was found   CV: CTA coronary 01/09/2023:  IMPRESSION: 1. Coronary calcium  score of 15. This was 67th percentile for age and sex matched control.   2. Total plaque volume 66mm3 which is 36th percentile for age and sex-matched controls (calcified plaque 34mm3; noncalcified plaque 23mm3). TPV is mild   3. Normal coronary origin with right dominance.   4. Noncalcified plaque in proximal LCX causes moderate (50-69%) stenosis.   5. Noncalcified plaque in proximal RCA causes mild (25-49%) stenosis   6. Calcified plaque in proximal and mid LAD causes minimal (0-24%) stenosis   7. Will send study for CTFFR   CAD-RADS 3. Moderate stenosis. Consider symptom-guided anti-ischemic pharmacotherapy as well as risk factor modification per guideline directed care. Additional analysis with CT FFR will be submitted.   IMPRESSION: 1.  CTFFR suggests nonobstructive CAD   Echocardiogram 01/26/2022: Left ventricle cavity is normal in size. Concentric hypertrophy of the left ventricle. Normal global wall motion. Normal LV systolic function  with EF 56%. Doppler evidence of grade I (impaired) diastolic dysfunction, normal LAP. No significant valvular abnormality. IVC not seen.  Lexiscan  Tetrofosmin  stress test 01/25/2022: Lexiscan  nuclear stress test performed using 1-day protocol. After injection the patient developed chest pain level 8 of 10, and experienced dyspnea and chest discomfort.  Normal myocardial perfusion. No ischemia to correlate with  reported chest pain. Stress LVEF 69%. Low risk study.    Past Medical History:  Diagnosis Date   Abnormal EKG    Allergy    Anxiety    PTSD   Arthritis    CHF (congestive heart failure) (HCC)    Depression    Diabetes mellitus without complication (HCC)    Dyspnea    GERD (gastroesophageal reflux disease)    History of kidney stones    Hx of absence seizures    last seizure- 1978   Hyperlipidemia    Hypertension    Unspecified   IBS (irritable bowel syndrome)    Panic attacks    severe panic attacks followed by DR Tilda Fogo when happens cannot speak   Panic disorder 03/20/2019   Pneumonia    Seizures (HCC)    Reports last seizure was 30 + years ago    Sleep apnea    no CPAP worn   Stroke (HCC)    2004  slight right eye droop    Past Surgical History:  Procedure Laterality Date   HYSTEROSCOPY  2001   submucous myomectomy   TOTAL ABDOMINAL HYSTERECTOMY  2002   TAH BSO  pathology showed leiomyoma, adenomyosis, simple and complex hyperplasia without atypia   TOTAL KNEE ARTHROPLASTY Left 02/19/2023   Procedure: TOTAL KNEE ARTHROPLASTY;  Surgeon: Liliane Rei, MD;  Location: [facility];  Service: Orthopedics;  Laterality: Left;   TUMOR REMOVAL     from left ear    MEDICATIONS:  acetaminophen  (TYLENOL ) 500 MG tablet   ALPRAZolam  (XANAX ) 1 MG tablet   aspirin  EC 81 MG tablet   atorvastatin  (LIPITOR) 80 MG tablet   buPROPion  (WELLBUTRIN  XL) 150 MG 24 hr tablet   Choline Fenofibrate (FENOFIBRIC ACID) 135 MG CPDR   citalopram  (CELEXA ) 40 MG tablet   ezetimibe  (ZETIA ) 10 MG tablet   furosemide  (LASIX ) 20 MG tablet   gabapentin  (NEURONTIN ) 400 MG capsule   glimepiride  (AMARYL ) 4 MG tablet   ibuprofen (ADVIL) 200 MG tablet   JARDIANCE 10 MG TABS tablet   LANTUS  SOLOSTAR 100 UNIT/ML Solostar Pen   metoprolol  succinate (TOPROL -XL) 25 MG 24 hr tablet   omeprazole (PRILOSEC) 20 MG capsule   OZEMPIC, 2 MG/DOSE, 8 MG/3ML SOPN   potassium chloride  SA (KLOR-CON ) 20 MEQ tablet    spironolactone  (ALDACTONE ) 50 MG tablet   verapamil  (CALAN -SR) 120 MG CR tablet   No current facility-administered medications for this encounter.    Antoinette Kirschner [facility] Short Stay/Anesthesiology [facility] Phone [phone] 02/05/2024 10:20 AM

## 2024-02-07 ENCOUNTER — Ambulatory Visit: Payer: Self-pay | Admitting: Internal Medicine

## 2024-02-08 ENCOUNTER — Telehealth: Payer: Self-pay | Admitting: Cardiology

## 2024-02-08 ENCOUNTER — Ambulatory Visit: Attending: Emergency Medicine | Admitting: Emergency Medicine

## 2024-02-08 ENCOUNTER — Encounter: Payer: Self-pay | Admitting: Emergency Medicine

## 2024-02-08 ENCOUNTER — Telehealth: Payer: Self-pay | Admitting: *Deleted

## 2024-02-08 ENCOUNTER — Ambulatory Visit

## 2024-02-08 VITALS — BP 134/74 | HR 68 | Ht 60.0 in | Wt <= 1120 oz

## 2024-02-08 DIAGNOSIS — R079 Chest pain, unspecified: Secondary | ICD-10-CM

## 2024-02-08 DIAGNOSIS — I5032 Chronic diastolic (congestive) heart failure: Secondary | ICD-10-CM

## 2024-02-08 DIAGNOSIS — F410 Panic disorder [episodic paroxysmal anxiety] without agoraphobia: Secondary | ICD-10-CM | POA: Diagnosis not present

## 2024-02-08 DIAGNOSIS — I2510 Atherosclerotic heart disease of native coronary artery without angina pectoris: Secondary | ICD-10-CM | POA: Diagnosis not present

## 2024-02-08 DIAGNOSIS — I10 Essential (primary) hypertension: Secondary | ICD-10-CM | POA: Diagnosis not present

## 2024-02-08 DIAGNOSIS — E785 Hyperlipidemia, unspecified: Secondary | ICD-10-CM

## 2024-02-08 DIAGNOSIS — G4733 Obstructive sleep apnea (adult) (pediatric): Secondary | ICD-10-CM

## 2024-02-08 NOTE — Telephone Encounter (Signed)
 Pt c/o of Chest Pain: STAT if active (IN THIS MOMENT) CP, including tightness, pressure, jaw pain, shoulder/upper arm/back pain, SOB, nausea, and vomiting.  1. Are you having CP right now (tightness, pressure, or discomfort)? Tightness and pressure   2. Are you experiencing any other symptoms (ex. SOB, nausea, vomiting, sweating)? Pain on left side   3. How long have you been experiencing CP? Couple days   4. Is your CP continuous or coming and going? Continuous   5. Have you taken Nitroglycerin ? no  6. If CP returns before callback, please consider calling 911. ?

## 2024-02-08 NOTE — Telephone Encounter (Addendum)
 Called patient back about message. Patient stated last night she was sitting in the recliner with her feet up watching TV  when her right jaw cramped up with pain that radiated to left chest. Patient stated she couldn't breath with out it hurting, and she felt chest pressure. Patient stated last week her left arm went numb and her left hand cramped up, after walking from parking lot to hospital at [facility]. Consulted DOD, Dr. [place], he recommend patient to get evaluated for chest pain. He stated patient should  not be having surgery without getting chest pain evaluated. Will keep a look out for any openings.    Called patient again to get her in with one of our providers that had an opening come open. Patient agreed to see APP today at 11:20 am.

## 2024-02-08 NOTE — Progress Notes (Signed)
 Patient phoned this morning stating that on 02-07-24 she was watching television and developed left jaw pain and chest pain that lasted for five minutes.  States that has not had these symptoms today.  Patient has notified cardiology and is waiting for a return call.  Patient was advised to go to the ER if symptoms occurred again, patient stated understanding.

## 2024-02-08 NOTE — Telephone Encounter (Signed)
   Pre-operative Risk Assessment    Patient Name: [name] [name]  DOB: Aug 21, 1961 MRN: [id number]   Date of last office visit: 02/08/24 MADISON FOUNTAIN, NP Date of next office visit: NONE   Request for Surgical Clearance    Procedure:  RIGHT TOTAL KNEE ARTHROPLASTY  Date of Surgery:  Clearance 03/24/24                                Surgeon:  DR. Liliane Rei Surgeon's Group or Practice Name:  [facility] Phone number:  [phone] Leron Rankins Fax number:  [phone]   Type of Clearance Requested:   - Medical  - Pharmacy:  Hold Aspirin      Type of Anesthesia:  CHOICE   Additional requests/questions:    Princeton Broom   02/08/2024, 2:34 PM

## 2024-02-08 NOTE — Progress Notes (Addendum)
 Cardiology Office Note:    Date:  02/08/2024  ID:  [name], DOB 02-16-1961, MRN [id number] PCP: [name] Bitters, MD  [facility] Providers Cardiologist:  [name] Angel, MD       Patient Profile:       Chief Complaint: Acute visit for chest pain History of Present Illness:  [name] [name] is a 63 y.o. female with visit-pertinent history of chronic diastolic heart failure, hyperlipidemia, hypertension, history of noncardiac chest pain with CTA revealing nonobstructive disease in 12/2022, anxiety/PTSD, depression, GERD, sleep apnea.  Prior to be followed by Dr. Audery Blazing patient was followed by Dr. Berry Bristol.  Previously underwent coronary catheterization 2005 that showed normal coronary arteries.  Later she underwent a nuclear stress test on 02/2017 that was normal, low risk study with EF estimated 85%.  Echocardiogram in 09/2018 showed LVEF 60 to 65%, mild LVH, no regional wall motion abnormalities, grade 1 DD.  She will need carotid ultrasounds on 01/16/2019 that showed 1-39% stenosis in the bilateral ICAs.  Patient again underwent nuclear stress testing on 01/25/2022 that was normal, low risk study.  Patient did experience chest pain after injection of Lexiscan , however there were no ischemia to correlate with reported chest pain.  Patient underwent echocardiogram 01/2022 that showed normal LV function with LVEF 56%, grade 1 DD, no significant valvular abnormalities.  She underwent coronary CTA on 02/08/2023 that showed coronary calcium  score of 15 (67th percentile), none calcified plaque in proximal left circumflex with moderate stenosis, noncalcified plaques in proximal RCA with mild stenosis, calcified plaque in proximal and mid LAD causing minimal stenosis. Study was sent for [facility] which showed nonobstructive CAD.   Patient was last seen in office on 10/11/2023 by [name] [name], [place].  She was doing well at that time without recurrent chest pain.  Medication management was continued and she was to  follow-up in 6 months.   Discussed the use of AI scribe software for clinical note transcription with the patient, who gave verbal consent to proceed.  History of Present Illness [name] [name] is a 63 year old female with coronary artery disease and diastolic heart failure who presents with chest pain.  She experienced an episode of chest pain, described as a tight, cramping sensation starting in the right jaw and radiating down to the chest and under the breasts, accompanied by dyspnea.  This episode lasted about five minutes and occurred at rest, when sitting in a recliner watching TV, possibly triggered by stress.  Patient notes this possibly could have been a panic attack.  She notes these are very similar symptoms to her prior chest pains in the past that led to stress test and coronary CT.  She reports a recent episode of arm numbness and cramping, similar to a past experience when diagnosed with congestive heart failure.  She had an EKG done that day that was normal.  She has canceled a scheduled knee surgery due to these symptoms.  She has a history of anxiety and severe panic attacks, evaluated by a neurologist who confirmed the absence of strokes. She does not use her CPAP machine due to discomfort with the mask. Her blood pressure readings at home have been good.  She monitors her weight daily due to her heart failure, with no recent weight gain reported.  Review of systems:  Please see the history of present illness. All other systems are reviewed and otherwise negative.      Studies Reviewed:    EKG Interpretation Date/Time:  Friday Feb 08 2024 11:45:40 EDT Ventricular Rate:  68 PR Interval:  168 QRS Duration:  88 QT Interval:  462 QTC Calculation: 491 R Axis:   -2  Text Interpretation: Normal sinus rhythm Minimal voltage criteria for LVH, may be normal variant ( Cornell product ) T wave abnormality, consider lateral ischemia When compared with ECG of 01-Feb-2024 13:26, No  significant change was found Confirmed by Palmer Bobo [phone]) on 02/08/2024 12:12:07 PM    Coronary CTA 01/09/2023 1. Coronary calcium  score of 15. This was 67th percentile for age and sex matched control.   2. Total plaque volume 16mm3 which is 36th percentile for age and sex-matched controls (calcified plaque 79mm3; noncalcified plaque 4mm3). TPV is mild   3. Normal coronary origin with right dominance.   4. Noncalcified plaque in proximal LCX causes moderate (50-69%) stenosis.   5. Noncalcified plaque in proximal RCA causes mild (25-49%) stenosis   6. Calcified plaque in proximal and mid LAD causes minimal (0-24%) stenosis   7. Will send study for CTFFR  IMPRESSION: 1.  CTFFR suggests nonobstructive CAD  Echocardiogram 01/26/2022 Left ventricle cavity is normal in size. Concentric hypertrophy of the  left ventricle. Normal global wall motion. Normal LV systolic function  with EF 56%. Doppler evidence of grade I (impaired) diastolic dysfunction,  normal LAP.  No significant valvular abnormality.  IVC not seen.   Lexiscan  Myoview  01/25/2022 Lexiscan  nuclear stress test performed using 1-day protocol. After injection the patient developed chest pain level 8 of 10, and experienced dyspnea and chest discomfort.  Normal myocardial perfusion. No ischemia to correlate with reported chest pain. Stress LVEF 69%. Low risk study.  Risk Assessment/Calculations:           Physical Exam:   VS:  BP 134/74 (BP Location: Right Arm, Patient Position: Sitting, Cuff Size: Normal)   Pulse 68   Ht 5' (1.524 m)   Wt 11 lb (4.99 kg)   SpO2 94%   BMI 2.15 kg/m    Wt Readings from Last 3 Encounters:  02/08/24 11 lb (4.99 kg)  02/01/24 165 lb (74.8 kg)  01/31/24 166 lb 3.2 oz (75.4 kg)    GEN: Well nourished, well developed in no acute distress NECK: No JVD; No carotid bruits CARDIAC: RRR, no murmurs, rubs, gallops RESPIRATORY:  Clear to auscultation without rales, wheezing or  rhonchi  ABDOMEN: Soft, non-tender, non-distended EXTREMITIES:  No edema; No acute deformity      Assessment and Plan:  Noncardiac Chest Pain  Nonobstructive CAD  Patient has a long history of noncardiac chest pain. Cath in 2005 showed normal coronary arteries. Stress tests in 2018, 2023 were normal. Most recently, coronary CTA in 12/2022 that showed a coronary calcium  score of 15 (67th percentile). There was moderate stenosis in proximal Lcx, mild stenosis in proximal RCA, and minimal plaque in prox-mid LAD. FFR suggested nonobstructive CAD  - Today she reports 1 episode of right sided jaw pain that radiates down and across her chest described as a tight/cramping sensation accompanied by dyspnea.  That lasted 5 minutes while she was at rest watching TV.  She has presented with this similar symptom in the past which has led to reassuring stress testing/coronary CT.  She denies any exertional chest pains or dyspnea. - EKG today unchanged from previous with no acute ischemic changes - Her pain today is atypical for angina.  We reviewed her most recent coronary CTA which was reassuring.  No further testing is indicated at this time.  She is okay to proceed with knee surgery. - Continue atorvastatin  80 mg daily, ezetimibe  10 mg daily, and aspirin  81 mg daily   Chronic Diastolic Heart Failure  Echocardiogram 01/2022 showed EF 56%, grade I DD, no significant valvular abnormalities - Today she is euvolemic and well compensated on exam.  NYHA class I.  Denies DOE, orthopnea, PND, LE edema - Continue lasix  20 mg as needed - Continue jardiance 10 mg daily, spironolactone  50 mg daily    Anxiety  Panic Attacks  Followed by PCP, remains adherent to current medication regimen   Obstructive sleep apnea Is not adherent to CPAP therapy due to discomfort   Hyperlipidemia LDL 60, HDL 21, TG 364 on 09/6107 LDL under excellent control and under goal of less than 70 - Continue atorvastatin  80 mg daily, zetia  10  mg daily    Hypertension Blood pressure today is 134/74 and well-controlled - Continue metoprolol  XL 25 mg daily, spironolactone  50 mg daily, verapamil  120 mg daily   Preoperative cardiovascular exam According to the Revised Cardiac Risk Index (RCRI), her Perioperative Risk of Major Cardiac Event is (%): 6.6. Her Functional Capacity in METs is: 5.62 according to the Duke Activity Status Index (DASI). Therefore, based on ACC/AHA guidelines, patient would be at acceptable risk for the planned procedure without further cardiovascular testing. I will route this recommendation to the requesting party via Epic fax function.   She may hold Aspirin  for 5-7 days prior to procedure. Please resume Aspirin  as soon as possible postprocedure, at the discretion of the surgeon.       Dispo:  Return in about 6 months (around 08/10/2024).  Signed, Ava Boatman, NP

## 2024-02-08 NOTE — Progress Notes (Signed)
 Patient phoned back stating that she is seeing cardiology today at 2 and that they are recommending she cancel surgery.  Spoke to Manor at Dr. Spurgeon Dyer office and advised her of this and she will get in touch with patient.

## 2024-02-08 NOTE — Patient Instructions (Signed)
 Medication Instructions:  NO CHANGES  Lab Work: NONE   Testing/Procedures: NONE  Follow-Up: At [facility], you and your health needs are our priority.  As part of our continuing mission to provide you with exceptional heart care, our providers are all part of one team.  This team includes your primary Cardiologist (physician) and Advanced Practice Providers or APPs (Physician Assistants and Nurse Practitioners) who all work together to provide you with the care you need, when you need it.  Your next appointment:   6 MONTHS  Provider:   Alexandria Angel, MD OR Palmer Bobo, [place]

## 2024-02-11 ENCOUNTER — Encounter (HOSPITAL_COMMUNITY): Admission: RE | Payer: Self-pay | Source: Home / Self Care

## 2024-02-11 ENCOUNTER — Ambulatory Visit (HOSPITAL_COMMUNITY): Admission: RE | Admit: 2024-02-11 | Source: Home / Self Care | Admitting: Orthopedic Surgery

## 2024-02-11 ENCOUNTER — Encounter (HOSPITAL_COMMUNITY): Payer: Self-pay | Admitting: Medical

## 2024-02-11 DIAGNOSIS — M1711 Unilateral primary osteoarthritis, right knee: Secondary | ICD-10-CM

## 2024-02-11 DIAGNOSIS — M1712 Unilateral primary osteoarthritis, left knee: Secondary | ICD-10-CM

## 2024-02-11 SURGERY — ARTHROPLASTY, KNEE, TOTAL
Anesthesia: Choice | Site: Knee | Laterality: Right

## 2024-02-14 DIAGNOSIS — J019 Acute sinusitis, unspecified: Secondary | ICD-10-CM | POA: Diagnosis not present

## 2024-02-14 DIAGNOSIS — R5383 Other fatigue: Secondary | ICD-10-CM | POA: Diagnosis not present

## 2024-02-14 DIAGNOSIS — Z299 Encounter for prophylactic measures, unspecified: Secondary | ICD-10-CM | POA: Diagnosis not present

## 2024-03-04 NOTE — Progress Notes (Signed)
 Sent message, via epic in basket, requesting orders in epic from Careers adviser.

## 2024-03-05 DIAGNOSIS — F330 Major depressive disorder, recurrent, mild: Secondary | ICD-10-CM | POA: Diagnosis not present

## 2024-03-10 NOTE — Progress Notes (Signed)
 COVID Vaccine Completed:  Date of COVID positive in last 90 days:  PCP - Leta Fear, MD Cardiologist - Redell Shallow ,MD  Cardiac clearance by Lum Louis, NP 02/08/24 in Epic  Chest x-ray - n/a EKG - 02/08/24 Epic Stress Test - 01/25/22 Epic ECHO - 01/26/22 Epic Cardiac Cath - 2005 Pacemaker/ICD device last checked: n/a Spinal Cord Stimulator:n/a  Bowel Prep - no  Sleep Study - yes CPAP - no, per pt preference   Fasting Blood Sugar - 120 Checks Blood Sugar 2 times a day  Last dose of GLP1 agonist-  Ozempic, takes Sundays GLP1 instructions:  Do not take after 03/16/24. Last dose 03/16/24   Last dose of SGLT-2 inhibitors-  Jardiance  SGLT-2 instructions:  Do not take after  03/20/24   Blood Thinner Instructions:  Last dose:   Time: Aspirin  Instructions: ASA 81, hold 7 days Last Dose:  Activity level: Can go up a flight of stairs and perform activities of daily living without stopping and without symptoms of chest pain. Endorses SOB with activity- has been checked out. Has to go slow with steps  Anesthesia review: CHF, HTN, DM2, OSA, seizure, stroke  Patient denies shortness of breath, fever, cough and chest pain at PAT appointment  Patient verbalized understanding of instructions that were given to them at the PAT appointment. Patient was also instructed that they will need to review over the PAT instructions again at home before surgery.

## 2024-03-10 NOTE — Patient Instructions (Signed)
 SURGICAL WAITING ROOM VISITATION  Patients having surgery or a procedure may have no more than 2 support people in the waiting area - these visitors may rotate.    Children under the age of 54 must have an adult with them who is not the patient.  Visitors with respiratory illnesses are discouraged from visiting and should remain at home.  If the patient needs to stay at the hospital during part of their recovery, the visitor guidelines for inpatient rooms apply. Pre-op nurse will coordinate an appropriate time for 1 support person to accompany patient in pre-op.  This support person may not rotate.    Please refer to the [facility] website for the visitor guidelines for Inpatients (after your surgery is over and you are in a regular room).    Your procedure is scheduled on: 03/24/24   Report to [facility] Main Entrance    Report to admitting at 10:10 AM   Call this number if you have problems the morning of surgery [phone]   Do not eat food :After Midnight.   After Midnight you may have the following liquids until 9:40 AM DAY OF SURGERY  Water Non-Citrus Juices (without pulp, NO RED-Apple, White grape, White cranberry) Black Coffee (NO MILK/CREAM OR CREAMERS, sugar ok)  Clear Tea (NO MILK/CREAM OR CREAMERS, sugar ok) regular and decaf                             Plain Jell-O (NO RED)                                           Fruit ices (not with fruit pulp, NO RED)                                     Popsicles (NO RED)                                                               Sports drinks like Gatorade (NO RED)               The day of surgery:  Drink ONE (1) Pre-Surgery G2 at 9:40 AM the morning of surgery. Drink in one sitting. Do not sip.  This drink was given to you during your hospital  pre-op appointment visit. Nothing else to drink after completing the  Pre-Surgery G2.          If you have questions, please contact your surgeon's office.   FOLLOW  BOWEL PREP AND ANY ADDITIONAL PRE OP INSTRUCTIONS YOU RECEIVED FROM YOUR SURGEON'S OFFICE!!!     Oral Hygiene is also important to reduce your risk of infection.                                    Remember - BRUSH YOUR TEETH THE MORNING OF SURGERY WITH YOUR REGULAR TOOTHPASTE  DENTURES WILL BE REMOVED PRIOR TO SURGERY PLEASE DO NOT APPLY Poly grip OR ADHESIVES!!!   Stop  all vitamins and herbal supplements 7 days before surgery.   Take these medicines the morning of surgery with A SIP OF WATER: Tylenol , Alprazolam , Atorvastatin , Bupropion , Citalopram ,  Zetia , Gabapentin , Metoprolol   DO NOT TAKE ANY ORAL DIABETIC MEDICATIONS DAY OF YOUR SURGERY  How to Manage Your Diabetes Before and After Surgery  Why is it important to control my blood sugar before and after surgery? Improving blood sugar levels before and after surgery helps healing and can limit problems. A way of improving blood sugar control is eating a healthy diet by:  Eating less sugar and carbohydrates  Increasing activity/exercise  Talking with your doctor about reaching your blood sugar goals High blood sugars (greater than 180 mg/dL) can raise your risk of infections and slow your recovery, so you will need to focus on controlling your diabetes during the weeks before surgery. Make sure that the doctor who takes care of your diabetes knows about your planned surgery including the date and location.  How do I manage my blood sugar before surgery? Check your blood sugar at least 4 times a day, starting 2 days before surgery, to make sure that the level is not too high or low. Check your blood sugar the morning of your surgery when you wake up and every 2 hours until you get to the Short Stay unit. If your blood sugar is less than 70 mg/dL, you will need to treat for low blood sugar: Do not take insulin . Treat a low blood sugar (less than 70 mg/dL) with  cup of clear juice (cranberry or apple), 4 glucose tablets, OR glucose  gel. Recheck blood sugar in 15 minutes after treatment (to make sure it is greater than 70 mg/dL). If your blood sugar is not greater than 70 mg/dL on recheck, call [phone] for further instructions. Report your blood sugar to the short stay nurse when you get to Short Stay.  If you are admitted to the hospital after surgery: Your blood sugar will be checked by the staff and you will probably be given insulin  after surgery (instead of oral diabetes medicines) to make sure you have good blood sugar levels. The goal for blood sugar control after surgery is 80-180 mg/dL.   WHAT DO I DO ABOUT MY DIABETES MEDICATION?  Do not take oral diabetes medicines (pills) the morning of surgery.  Hold Jardiance for 3 days prior to surgery. Last dose 03/20/24.  Do not take Ozempic after 03/16/24. Last dose 03/16/24.  THE DAY BEFORE SURGERY, take Glimepriride in the morning as prescribed, no afternoon or evening dose. Take 50% of Lantus  (36 units).  THE MORNING OF SURGERY, Do not take Glimepiride  or Lantus .  DO NOT TAKE THE FOLLOWING 7 DAYS PRIOR TO SURGERY: Ozempic, Wegovy, Rybelsus (Semaglutide), Byetta (exenatide), Bydureon (exenatide ER), Victoza, Saxenda (liraglutide), or Trulicity (dulaglutide) Mounjaro (Tirzepatide) Adlyxin (Lixisenatide), Polyethylene Glycol Loxenatide.  Reviewed and Endorsed by [facility] Patient Education Committee, August 2015                              You may not have any metal on your body including hair pins, jewelry, and body piercing             Do not wear make-up, lotions, powders, perfumes, or deodorant  Do not wear nail polish including gel and S&S, artificial/acrylic nails, or any other type of covering on natural nails including finger and toenails. If you have artificial nails, gel coating,  etc. that needs to be removed by a nail salon please have this removed prior to surgery or surgery may need to be canceled/ delayed if the surgeon/ anesthesia feels like they  are unable to be safely monitored.   Do not shave  48 hours prior to surgery.    Do not bring valuables to the hospital. [facility] IS NOT             RESPONSIBLE   FOR VALUABLES.   Contacts, glasses, dentures or bridgework may not be worn into surgery.   Bring small overnight bag day of surgery.   DO NOT BRING YOUR HOME MEDICATIONS TO THE HOSPITAL. PHARMACY WILL DISPENSE MEDICATIONS LISTED ON YOUR MEDICATION LIST TO YOU DURING YOUR ADMISSION IN THE HOSPITAL!              Please read over the following fact sheets you were given: IF YOU HAVE QUESTIONS ABOUT YOUR PRE-OP INSTRUCTIONS PLEASE CALL [phone]   If you received a COVID test during your pre-op visit  it is requested that you wear a mask when out in public, stay away from anyone that may not be feeling well and notify your surgeon if you develop symptoms. If you test positive for Covid or have been in contact with anyone that has tested positive in the last 10 days please notify you surgeon.      Pre-operative 5 CHG Bath Instructions   You can play a key role in reducing the risk of infection after surgery. Your skin needs to be as free of germs as possible. You can reduce the number of germs on your skin by washing with CHG (chlorhexidine  gluconate) soap before surgery. CHG is an antiseptic soap that kills germs and continues to kill germs even after washing.   DO NOT use if you have an allergy to chlorhexidine /CHG or antibacterial soaps. If your skin becomes reddened or irritated, stop using the CHG and notify one of our RNs at [phone].   Please shower with the CHG soap starting 4 days before surgery using the following schedule:     Please keep in mind the following:  DO NOT shave, including legs and underarms, starting the day of your first shower.   You may shave your face at any point before/day of surgery.  Place clean sheets on your bed the day you start using CHG soap. Use a clean washcloth (not  used since being washed) for each shower. DO NOT sleep with pets once you start using the CHG.   CHG Shower Instructions:  If you choose to wash your hair and private area, wash first with your normal shampoo/soap.  After you use shampoo/soap, rinse your hair and body thoroughly to remove shampoo/soap residue.  Turn the water OFF and apply about 3 tablespoons (45 ml) of CHG soap to a CLEAN washcloth.  Apply CHG soap ONLY FROM YOUR NECK DOWN TO YOUR TOES (washing for 3-5 minutes)  DO NOT use CHG soap on face, private areas, open wounds, or sores.  Pay special attention to the area where your surgery is being performed.  If you are having back surgery, having someone wash your back for you may be helpful. Wait 2 minutes after CHG soap is applied, then you may rinse off the CHG soap.  Pat dry with a clean towel  Put on clean clothes/pajamas   If you choose to wear lotion, please use ONLY the CHG-compatible lotions on the back of this paper.  Additional instructions for the day of surgery: DO NOT APPLY any lotions, deodorants, cologne, or perfumes.   Put on clean/comfortable clothes.  Brush your teeth.  Ask your nurse before applying any prescription medications to the skin.      CHG Compatible Lotions   Aveeno Moisturizing lotion  Cetaphil Moisturizing Cream  Cetaphil Moisturizing Lotion  Clairol Herbal Essence Moisturizing Lotion, Dry Skin  Clairol Herbal Essence Moisturizing Lotion, Extra Dry Skin  Clairol Herbal Essence Moisturizing Lotion, Normal Skin  Curel Age Defying Therapeutic Moisturizing Lotion with Alpha Hydroxy  Curel Extreme Care Body Lotion  Curel Soothing Hands Moisturizing Hand Lotion  Curel Therapeutic Moisturizing Cream, Fragrance-Free  Curel Therapeutic Moisturizing Lotion, Fragrance-Free  Curel Therapeutic Moisturizing Lotion, Original Formula  Eucerin Daily Replenishing Lotion  Eucerin Dry Skin Therapy Plus Alpha Hydroxy Crme  Eucerin Dry Skin Therapy  Plus Alpha Hydroxy Lotion  Eucerin Original Crme  Eucerin Original Lotion  Eucerin Plus Crme Eucerin Plus Lotion  Eucerin TriLipid Replenishing Lotion  Keri Anti-Bacterial Hand Lotion  Keri Deep Conditioning Original Lotion Dry Skin Formula Softly Scented  Keri Deep Conditioning Original Lotion, Fragrance Free Sensitive Skin Formula  Keri Lotion Fast Absorbing Fragrance Free Sensitive Skin Formula  Keri Lotion Fast Absorbing Softly Scented Dry Skin Formula  Keri Original Lotion  Keri Skin Renewal Lotion Keri Silky Smooth Lotion  Keri Silky Smooth Sensitive Skin Lotion  Nivea Body Creamy Conditioning Oil  Nivea Body Extra Enriched Lotion  Nivea Body Original Lotion  Nivea Body Sheer Moisturizing Lotion Nivea Crme  Nivea Skin Firming Lotion  NutraDerm 30 Skin Lotion  NutraDerm Skin Lotion  NutraDerm Therapeutic Skin Cream  NutraDerm Therapeutic Skin Lotion  ProShield Protective Hand Cream  Provon moisturizing lotion   Incentive Spirometer  An incentive spirometer is a tool that can help keep your lungs clear and active. This tool measures how well you are filling your lungs with each breath. Taking long deep breaths may help reverse or decrease the chance of developing breathing (pulmonary) problems (especially infection) following: A long period of time when you are unable to move or be active. BEFORE THE PROCEDURE  If the spirometer includes an indicator to show your best effort, your nurse or respiratory therapist will set it to a desired goal. If possible, sit up straight or lean slightly forward. Try not to slouch. Hold the incentive spirometer in an upright position. INSTRUCTIONS FOR USE  Sit on the edge of your bed if possible, or sit up as far as you can in bed or on a chair. Hold the incentive spirometer in an upright position. Breathe out normally. Place the mouthpiece in your mouth and seal your lips tightly around it. Breathe in slowly and as deeply as possible,  raising the piston or the ball toward the top of the column. Hold your breath for 3-5 seconds or for as long as possible. Allow the piston or ball to fall to the bottom of the column. Remove the mouthpiece from your mouth and breathe out normally. Rest for a few seconds and repeat Steps 1 through 7 at least 10 times every 1-2 hours when you are awake. Take your time and take a few normal breaths between deep breaths. The spirometer may include an indicator to show your best effort. Use the indicator as a goal to work toward during each repetition. After each set of 10 deep breaths, practice coughing to be sure your lungs are clear. If you have an incision (the cut made at the time  of surgery), support your incision when coughing by placing a pillow or rolled up towels firmly against it. Once you are able to get out of bed, walk around indoors and cough well. You may stop using the incentive spirometer when instructed by your caregiver.  RISKS AND COMPLICATIONS Take your time so you do not get dizzy or light-headed. If you are in pain, you may need to take or ask for pain medication before doing incentive spirometry. It is harder to take a deep breath if you are having pain. AFTER USE Rest and breathe slowly and easily. It can be helpful to keep track of a log of your progress. Your caregiver can provide you with a simple table to help with this. If you are using the spirometer at home, follow these instructions: SEEK MEDICAL CARE IF:  You are having difficultly using the spirometer. You have trouble using the spirometer as often as instructed. Your pain medication is not giving enough relief while using the spirometer. You develop fever of 100.5 F (38.1 C) or higher. SEEK IMMEDIATE MEDICAL CARE IF:  You cough up bloody sputum that had not been present before. You develop fever of 102 F (38.9 C) or greater. You develop worsening pain at or near the incision site. MAKE SURE YOU:  Understand  these instructions. Will watch your condition. Will get help right away if you are not doing well or get worse. Document Released: 01/08/2007 Document Revised: 11/20/2011 Document Reviewed: 03/11/2007 [facility] Patient Information 2014 [place], [place].   ________________________________________________________________________

## 2024-03-11 ENCOUNTER — Other Ambulatory Visit: Payer: Self-pay

## 2024-03-11 ENCOUNTER — Encounter (HOSPITAL_COMMUNITY): Payer: Self-pay

## 2024-03-11 ENCOUNTER — Encounter (HOSPITAL_COMMUNITY)
Admission: RE | Admit: 2024-03-11 | Discharge: 2024-03-11 | Disposition: A | Discharge status: Home / Self Care | Source: Ambulatory Visit | Attending: Orthopedic Surgery | Admitting: Orthopedic Surgery

## 2024-03-11 VITALS — BP 139/65 | HR 66 | Temp 98.1°F | Resp 16 | Ht 60.0 in | Wt 163.0 lb

## 2024-03-11 DIAGNOSIS — Z7982 Long term (current) use of aspirin: Secondary | ICD-10-CM | POA: Insufficient documentation

## 2024-03-11 DIAGNOSIS — Z01812 Encounter for preprocedural laboratory examination: Secondary | ICD-10-CM | POA: Insufficient documentation

## 2024-03-11 DIAGNOSIS — I509 Heart failure, unspecified: Secondary | ICD-10-CM | POA: Diagnosis not present

## 2024-03-11 DIAGNOSIS — I110 Hypertensive heart disease with heart failure: Secondary | ICD-10-CM | POA: Diagnosis not present

## 2024-03-11 DIAGNOSIS — Z01818 Encounter for other preprocedural examination: Secondary | ICD-10-CM | POA: Diagnosis present

## 2024-03-11 DIAGNOSIS — G4730 Sleep apnea, unspecified: Secondary | ICD-10-CM | POA: Diagnosis not present

## 2024-03-11 DIAGNOSIS — M1711 Unilateral primary osteoarthritis, right knee: Secondary | ICD-10-CM | POA: Diagnosis not present

## 2024-03-11 DIAGNOSIS — E1140 Type 2 diabetes mellitus with diabetic neuropathy, unspecified: Secondary | ICD-10-CM | POA: Insufficient documentation

## 2024-03-11 HISTORY — DX: Fatty (change of) liver, not elsewhere classified: K76.0

## 2024-03-11 LAB — SURGICAL PCR SCREEN
MRSA, PCR: NEGATIVE
Staphylococcus aureus: POSITIVE — AB

## 2024-03-11 LAB — CBC
HCT: 39.7 % (ref 36.0–46.0)
Hemoglobin: 12.6 g/dL (ref 12.0–15.0)
MCH: 30.3 pg (ref 26.0–34.0)
MCHC: 31.7 g/dL (ref 30.0–36.0)
MCV: 95.4 fL (ref 80.0–100.0)
Platelets: 199 10*3/uL (ref 150–400)
RBC: 4.16 MIL/uL (ref 3.87–5.11)
RDW: 12.6 % (ref 11.5–15.5)
WBC: 4.7 10*3/uL (ref 4.0–10.5)
nRBC: 0 % (ref 0.0–0.2)

## 2024-03-11 LAB — GLUCOSE, CAPILLARY: Glucose-Capillary: 232 mg/dL — ABNORMAL HIGH (ref 70–99)

## 2024-03-11 LAB — BASIC METABOLIC PANEL WITH GFR
Anion gap: 9 (ref 5–15)
BUN: 19 mg/dL (ref 8–23)
CO2: 23 mmol/L (ref 22–32)
Calcium: 9.3 mg/dL (ref 8.9–10.3)
Chloride: 105 mmol/L (ref 98–111)
Creatinine, Ser: 0.96 mg/dL (ref 0.44–1.00)
GFR, Estimated: >60 mL/min (ref >60)
Glucose, Bld: 224 mg/dL — ABNORMAL HIGH (ref 70–99)
Potassium: 3.8 mmol/L (ref 3.5–5.1)
Sodium: 137 mmol/L (ref 135–145)

## 2024-03-13 NOTE — Anesthesia Preprocedure Evaluation (Addendum)
 Anesthesia Evaluation  Patient identified by MRN, date of birth, ID band Patient awake    Reviewed: Allergy & Precautions, NPO status , Patient's Chart, lab work & pertinent test results  Airway Mallampati: II  TM Distance: >3 FB Neck ROM: Full    Dental no notable dental hx. (+) Dental Advisory Given, Teeth Intact   Pulmonary shortness of breath, sleep apnea , pneumonia   Pulmonary exam normal breath sounds clear to auscultation       Cardiovascular hypertension, Pt. on medications +CHF  Normal cardiovascular exam Rhythm:Regular Rate:Normal  Echocardiogram 01/26/2022:  Left ventricle cavity is normal in size. Concentric hypertrophy of the left ventricle. Normal global wall motion. Normal LV systolic function with EF 56%. Doppler evidence of grade I (impaired) diastolic dysfunction, normal LAP.  No significant valvular abnormality.  IVC not seen.    Lexiscan  Tetrofosmin  stress test 01/25/2022: Lexiscan  nuclear stress test performed using 1-day protocol. After injection the patient developed chest pain level 8 of 10, and experienced dyspnea and chest discomfort.  Normal myocardial perfusion. No ischemia to correlate with reported chest pain. Stress LVEF 69%. Low risk study.       CT Coronary Ca 12/2022 1. Left Main: No significant stenosis   2. LAD: CTFFR gradually tapers, falling to 0.70 across distal stenosis, with change in FFR across lesion suggesting no significant stenosis   3. LCX: CTFFR 0.83 across lesion in proximal LCX, suggesting lesion is not functionally significant   4. RCA: CTFFR 0.93 across lesion in proximal RCA, suggesting lesion is not functionally significant   IMPRESSION: 1.  CTFFR suggests nonobstructive CAD     Echo 2020 - Left ventricle: The cavity size was normal. Wall thickness was increased in a pattern of mild LVH. Systolic function was normal.  The estimated ejection fraction was in the range  of 60% to 65%. Wall motion was normal; there were no regional wall motion abnormalities. Doppler parameters are consistent with abnormal left ventricular relaxation (grade 1 diastolic dysfunction).  - Aortic valve: Mildly calcified annulus. Trileaflet.  - Mitral valve: Mildly calcified annulus. There was trivial \ regurgitation.  - Right atrium: Central venous pressure (est): 3 mm Hg.  - Atrial septum: There was increased thickness of the septum, consistent with lipomatous hypertrophy.  - Tricuspid valve: There was trivial regurgitation.  - Pulmonary arteries: Systolic pressure could not be accurately estimated.  - Pericardium, extracardiac: A prominent pericardial fat pad was present. Cannot exclude small to moderate sized, organized circumferential pericardial effusion. Could consider CT imaging.     Neuro/Psych Seizures -,  PSYCHIATRIC DISORDERS Anxiety Depression    CVA    GI/Hepatic Neg liver ROS,GERD  Medicated and Controlled,,  Endo/Other  diabetes    Renal/GU Renal disease     Musculoskeletal  (+) Arthritis ,    Abdominal  (+) + obese  Peds  Hematology negative hematology ROS (+)   Anesthesia Other Findings   Reproductive/Obstetrics                              Anesthesia Physical Anesthesia Plan  ASA: 3  Anesthesia Plan: Spinal   Post-op Pain Management: Regional block*, Gabapentin  PO (pre-op)* and Ofirmev  IV (intra-op)*   Induction:   PONV Risk Score and Plan: 3 and Ondansetron , Dexamethasone , Treatment may vary due to age or medical condition, Propofol  infusion and TIVA  Airway Management Planned: Natural Airway  Additional Equipment:   Intra-op Plan:   Post-operative Plan:  Informed Consent: I have reviewed the patients History and Physical, chart, labs and discussed the procedure including the risks, benefits and alternatives for the proposed anesthesia with the patient or authorized representative who has indicated his/her  understanding and acceptance.     Dental advisory given  Plan Discussed with: CRNA  Anesthesia Plan Comments: (See PAT note 03/11/2024  Risks of anesthesia explained at length. This includes, but is not limited to, sore throat, damage to teeth, lips gums, tongue and vocal cords, nausea and vomiting, reactions to medications, stroke, heart attack, and death. All patient questions were answered and the patient wishes to proceed. Risks of peripheral nerve block and spinal explained at length. This includes, but is not limited to, bleeding, infection, reactions to the medications, seizures, damage to surrounding structures, damage to nerves, permanent weakness, numbness, tingling and pain. All patient questions were answered and patient wishes to proceed with nerve block and spinal. )         Anesthesia Quick Evaluation

## 2024-03-13 NOTE — Progress Notes (Signed)
 Anesthesia Chart Review   Case: [id number] Date/Time: 03/24/24 1225   Procedure: ARTHROPLASTY, KNEE, TOTAL (Right: Knee)   Anesthesia type: Choice   Pre-op diagnosis: Right total knee arthroplasty   Location: [facility] ROOM 10 / [facility]   Surgeons: Melodi Lerner, MD       DISCUSSION:63 y.o. never smoker with h/o HTN, sleep apnea, DM II (A1C 6.6), CHF, right knee OA scheduled for above procedure 03/24/2024 with Dr. Lerner Melodi.   Pt last seen by cardiology 02/08/24. Per OV note, According to the Revised Cardiac Risk Index (RCRI), her Perioperative Risk of Major Cardiac Event is (%): 6.6. Her Functional Capacity in METs is: 5.62 according to the Duke Activity Status Index (DASI). Therefore, based on ACC/AHA guidelines, patient would be at acceptable risk for the planned procedure without further cardiovascular testing. I will route this recommendation to the requesting party via Epic fax function.    She may hold Aspirin  for 5-7 days prior to procedure. Please resume Aspirin  as soon as possible postprocedure, at the discretion of the surgeon.    VS: BP 139/65   Pulse 66   Temp 36.7 C (Oral)   Resp 16   Ht 5' (1.524 m)   Wt 73.9 kg   SpO2 97%   BMI 31.83 kg/m   PROVIDERS: Rosamond Leta NOVAK, MD is PCP   Cardiologist - Redell Shallow ,MD  LABS: Labs reviewed: Acceptable for surgery. (all labs ordered are listed, but only abnormal results are displayed)  Labs Reviewed  SURGICAL PCR SCREEN - Abnormal; Notable for the following components:      Result Value   Staphylococcus aureus POSITIVE (*)    All other components within normal limits  BASIC METABOLIC PANEL WITH GFR - Abnormal; Notable for the following components:   Glucose, Bld 224 (*)    All other components within normal limits  GLUCOSE, CAPILLARY - Abnormal; Notable for the following components:   Glucose-Capillary 232 (*)    All other components within normal limits  CBC     IMAGES:   EKG:   CV: Echocardiogram  01/26/2022: Left ventricle cavity is normal in size. Concentric hypertrophy of the left ventricle. Normal global wall motion. Normal LV systolic function with EF 56%. Doppler evidence of grade I (impaired) diastolic dysfunction, normal LAP. No significant valvular abnormality. IVC not seen.   Lexiscan  Tetrofosmin  stress test 01/25/2022: Lexiscan  nuclear stress test performed using 1-day protocol. After injection the patient developed chest pain level 8 of 10, and experienced dyspnea and chest discomfort.  Normal myocardial perfusion. No ischemia to correlate with reported chest pain. Stress LVEF 69%. Low risk study.  Past Medical History:  Diagnosis Date   Abnormal EKG    Allergy    Anxiety    PTSD   Arthritis    CHF (congestive heart failure) (HCC)    Depression    Diabetes mellitus without complication (HCC)    Dyspnea    Fatty liver    GERD (gastroesophageal reflux disease)    History of kidney stones    Hx of absence seizures    last seizure- 1978   Hyperlipidemia    Hypertension    Unspecified   IBS (irritable bowel syndrome)    Panic attacks    severe panic attacks followed by DR jenel when happens cannot speak   Panic disorder 03/20/2019   Pneumonia    Seizures (HCC)    Reports last seizure was 30 + years ago    Sleep apnea  no CPAP worn   Stroke [facility])    2004  slight right eye droop    Past Surgical History:  Procedure Laterality Date   HYSTEROSCOPY  2001   submucous myomectomy   TOTAL ABDOMINAL HYSTERECTOMY  2002   TAH BSO  pathology showed leiomyoma, adenomyosis, simple and complex hyperplasia without atypia   TOTAL KNEE ARTHROPLASTY Left 02/19/2023   Procedure: TOTAL KNEE ARTHROPLASTY;  Surgeon: Melodi Lerner, MD;  Location: [facility];  Service: Orthopedics;  Laterality: Left;   TUMOR REMOVAL     from left ear    MEDICATIONS:  acetaminophen  (TYLENOL ) 500 MG tablet   ALPRAZolam  (XANAX ) 1 MG tablet   aspirin  EC 81 MG tablet   atorvastatin  (LIPITOR)  80 MG tablet   buPROPion  (WELLBUTRIN  XL) 150 MG 24 hr tablet   Choline Fenofibrate (FENOFIBRIC ACID) 135 MG CPDR   citalopram  (CELEXA ) 40 MG tablet   ezetimibe  (ZETIA ) 10 MG tablet   furosemide  (LASIX ) 20 MG tablet   gabapentin  (NEURONTIN ) 400 MG capsule   glimepiride  (AMARYL ) 4 MG tablet   JARDIANCE 10 MG TABS tablet   LANTUS  SOLOSTAR 100 UNIT/ML Solostar Pen   metoprolol  succinate (TOPROL -XL) 25 MG 24 hr tablet   omeprazole (PRILOSEC) 20 MG capsule   OZEMPIC, 2 MG/DOSE, 8 MG/3ML SOPN   potassium chloride  SA (KLOR-CON ) 20 MEQ tablet   spironolactone  (ALDACTONE ) 50 MG tablet   verapamil  (CALAN -SR) 120 MG CR tablet   No current facility-administered medications for this encounter.     Harlene Hoots Ward, PA-C [facility]-Surgical Testing [phone]

## 2024-03-21 NOTE — H&P (Signed)
 TOTAL KNEE ADMISSION H&P  Patient is being admitted for right total knee arthroplasty.  Subjective:  Chief Complaint: Right knee pain.  HPI: [name] [name], 63 y.o. female has a history of pain and functional disability in the right knee due to arthritis and has failed non-surgical conservative treatments for greater than 12 weeks to include corticosteriod injections and activity modification. Onset of symptoms was gradual, starting >10 years ago with gradually worsening course since that time. The patient noted no past surgery on the right knee.  Patient currently rates pain in the right knee at 7 out of 10 with activity. Patient has night pain, worsening of pain with activity and weight bearing, pain with passive range of motion, and crepitus. Patient has evidence of bone-on-bone arthritis in the medial and patellofemoral compartments of the right knee by imaging studies. There is no active infection.  Patient Active Problem List   Diagnosis Date Noted   OA (osteoarthritis) of knee 02/19/2023   Primary osteoarthritis of left knee 02/19/2023   AKI (acute kidney injury) (HCC)    Chronic diastolic heart failure (HCC)    Class 1 obesity due to excess calories with body mass index (BMI) of 31.0 to 31.9 in adult    Type 2 diabetes mellitus with diabetic neuropathy, without long-term current use of insulin  (HCC)    Gastroesophageal reflux disease    Hyperkalemia 02/08/2020   Panic disorder 03/20/2019   External hemorrhoids 12/08/2014   Rectal bleeding 11/22/2014   Hemorrhoids, internal 11/22/2014   Essential hypertension 06/08/2009   DYSPNEA 06/08/2009   ABNORMAL EKG 06/08/2009    Past Medical History:  Diagnosis Date   Abnormal EKG    Allergy    Anxiety    PTSD   Arthritis    CHF (congestive heart failure) (HCC)    Depression    Diabetes mellitus without complication (HCC)    Dyspnea    Fatty liver    GERD (gastroesophageal reflux disease)    History of kidney stones    Hx of  absence seizures    last seizure- 1978   Hyperlipidemia    Hypertension    Unspecified   IBS (irritable bowel syndrome)    Panic attacks    severe panic attacks followed by DR jenel when happens cannot speak   Panic disorder 03/20/2019   Pneumonia    Seizures (HCC)    Reports last seizure was 30 + years ago    Sleep apnea    no CPAP worn   Stroke (HCC)    2004  slight right eye droop    Past Surgical History:  Procedure Laterality Date   HYSTEROSCOPY  2001   submucous myomectomy   TOTAL ABDOMINAL HYSTERECTOMY  2002   TAH BSO  pathology showed leiomyoma, adenomyosis, simple and complex hyperplasia without atypia   TOTAL KNEE ARTHROPLASTY Left 02/19/2023   Procedure: TOTAL KNEE ARTHROPLASTY;  Surgeon: Melodi Lerner, MD;  Location: [facility];  Service: Orthopedics;  Laterality: Left;   TUMOR REMOVAL     from left ear    Prior to Admission medications   Medication Sig Start Date End Date Taking? Authorizing Provider  acetaminophen  (TYLENOL ) 500 MG tablet Take 500-1,000 mg by mouth every 6 (six) hours as needed for headache.   Yes [provider]  ALPRAZolam  (XANAX ) 1 MG tablet Take 1 mg by mouth 3 (three) times daily as needed for anxiety.   Yes [provider]  aspirin  EC 81 MG tablet Take 81 mg by mouth  at bedtime. Swallow whole.   Yes [provider]  atorvastatin  (LIPITOR) 80 MG tablet TAKE ONE TABLET BY MOUTH EVERY DAY 01/15/24  Yes Pietro Redell RAMAN, MD  buPROPion  (WELLBUTRIN  XL) 150 MG 24 hr tablet Take 150 mg by mouth every morning. 06/02/20  Yes [provider]  Choline Fenofibrate (FENOFIBRIC ACID) 135 MG CPDR Take 135 mg by mouth at bedtime. 11/19/20  Yes [provider]  citalopram  (CELEXA ) 40 MG tablet Take 40 mg by mouth daily.   Yes [provider]  ezetimibe  (ZETIA ) 10 MG tablet Take 1 tablet (10 mg total) by mouth daily. 06/15/23  Yes Pietro Redell RAMAN, MD  furosemide  (LASIX ) 20 MG tablet Twice daily as needed if  weight >3 Lbs 05/29/19  Yes Ladona Heinz, MD  gabapentin  (NEURONTIN ) 400 MG capsule Take 400 mg by mouth 2 (two) times daily.   Yes [provider]  glimepiride  (AMARYL ) 4 MG tablet Take 4 mg by mouth 2 (two) times daily. 01/14/20  Yes [provider]  JARDIANCE  10 MG TABS tablet Take 10 mg by mouth daily. 12/14/22  Yes [provider]  LANTUS  SOLOSTAR 100 UNIT/ML Solostar Pen Inject 73 Units into the skin at bedtime. 12/27/23  Yes [provider]  metoprolol  succinate (TOPROL -XL) 25 MG 24 hr tablet TAKE 1 TABLET BY MOUTH DAILY.TAKE WITH OR IMMEDIATELY FOLLOWING A MEAL 11/08/23  Yes Vicci Rollo SAUNDERS, PA-C  omeprazole (PRILOSEC) 20 MG capsule Take 20 mg by mouth at bedtime.  07/02/14  Yes [provider]  OZEMPIC, 2 MG/DOSE, 8 MG/3ML SOPN Inject 2 mLs into the skin every Sunday. 12/19/23  Yes [provider]  potassium chloride  SA (KLOR-CON ) 20 MEQ tablet Take 20 mEq by mouth daily as needed (With Furosemide ). 02/11/20  Yes Ladona Heinz, MD  spironolactone  (ALDACTONE ) 50 MG tablet TAKE ONE TABLET BY MOUTH EVERY DAY IN THE MORNING 08/08/23  Yes Pietro Redell RAMAN, MD  verapamil  (CALAN -SR) 120 MG CR tablet Take 120 mg by mouth at bedtime.  07/02/14  Yes [provider]    Allergies  Allergen Reactions   Azithromycin     thrush   Doxycycline Rash   Erythromycin Rash   Olmesartan Dermatitis   Prednisone Other (See Comments)    Sweating    Social History   Socioeconomic History   Marital status: Married    Spouse name: Not on file   Number of children: 0   Years of education: Not on file   Highest education level: Some college, no degree  Occupational History   Occupation: Full Time    Comment: Sales  Tobacco Use   Smoking status: Never   Smokeless tobacco: Never  Vaping Use   Vaping status: Never Used  Substance and Sexual Activity   Alcohol use: No    Alcohol/week: 0.0 standard drinks of alcohol   Drug use: No   Sexual  activity: Not Currently    Comment: 1st intercourse 63 yo-Fewer than 5 partners  Other Topics Concern   Not on file  Social History Narrative   Right handed   Caffeine 1-2 cups daily    Lives at home with husband with 2 dogs    Regular exercise: No   Social Drivers of Corporate investment banker Strain: Not on file  Food Insecurity: Low Risk  (01/07/2024)   Received from [facility]   Hunger Vital Sign    Within the past 12 months, you worried that your food would run out before  you got money to buy more: Never true    Within the past 12 months, the food you bought just didn't last and you didn't have money to get more. : Never true  Transportation Needs: No Transportation Needs (01/07/2024)   Received from [facility]    In the past 12 months, has lack of reliable transportation kept you from medical appointments, meetings, work or from getting things needed for daily living? : No  Physical Activity: Not on file  Stress: Not on file  Social Connections: Not on file  Intimate Partner Violence: Not At Risk (02/19/2023)   Humiliation, Afraid, Rape, and Kick questionnaire    Fear of Current or Ex-Partner: No    Emotionally Abused: No    Physically Abused: No    Sexually Abused: No    Tobacco Use: Low Risk  (02/08/2024)   Patient History    Smoking Tobacco Use: Never    Smokeless Tobacco Use: Never    Passive Exposure: Not on file   Social History   Substance and Sexual Activity  Alcohol Use No   Alcohol/week: 0.0 standard drinks of alcohol    Family History  Problem Relation Age of Onset   Uterine cancer Mother    Irritable bowel syndrome Mother    Stroke Mother    Osteoporosis Mother    Cancer Father        Prostate   High blood pressure Father    Heart attack Brother 62   Cancer Brother        Prostate   Heart disease Maternal Grandmother    Coronary artery disease Paternal Grandfather    Colitis Neg Hx    Esophageal cancer Neg Hx     Stomach cancer Neg Hx    Rectal cancer Neg Hx    Breast cancer Neg Hx    Colon cancer Neg Hx    Colon polyps Neg Hx     Review of Systems  Constitutional:  Negative for chills and fever.  HENT:  Negative for congestion, sore throat and tinnitus.   Eyes:  Negative for double vision, photophobia and pain.  Respiratory:  Negative for cough, shortness of breath and wheezing.   Cardiovascular:  Negative for chest pain, palpitations and orthopnea.  Gastrointestinal:  Negative for heartburn, nausea and vomiting.  Genitourinary:  Negative for dysuria, frequency and urgency.  Musculoskeletal:  Positive for joint pain.  Neurological:  Negative for dizziness, weakness and headaches.    Objective:  Physical Exam: Well nourished and well developed.  General: Alert and oriented x3, cooperative and pleasant, no acute distress.  Head: normocephalic, atraumatic, neck supple.  Eyes: EOMI.  Musculoskeletal:  Her right knee shows no effusion. She has slight varus on the right. Range of motion is 0 to 125 degrees with crepitus on range of motion. She is tender medially. There is slight lateral tenderness. There is no instability noted.  Calves soft and nontender. Motor function intact in LE. Strength 5/5 LE bilaterally. Neuro: Distal pulses 2+. Sensation to light touch intact in LE.   Imaging Review Plain radiographs demonstrate severe degenerative joint disease of the right knee. The overall alignment is neutral. The bone quality appears to be adequate for age and reported activity level.  Assessment/Plan:  End stage arthritis, right knee   The patient history, physical examination, clinical judgment of the provider and imaging studies are consistent with end stage degenerative joint disease of the right knee and total knee arthroplasty is deemed medically  necessary. The treatment options including medical management, injection therapy arthroscopy and arthroplasty were discussed at length. The  risks and benefits of total knee arthroplasty were presented and reviewed. The risks due to aseptic loosening, infection, stiffness, patella tracking problems, thromboembolic complications and other imponderables were discussed. The patient acknowledged the explanation, agreed to proceed with the plan and consent was signed. Patient is being admitted for inpatient treatment for surgery, pain control, PT, OT, prophylactic antibiotics, VTE prophylaxis, progressive ambulation and ADLs and discharge planning. The patient is planning to be discharged home.   Patient's anticipated LOS is less than 2 midnights, meeting these requirements: - Younger than 58 - Lives within 1 hour of care - Has a competent adult at home to recover with post-op recover - NO history of  - Chronic pain requiring opiods  - Diabetes  - Coronary Artery Disease  - Heart failure  - Heart attack  - Stroke  - DVT/VTE  - Cardiac arrhythmia  - Respiratory Failure/COPD  - Renal failure  - Anemia  - Advanced Liver disease  Therapy Plans: Outpatient therapy at [facility] Disposition: Home with husband Planned DVT Prophylaxis:  DME Needed: [facility] PCP: Leta Fear, MD (clearance received) Cardiologist: Redell Shallow, MD (clearance received) TXA: IV Allergies: Prednisone, doxycycline, erythromycin, olmesartan Metal Allergy: None Anesthesia Concerns: None BMI: 32.5 Last HgbA1c: 7.1% (09/2023) - rechecking w/ preop labs Pain Regimen: Dilaudid , cyclobenzaprine  ** [facility]  Other: - Hold glimepiride  day prior to surgery, ozempic 7 days prior, ASA 5 days prior - PMH: DM, CHF  - Patient was instructed on what medications to stop prior to surgery. - Follow-up visit in 2 weeks with Dr. Melodi - Begin physical therapy following surgery - Pre-operative lab work as pre-surgical testing - Prescriptions will be provided in hospital at time of discharge  Roxie Mess, PA-C Orthopedic  Surgery [facility] [place]

## 2024-03-24 ENCOUNTER — Ambulatory Visit (HOSPITAL_COMMUNITY): Payer: Self-pay | Admitting: Physician Assistant

## 2024-03-24 ENCOUNTER — Observation Stay (HOSPITAL_COMMUNITY)
Admission: RE | Admit: 2024-03-24 | Discharge: 2024-03-26 | Disposition: A | Discharge status: Home / Self Care | Source: Ambulatory Visit | Attending: Orthopedic Surgery | Admitting: Orthopedic Surgery

## 2024-03-24 ENCOUNTER — Other Ambulatory Visit: Payer: Self-pay

## 2024-03-24 ENCOUNTER — Ambulatory Visit (HOSPITAL_BASED_OUTPATIENT_CLINIC_OR_DEPARTMENT_OTHER): Admitting: Anesthesiology

## 2024-03-24 ENCOUNTER — Encounter (HOSPITAL_COMMUNITY): Payer: Self-pay | Admitting: Orthopedic Surgery

## 2024-03-24 ENCOUNTER — Encounter (HOSPITAL_COMMUNITY)
Admission: RE | Disposition: A | Discharge status: Home / Self Care | Payer: Self-pay | Source: Ambulatory Visit | Attending: Orthopedic Surgery

## 2024-03-24 DIAGNOSIS — I110 Hypertensive heart disease with heart failure: Secondary | ICD-10-CM | POA: Diagnosis not present

## 2024-03-24 DIAGNOSIS — E1140 Type 2 diabetes mellitus with diabetic neuropathy, unspecified: Secondary | ICD-10-CM

## 2024-03-24 DIAGNOSIS — M1711 Unilateral primary osteoarthritis, right knee: Principal | ICD-10-CM | POA: Insufficient documentation

## 2024-03-24 DIAGNOSIS — Z8673 Personal history of transient ischemic attack (TIA), and cerebral infarction without residual deficits: Secondary | ICD-10-CM | POA: Diagnosis not present

## 2024-03-24 DIAGNOSIS — M179 Osteoarthritis of knee, unspecified: Principal | ICD-10-CM | POA: Diagnosis present

## 2024-03-24 DIAGNOSIS — G8918 Other acute postprocedural pain: Secondary | ICD-10-CM | POA: Diagnosis not present

## 2024-03-24 DIAGNOSIS — Z7982 Long term (current) use of aspirin: Secondary | ICD-10-CM | POA: Diagnosis not present

## 2024-03-24 DIAGNOSIS — I5032 Chronic diastolic (congestive) heart failure: Secondary | ICD-10-CM | POA: Diagnosis not present

## 2024-03-24 DIAGNOSIS — E119 Type 2 diabetes mellitus without complications: Secondary | ICD-10-CM | POA: Insufficient documentation

## 2024-03-24 DIAGNOSIS — Z79899 Other long term (current) drug therapy: Secondary | ICD-10-CM | POA: Insufficient documentation

## 2024-03-24 DIAGNOSIS — Z96652 Presence of left artificial knee joint: Secondary | ICD-10-CM | POA: Diagnosis not present

## 2024-03-24 DIAGNOSIS — Z01818 Encounter for other preprocedural examination: Secondary | ICD-10-CM

## 2024-03-24 DIAGNOSIS — F418 Other specified anxiety disorders: Secondary | ICD-10-CM

## 2024-03-24 LAB — GLUCOSE, CAPILLARY
Glucose-Capillary: 159 mg/dL — ABNORMAL HIGH (ref 70–99)
Glucose-Capillary: 101 mg/dL — ABNORMAL HIGH (ref 70–99)
Glucose-Capillary: 223 mg/dL — ABNORMAL HIGH (ref 70–99)
Glucose-Capillary: 228 mg/dL — ABNORMAL HIGH (ref 70–99)

## 2024-03-24 SURGERY — ARTHROPLASTY, KNEE, TOTAL
Anesthesia: Spinal | Site: Knee | Laterality: Right

## 2024-03-24 MED ORDER — DEXAMETHASONE SODIUM PHOSPHATE 4 MG/ML IJ SOLN
INTRAMUSCULAR | Status: DC | PRN
  Administered 2024-03-24: 5 mg via PERINEURAL

## 2024-03-24 MED ORDER — CEFAZOLIN SODIUM-DEXTROSE 2-4 GM/100ML-% IV SOLN
2.0000 g | Freq: Four times a day (QID) | INTRAVENOUS | Status: AC
  Administered 2024-03-24 (×2): 2 g via INTRAVENOUS
  Filled 2024-03-24 (×2): qty 100

## 2024-03-24 MED ORDER — TRANEXAMIC ACID-NACL 1000-0.7 MG/100ML-% IV SOLN
1000.0000 mg | INTRAVENOUS | Status: AC
Start: 2024-03-24 — End: 2024-03-24
  Administered 2024-03-24: 1000 mg via INTRAVENOUS
  Filled 2024-03-24: qty 100

## 2024-03-24 MED ORDER — INSULIN GLARGINE-YFGN 100 UNIT/ML ~~LOC~~ SOLN
73.0000 [IU] | Freq: Every day | SUBCUTANEOUS | Status: DC
  Administered 2024-03-24 – 2024-03-25 (×2): 73 [IU] via SUBCUTANEOUS
  Filled 2024-03-24 (×3): qty 0.73

## 2024-03-24 MED ORDER — ONDANSETRON HCL 4 MG PO TABS: 4.0000 mg | ORAL_TABLET | Freq: Four times a day (QID) | ORAL | Status: DC | PRN

## 2024-03-24 MED ORDER — FLEET ENEMA RE ENEM: 1.0000 | ENEMA | Freq: Once | RECTAL | Status: DC | PRN

## 2024-03-24 MED ORDER — ROPIVACAINE HCL 5 MG/ML IJ SOLN
INTRAMUSCULAR | Status: DC | PRN
  Administered 2024-03-24: 30 mL via PERINEURAL

## 2024-03-24 MED ORDER — VERAPAMIL HCL ER 120 MG PO TBCR
120.0000 mg | EXTENDED_RELEASE_TABLET | Freq: Every day | ORAL | Status: DC
  Administered 2024-03-24 – 2024-03-25 (×2): 120 mg via ORAL
  Filled 2024-03-24 (×2): qty 1

## 2024-03-24 MED ORDER — RIVAROXABAN 10 MG PO TABS
10.0000 mg | ORAL_TABLET | Freq: Every day | ORAL | Status: DC
  Administered 2024-03-25 – 2024-03-26 (×2): 10 mg via ORAL
  Filled 2024-03-24 (×2): qty 1

## 2024-03-24 MED ORDER — SPIRONOLACTONE 25 MG PO TABS
50.0000 mg | ORAL_TABLET | Freq: Every day | ORAL | Status: DC
  Administered 2024-03-25 – 2024-03-26 (×2): 50 mg via ORAL
  Filled 2024-03-24 (×2): qty 2

## 2024-03-24 MED ORDER — LACTATED RINGERS IV SOLN: INTRAVENOUS | Status: DC

## 2024-03-24 MED ORDER — CEFAZOLIN SODIUM-DEXTROSE 2-4 GM/100ML-% IV SOLN
2.0000 g | INTRAVENOUS | Status: AC
Start: 2024-03-24 — End: 2024-03-24
  Administered 2024-03-24: 2 g via INTRAVENOUS
  Filled 2024-03-24: qty 100

## 2024-03-24 MED ORDER — SODIUM CHLORIDE 0.9 % IV SOLN: INTRAVENOUS | Status: DC

## 2024-03-24 MED ORDER — POVIDONE-IODINE 10 % EX SWAB: 2.0000 | Freq: Once | CUTANEOUS | Status: DC

## 2024-03-24 MED ORDER — DROPERIDOL 2.5 MG/ML IJ SOLN: 0.6250 mg | Freq: Once | INTRAMUSCULAR | Status: DC | PRN

## 2024-03-24 MED ORDER — SODIUM CHLORIDE 0.9 % IR SOLN
Status: DC | PRN
  Administered 2024-03-24: 1000 mL

## 2024-03-24 MED ORDER — ORAL CARE MOUTH RINSE: 15.0000 mL | Freq: Once | OROMUCOSAL | Status: AC

## 2024-03-24 MED ORDER — METOCLOPRAMIDE HCL 5 MG/ML IJ SOLN: 5.0000 mg | Freq: Three times a day (TID) | INTRAMUSCULAR | Status: DC | PRN

## 2024-03-24 MED ORDER — MIDAZOLAM HCL 2 MG/2ML IJ SOLN: 1.0000 mg | INTRAMUSCULAR | Status: DC

## 2024-03-24 MED ORDER — PHENOL 1.4 % MT LIQD: 1.0000 | OROMUCOSAL | Status: DC | PRN

## 2024-03-24 MED ORDER — EMPAGLIFLOZIN 10 MG PO TABS
10.0000 mg | ORAL_TABLET | Freq: Every day | ORAL | Status: DC
  Administered 2024-03-24 – 2024-03-26 (×3): 10 mg via ORAL
  Filled 2024-03-24 (×3): qty 1

## 2024-03-24 MED ORDER — MIDAZOLAM HCL 5 MG/5ML IJ SOLN
INTRAMUSCULAR | Status: DC | PRN
  Administered 2024-03-24: 1 mg via INTRAVENOUS

## 2024-03-24 MED ORDER — CHLORHEXIDINE GLUCONATE 0.12 % MT SOLN
15.0000 mL | Freq: Once | OROMUCOSAL | Status: AC
Start: 2024-03-24 — End: 2024-03-24
  Administered 2024-03-24: 15 mL via OROMUCOSAL

## 2024-03-24 MED ORDER — ACETAMINOPHEN 10 MG/ML IV SOLN
1000.0000 mg | Freq: Four times a day (QID) | INTRAVENOUS | Status: DC
  Administered 2024-03-24: 1000 mg via INTRAVENOUS
  Filled 2024-03-24: qty 100

## 2024-03-24 MED ORDER — ONDANSETRON HCL 4 MG/2ML IJ SOLN
INTRAMUSCULAR | Status: DC | PRN
Start: 2024-03-24 — End: 2024-03-24
  Administered 2024-03-24: 4 mg via INTRAVENOUS

## 2024-03-24 MED ORDER — ATORVASTATIN CALCIUM 40 MG PO TABS
80.0000 mg | ORAL_TABLET | Freq: Every day | ORAL | Status: DC
  Administered 2024-03-25 – 2024-03-26 (×2): 80 mg via ORAL
  Filled 2024-03-24 (×2): qty 2

## 2024-03-24 MED ORDER — CHLORHEXIDINE GLUCONATE 0.12 % MT SOLN
15.0000 mL | Freq: Once | OROMUCOSAL | Status: AC
  Administered 2024-03-24: 15 mL via OROMUCOSAL

## 2024-03-24 MED ORDER — ACETAMINOPHEN 325 MG PO TABS
325.0000 mg | ORAL_TABLET | Freq: Four times a day (QID) | ORAL | Status: DC | PRN
  Administered 2024-03-25 (×2): 650 mg via ORAL
  Filled 2024-03-24 (×2): qty 2

## 2024-03-24 MED ORDER — DOCUSATE SODIUM 100 MG PO CAPS
100.0000 mg | ORAL_CAPSULE | Freq: Two times a day (BID) | ORAL | Status: DC
  Administered 2024-03-24 – 2024-03-26 (×5): 100 mg via ORAL
  Filled 2024-03-24 (×5): qty 1

## 2024-03-24 MED ORDER — PROPOFOL 1000 MG/100ML IV EMUL
INTRAVENOUS | Status: AC
  Filled 2024-03-24: qty 100

## 2024-03-24 MED ORDER — FENTANYL CITRATE PF 50 MCG/ML IJ SOSY
50.0000 ug | PREFILLED_SYRINGE | INTRAMUSCULAR | Status: DC
  Administered 2024-03-24: 50 ug via INTRAVENOUS
  Filled 2024-03-24: qty 2

## 2024-03-24 MED ORDER — BUPIVACAINE LIPOSOME 1.3 % IJ SUSP: 20.0000 mL | Freq: Once | INTRAMUSCULAR | Status: DC

## 2024-03-24 MED ORDER — CYCLOBENZAPRINE HCL 10 MG PO TABS
10.0000 mg | ORAL_TABLET | Freq: Three times a day (TID) | ORAL | Status: DC | PRN
  Administered 2024-03-24 – 2024-03-25 (×4): 10 mg via ORAL
  Filled 2024-03-24 (×4): qty 1

## 2024-03-24 MED ORDER — HYDROMORPHONE HCL 1 MG/ML IJ SOLN
0.2500 mg | INTRAMUSCULAR | Status: DC | PRN
  Administered 2024-03-24: 0.5 mg via INTRAVENOUS

## 2024-03-24 MED ORDER — MUPIROCIN 2 % EX OINT: 1.0000 | TOPICAL_OINTMENT | Freq: Two times a day (BID) | CUTANEOUS | 0 refills | Status: DC

## 2024-03-24 MED ORDER — ONDANSETRON HCL 4 MG/2ML IJ SOLN
4.0000 mg | Freq: Four times a day (QID) | INTRAMUSCULAR | Status: DC | PRN
  Administered 2024-03-25 – 2024-03-26 (×2): 4 mg via INTRAVENOUS
  Filled 2024-03-24 (×2): qty 2

## 2024-03-24 MED ORDER — PROPOFOL 10 MG/ML IV BOLUS
INTRAVENOUS | Status: DC | PRN
  Administered 2024-03-24: 20 mg via INTRAVENOUS
  Administered 2024-03-24: 100 ug/kg/min via INTRAVENOUS

## 2024-03-24 MED ORDER — HYDROMORPHONE HCL 2 MG PO TABS
2.0000 mg | ORAL_TABLET | ORAL | Status: DC | PRN
  Administered 2024-03-24 – 2024-03-26 (×7): 2 mg via ORAL
  Filled 2024-03-24 (×4): qty 1
  Filled 2024-03-24: qty 2
  Filled 2024-03-24 (×2): qty 1

## 2024-03-24 MED ORDER — CITALOPRAM HYDROBROMIDE 20 MG PO TABS
40.0000 mg | ORAL_TABLET | Freq: Every day | ORAL | Status: DC
  Administered 2024-03-25 – 2024-03-26 (×2): 40 mg via ORAL
  Filled 2024-03-24 (×2): qty 2

## 2024-03-24 MED ORDER — EZETIMIBE 10 MG PO TABS
10.0000 mg | ORAL_TABLET | Freq: Every day | ORAL | Status: DC
  Administered 2024-03-25 – 2024-03-26 (×2): 10 mg via ORAL
  Filled 2024-03-24 (×2): qty 1

## 2024-03-24 MED ORDER — DIPHENHYDRAMINE HCL 12.5 MG/5ML PO ELIX: 12.5000 mg | ORAL_SOLUTION | ORAL | Status: DC | PRN

## 2024-03-24 MED ORDER — HYDROMORPHONE HCL 1 MG/ML IJ SOLN
0.5000 mg | INTRAMUSCULAR | Status: DC | PRN
  Administered 2024-03-24 – 2024-03-25 (×2): 1 mg via INTRAVENOUS
  Filled 2024-03-24 (×2): qty 1

## 2024-03-24 MED ORDER — POLYETHYLENE GLYCOL 3350 17 G PO PACK: 17.0000 g | PACK | Freq: Every day | ORAL | Status: DC | PRN

## 2024-03-24 MED ORDER — GABAPENTIN 300 MG PO CAPS
300.0000 mg | ORAL_CAPSULE | Freq: Once | ORAL | Status: DC
  Filled 2024-03-24: qty 1

## 2024-03-24 MED ORDER — MENTHOL 3 MG MT LOZG: 1.0000 | LOZENGE | OROMUCOSAL | Status: DC | PRN

## 2024-03-24 MED ORDER — METOCLOPRAMIDE HCL 5 MG PO TABS: 5.0000 mg | ORAL_TABLET | Freq: Three times a day (TID) | ORAL | Status: DC | PRN

## 2024-03-24 MED ORDER — CHLORHEXIDINE GLUCONATE 4 % EX SOLN: 1.0000 | CUTANEOUS | 1 refills | Status: DC

## 2024-03-24 MED ORDER — PROPOFOL 10 MG/ML IV BOLUS
INTRAVENOUS | Status: AC
  Filled 2024-03-24: qty 20

## 2024-03-24 MED ORDER — HYDROMORPHONE HCL 1 MG/ML IJ SOLN
INTRAMUSCULAR | Status: AC
  Filled 2024-03-24: qty 1

## 2024-03-24 MED ORDER — SODIUM CHLORIDE (PF) 0.9 % IJ SOLN
INTRAMUSCULAR | Status: AC
  Filled 2024-03-24: qty 10

## 2024-03-24 MED ORDER — FENTANYL CITRATE (PF) 100 MCG/2ML IJ SOLN
INTRAMUSCULAR | Status: DC | PRN
  Administered 2024-03-24: 50 ug via INTRAVENOUS

## 2024-03-24 MED ORDER — INSULIN ASPART 100 UNIT/ML IJ SOLN
0.0000 [IU] | Freq: Every day | INTRAMUSCULAR | Status: DC
  Administered 2024-03-24: 2 [IU] via SUBCUTANEOUS

## 2024-03-24 MED ORDER — 0.9 % SODIUM CHLORIDE (POUR BTL) OPTIME
TOPICAL | Status: DC | PRN
  Administered 2024-03-24: 1000 mL

## 2024-03-24 MED ORDER — GLIMEPIRIDE 4 MG PO TABS
4.0000 mg | ORAL_TABLET | Freq: Two times a day (BID) | ORAL | Status: DC
  Administered 2024-03-24 – 2024-03-26 (×5): 4 mg via ORAL
  Filled 2024-03-24 (×5): qty 1

## 2024-03-24 MED ORDER — STERILE WATER FOR IRRIGATION IR SOLN
Status: DC | PRN
  Administered 2024-03-24: 2000 mL

## 2024-03-24 MED ORDER — METOPROLOL SUCCINATE ER 25 MG PO TB24
25.0000 mg | ORAL_TABLET | Freq: Every day | ORAL | Status: DC
  Administered 2024-03-25 – 2024-03-26 (×2): 25 mg via ORAL
  Filled 2024-03-24 (×2): qty 1

## 2024-03-24 MED ORDER — BUPIVACAINE IN DEXTROSE 0.75-8.25 % IT SOLN
INTRATHECAL | Status: DC | PRN
  Administered 2024-03-24: 1.5 mL via INTRATHECAL

## 2024-03-24 MED ORDER — PANTOPRAZOLE SODIUM 40 MG PO TBEC
40.0000 mg | DELAYED_RELEASE_TABLET | Freq: Every day | ORAL | Status: DC
  Administered 2024-03-24 – 2024-03-26 (×3): 40 mg via ORAL
  Filled 2024-03-24 (×2): qty 1
  Filled 2024-03-24: qty 2
  Filled 2024-03-24: qty 1

## 2024-03-24 MED ORDER — MIDAZOLAM HCL 2 MG/2ML IJ SOLN
INTRAMUSCULAR | Status: AC
  Filled 2024-03-24: qty 2

## 2024-03-24 MED ORDER — GABAPENTIN 300 MG PO CAPS
300.0000 mg | ORAL_CAPSULE | Freq: Three times a day (TID) | ORAL | Status: DC
  Administered 2024-03-24 – 2024-03-26 (×6): 300 mg via ORAL
  Filled 2024-03-24 (×6): qty 1

## 2024-03-24 MED ORDER — BUPIVACAINE LIPOSOME 1.3 % IJ SUSP
INTRAMUSCULAR | Status: AC
  Filled 2024-03-24: qty 20

## 2024-03-24 MED ORDER — DEXAMETHASONE SODIUM PHOSPHATE 10 MG/ML IJ SOLN
8.0000 mg | Freq: Once | INTRAMUSCULAR | Status: AC
  Administered 2024-03-24: 5 mg via INTRAVENOUS

## 2024-03-24 MED ORDER — CLONIDINE HCL (ANALGESIA) 100 MCG/ML EP SOLN
EPIDURAL | Status: DC | PRN
  Administered 2024-03-24: 80 ug

## 2024-03-24 MED ORDER — INSULIN ASPART 100 UNIT/ML IJ SOLN
0.0000 [IU] | INTRAMUSCULAR | Status: DC | PRN
  Administered 2024-03-24: 2 [IU] via SUBCUTANEOUS
  Filled 2024-03-24: qty 1

## 2024-03-24 MED ORDER — PHENYLEPHRINE HCL-NACL 20-0.9 MG/250ML-% IV SOLN
INTRAVENOUS | Status: DC | PRN
  Administered 2024-03-24: 20 ug/min via INTRAVENOUS

## 2024-03-24 MED ORDER — ORAL CARE MOUTH RINSE
15.0000 mL | Freq: Once | OROMUCOSAL | Status: AC
Start: 2024-03-24 — End: 2024-03-24

## 2024-03-24 MED ORDER — FENTANYL CITRATE (PF) 100 MCG/2ML IJ SOLN
INTRAMUSCULAR | Status: AC
  Filled 2024-03-24: qty 2

## 2024-03-24 MED ORDER — BUPROPION HCL ER (XL) 150 MG PO TB24
150.0000 mg | ORAL_TABLET | Freq: Every morning | ORAL | Status: DC
  Administered 2024-03-25 – 2024-03-26 (×2): 150 mg via ORAL
  Filled 2024-03-24 (×2): qty 1

## 2024-03-24 MED ORDER — INSULIN ASPART 100 UNIT/ML IJ SOLN
0.0000 [IU] | Freq: Three times a day (TID) | INTRAMUSCULAR | Status: DC
  Administered 2024-03-24: 5 [IU] via SUBCUTANEOUS
  Administered 2024-03-25 – 2024-03-26 (×3): 3 [IU] via SUBCUTANEOUS

## 2024-03-24 MED ORDER — ALPRAZOLAM 0.5 MG PO TABS
1.0000 mg | ORAL_TABLET | Freq: Three times a day (TID) | ORAL | Status: DC | PRN
  Administered 2024-03-25: 1 mg via ORAL
  Filled 2024-03-24: qty 2

## 2024-03-24 MED ORDER — SODIUM CHLORIDE (PF) 0.9 % IJ SOLN
INTRAMUSCULAR | Status: AC
Start: 2024-03-24 — End: 2024-03-24
  Filled 2024-03-24: qty 50

## 2024-03-24 MED ORDER — SODIUM CHLORIDE (PF) 0.9 % IJ SOLN
INTRAMUSCULAR | Status: DC | PRN
  Administered 2024-03-24: 80 mL

## 2024-03-24 MED ORDER — BISACODYL 10 MG RE SUPP: 10.0000 mg | Freq: Every day | RECTAL | Status: DC | PRN

## 2024-03-24 SURGICAL SUPPLY — 43 items
ATTUNE MED DOME PAT 32 KNEE (Knees) IMPLANT
ATTUNE PS FEM RT SZ 4 CEM KNEE (Femur) IMPLANT
ATTUNE PSRP INSR SZ4 8 KNEE (Insert) IMPLANT
BAG COUNTER SPONGE SURGICOUNT (BAG) IMPLANT
BAG ZIPLOCK 12X15 (MISCELLANEOUS) ×1 IMPLANT
BASE TIBIAL ROT PLAT SZ 3 KNEE (Knees) IMPLANT
BLADE SAG 18X100X1.27 (BLADE) ×1 IMPLANT
BLADE SAW SGTL 11.0X1.19X90.0M (BLADE) ×1 IMPLANT
BNDG ELASTIC 6INX 5YD STR LF (GAUZE/BANDAGES/DRESSINGS) ×1 IMPLANT
BOWL SMART MIX CTS (DISPOSABLE) ×1 IMPLANT
CEMENT HV SMART SET (Cement) ×2 IMPLANT
COVER SURGICAL LIGHT HANDLE (MISCELLANEOUS) ×1 IMPLANT
CUFF TRNQT CYL 34X4.125X (TOURNIQUET CUFF) ×1 IMPLANT
DERMABOND ADVANCED .7 DNX12 (GAUZE/BANDAGES/DRESSINGS) ×1 IMPLANT
DRAPE U-SHAPE 47X51 STRL (DRAPES) ×1 IMPLANT
DRSG AQUACEL AG ADV 3.5X10 (GAUZE/BANDAGES/DRESSINGS) ×1 IMPLANT
DURAPREP 26ML APPLICATOR (WOUND CARE) ×1 IMPLANT
ELECT REM PT RETURN 15FT ADLT (MISCELLANEOUS) ×1 IMPLANT
GLOVE BIO SURGEON STRL SZ 6.5 (GLOVE) IMPLANT
GLOVE BIO SURGEON STRL SZ7 (GLOVE) IMPLANT
GLOVE BIO SURGEON STRL SZ8 (GLOVE) ×1 IMPLANT
GLOVE BIOGEL PI IND STRL 7.0 (GLOVE) ×1 IMPLANT
GLOVE BIOGEL PI IND STRL 8 (GLOVE) ×1 IMPLANT
GOWN STRL REUS W/ TWL LRG LVL3 (GOWN DISPOSABLE) ×1 IMPLANT
HOLDER FOLEY CATH W/STRAP (MISCELLANEOUS) ×1 IMPLANT
IMMOBILIZER KNEE 20 THIGH 36 (SOFTGOODS) ×1 IMPLANT
KIT TURNOVER KIT A (KITS) ×1 IMPLANT
MANIFOLD NEPTUNE II (INSTRUMENTS) ×1 IMPLANT
NS IRRIG 1000ML POUR BTL (IV SOLUTION) ×1 IMPLANT
PACK TOTAL KNEE CUSTOM (KITS) ×1 IMPLANT
PADDING CAST COTTON 6X4 STRL (CAST SUPPLIES) ×2 IMPLANT
PENCIL SMOKE EVACUATOR (MISCELLANEOUS) ×1 IMPLANT
PIN STEINMAN FIXATION KNEE (PIN) IMPLANT
PROTECTOR NERVE ULNAR (MISCELLANEOUS) ×1 IMPLANT
SET HNDPC FAN SPRY TIP SCT (DISPOSABLE) ×1 IMPLANT
SUT MNCRL AB 4-0 PS2 18 (SUTURE) ×1 IMPLANT
SUT VIC AB 2-0 CT1 TAPERPNT 27 (SUTURE) ×3 IMPLANT
SUTURE STRATFX 0 PDS 27 VIOLET (SUTURE) ×1 IMPLANT
TOWEL GREEN STERILE FF (TOWEL DISPOSABLE) ×1 IMPLANT
TRAY FOLEY MTR SLVR 16FR STAT (SET/KITS/TRAYS/PACK) ×1 IMPLANT
TUBE SUCTION HIGH CAP CLEAR NV (SUCTIONS) ×1 IMPLANT
WATER STERILE IRR 1000ML POUR (IV SOLUTION) ×2 IMPLANT
WRAP KNEE MAXI GEL POST OP (GAUZE/BANDAGES/DRESSINGS) ×1 IMPLANT

## 2024-03-24 NOTE — Transfer of Care (Signed)
 Immediate Anesthesia Transfer of Care Note  Patient: [name] [name]  Procedure(s) Performed: ARTHROPLASTY, KNEE, TOTAL (Right: Knee)  Patient Location: PACU  Anesthesia Type:Spinal  Level of Consciousness: drowsy and responds to stimulation  Airway & Oxygen Therapy: Patient Spontanous Breathing and Patient connected to face mask oxygen  Post-op Assessment: Report given to RN and Post -op Vital signs reviewed and stable  Post vital signs: Reviewed and stable  Last Vitals:  Vitals Value Taken Time  BP 98/44 03/24/24 09:50  Temp    Pulse 60 03/24/24 09:55  Resp 19 03/24/24 09:55  SpO2 100 % 03/24/24 09:55  Vitals shown include unfiled device data.  Last Pain:  Vitals:   03/24/24 0633  TempSrc:   PainSc: 0-No pain         Complications: No notable events documented.

## 2024-03-24 NOTE — Op Note (Signed)
 OPERATIVE REPORT-TOTAL KNEE ARTHROPLASTY   Pre-operative diagnosis- Osteoarthritis  Right knee(s)  Post-operative diagnosis- Osteoarthritis Right knee(s)  Procedure-  Right  Total Knee Arthroplasty  Surgeon- Dempsey GAILS. Aretta Stetzel, MD  Assistant- Zelda Kobs, PA-C   Anesthesia-  Adductor canal block and spinal  EBL-25 mL   Drains None  Tourniquet time-  Total Tourniquet Time Documented: Thigh (Right) - 34 minutes Total: Thigh (Right) - 34 minutes     Complications- None  Condition-PACU - hemodynamically stable.   Brief Clinical Note  [name] [name] is a 63 y.o. year old female with end stage OA of her right knee with progressively worsening pain and dysfunction. She has constant pain, with activity and at rest and significant functional deficits with difficulties even with ADLs. She has had extensive non-op management including analgesics, injections of cortisone and viscosupplements, and home exercise program, but remains in significant pain with significant dysfunction.Radiographs show bone on bone arthritis medial and patellofemoral. She presents now for right Total Knee Arthroplasty.     Procedure in detail---   The patient is brought into the operating room and positioned supine on the operating table. After successful administration of  Adductor canal block and spinal,   a tourniquet is placed high on the  Right thigh(s) and the lower extremity is prepped and draped in the usual sterile fashion. Time out is performed by the operating team and then the  Right lower extremity is wrapped in Esmarch, knee flexed and the tourniquet inflated to 300 mmHg.       A midline incision is made with a ten blade through the subcutaneous tissue to the level of the extensor mechanism. A fresh blade is used to make a medial parapatellar arthrotomy. Soft tissue over the proximal medial tibia is subperiosteally elevated to the joint line with a knife and into the semimembranosus bursa with a Cobb  elevator. Soft tissue over the proximal lateral tibia is elevated with attention being paid to avoiding the patellar tendon on the tibial tubercle. The patella is everted, knee flexed 90 degrees and the ACL and PCL are removed. Findings are bone on bone medial and patellofemoral with large global osteophytes        The drill is used to create a starting hole in the distal femur and the canal is thoroughly irrigated with sterile saline to remove the fatty contents. The 5 degree Right  valgus alignment guide is placed into the femoral canal and the distal femoral cutting block is pinned to remove 10 mm off the distal femur. Resection is made with an oscillating saw.      The tibia is subluxed forward and the menisci are removed. The extramedullary alignment guide is placed referencing proximally at the medial aspect of the tibial tubercle and distally along the second metatarsal axis and tibial crest. The block is pinned to remove 2mm off the more deficient medial  side. Resection is made with an oscillating saw. Size 3is the most appropriate size for the tibia and the proximal tibia is prepared with the modular drill and keel punch for that size.      The femoral sizing guide is placed and size 4 is most appropriate. Rotation is marked off the epicondylar axis and confirmed by creating a rectangular flexion gap at 90 degrees. The size 4 cutting block is pinned in this rotation and the anterior, posterior and chamfer cuts are made with the oscillating saw. The intercondylar block is then placed and that cut is made.  Trial size 3 tibial component, trial size 4 posterior stabilized femur and a 8  mm posterior stabilized rotating platform insert trial is placed. Full extension is achieved with excellent varus/valgus and anterior/posterior balance throughout full range of motion. The patella is everted and thickness measured to be 21  mm. Free hand resection is taken to 12 mm, a 32 template is placed, lug holes  are drilled, trial patella is placed, and it tracks normally. Osteophytes are removed off the posterior femur with the trial in place. All trials are removed and the cut bone surfaces prepared with pulsatile lavage. Cement is mixed and once ready for implantation, the size 3 tibial implant, size  4 posterior stabilized femoral component, and the size 32 patella are cemented in place and the patella is held with the clamp. The trial insert is placed and the knee held in full extension. The Exparel  (20 ml mixed with 60 ml saline) is injected into the extensor mechanism, posterior capsule, medial and lateral gutters and subcutaneous tissues.  All extruded cement is removed and once the cement is hard the permanent 8 mm posterior stabilized rotating platform insert is placed into the tibial tray.      The wound is copiously irrigated with saline solution and the extensor mechanism closed with # 0 Stratofix suture. The tourniquet is released for a total tourniquet time of 34  minutes. Flexion against gravity is 140 degrees and the patella tracks normally. Subcutaneous tissue is closed with 2.0 vicryl and subcuticular with running 4.0 Monocryl. The incision is cleaned and dried and steri-strips and a bulky sterile dressing are applied. The limb is placed into a knee immobilizer and the patient is awakened and transported to recovery in stable condition.      Please note that a surgical assistant was a medical necessity for this procedure in order to perform it in a safe and expeditious manner. Surgical assistant was necessary to retract the ligaments and vital neurovascular structures to prevent injury to them and also necessary for proper positioning of the limb to allow for anatomic placement of the prosthesis.   Dempsey ROCKFORD Tywon Niday, MD    03/24/2024, 9:29 AM

## 2024-03-24 NOTE — Progress Notes (Signed)
 Orthopedic Tech Progress Note Patient Details:  [name] [name] 1960-10-19 [id number]  CPM Right Knee CPM Right Knee: Off Right Knee Flexion (Degrees): 40 Right Knee Extension (Degrees): 10  Post Interventions Patient Tolerated: Well Cpm removed by PT. [name] 03/24/2024, 2:04 PM

## 2024-03-24 NOTE — Progress Notes (Signed)
 Orthopedic Tech Progress Note Patient Details:  [name] [name] [birth year] [id number]  CPM Right Knee CPM Right Knee: On Right Knee Flexion (Degrees): 40 Right Knee Extension (Degrees): 10  Post Interventions Patient Tolerated: Well  [place] A Wonda 03/24/2024, 10:00 AM

## 2024-03-24 NOTE — Evaluation (Addendum)
 Physical Therapy Evaluation Patient Details Name: [name] [name] MRN: [id number] DOB: April 24, 1961 Today's Date: 03/24/2024  History of Present Illness  63 yo female s/p R TKA on 03/24/24. PMH: OA, AKI, dCHF, DM, panic d/o, HTN, PTSD, sleep apnea, CVA, L TKA 02/2023  Assisted pt to EOB with CGA--> min assist and STS with min assist.  Mobility then limited  d/t pt attempting to take steps against therapist advice, R knee buckling repeatedly and pt not following commands consistently--total assist required to safely lower pt to chair and prevent fall. Deficits likely d/t residual effects of regional nerve block and recent IV dilaudid  (pt was able to SLR with slight quad lag and answer PLOF questions, although somewhat lethargic)   Clinical Impression  Pt is s/p TKA resulting in the deficits listed below (see PT Problem List).  Pt will benefit from acute skilled PT to increase their independence and safety with mobility to allow discharge.          If plan is discharge home, recommend the following: Help with stairs or ramp for entrance;A little help with walking and/or transfers;A little help with bathing/dressing/bathroom;Assist for transportation;Direct supervision/assist for medications management;Assistance with cooking/housework   Can travel by private vehicle        Equipment Recommendations Rolling walker (2 wheels)  Recommendations for Other Services       Functional Status Assessment Patient has had a recent decline in their functional status and demonstrates the ability to make significant improvements in function in a reasonable and predictable amount of time.     Precautions / Restrictions Precautions Precautions: Knee;Fall Required Braces or Orthoses: Knee Immobilizer - Right Restrictions Weight Bearing Restrictions Per Provider Order: No Other Position/Activity Restrictions: WBAT      Mobility  Bed Mobility Overal bed mobility: Needs Assistance Bed Mobility: Supine to  Sit     Supine to sit: Contact guard     General bed mobility comments: for safety    Transfers Overall transfer level: Needs assistance Equipment used: Rolling walker (2 wheels) Transfers: Sit to/from Stand Sit to Stand: Min assist           General transfer comment: assist to rise and transition to RW; pt attempting to take steps against therapist advice, R knee buckling repeatedly, pt not following commands consistently--total assist to safely lower pt to chair and prevent fall.    Ambulation/Gait               General Gait Details: unable d/t lethargy and R knee buckling with WBing  Stairs            Wheelchair Mobility     Tilt Bed    Modified Rankin (Stroke Patients Only)       Balance Overall balance assessment: Needs assistance Sitting-balance support: Feet supported, No upper extremity supported Sitting balance-Leahy Scale: Fair     Standing balance support: Reliant on assistive device for balance, During functional activity Standing balance-Leahy Scale: Zero Standing balance comment: reliant on external assist and RW                             Pertinent Vitals/Pain Pain Assessment Pain Assessment: 0-10 Pain Score: 1  Pain Location: right knee Pain Descriptors / Indicators: Discomfort Pain Intervention(s): Limited activity within patient's tolerance, Monitored during session, Premedicated before session, Repositioned    Home Living Family/patient expects to be discharged to:: Private residence Living Arrangements: Spouse/significant other Available Help at Discharge:  Family;Available 24 hours/day Type of Home: House Home Access: Stairs to enter Entrance Stairs-Rails: None Entrance Stairs-Number of Steps: 3   Home Layout: One level Home Equipment: Agricultural consultant (2 wheels);Cane - single point Additional Comments: gave RW to her mother    Prior Function Prior Level of Function : Independent/Modified Independent                      Extremity/Trunk Assessment   Upper Extremity Assessment Upper Extremity Assessment: Overall WFL for tasks assessed    Lower Extremity Assessment Lower Extremity Assessment: RLE deficits/detail RLE Deficits / Details: ankle WFL , ~ 12 degree quad lag with SLR       Communication   Communication Communication: Impaired Factors Affecting Communication: Difficulty expressing self (likely d/t meds)    Cognition Arousal: Suspect due to medications, Lethargic Behavior During Therapy: Impulsive, Flat affect   PT - Cognitive impairments: No apparent impairments                       PT - Cognition Comments: mildly impulsive once mobility initiated, unable  to follow commands consistently Following commands: Impaired Following commands impaired: Follows multi-step commands with increased time, Follows one step commands inconsistently     Cueing Cueing Techniques: Verbal cues, Tactile cues     General Comments      Exercises Total Joint Exercises Ankle Circles/Pumps: AROM, Both, 5 reps   Assessment/Plan    PT Assessment Patient needs continued PT services  PT Problem List Decreased strength;Decreased mobility;Decreased range of motion;Decreased activity tolerance;Decreased balance;Decreased knowledge of use of DME;Decreased safety awareness       PT Treatment Interventions DME instruction;Therapeutic exercise;Gait training;Functional mobility training;Therapeutic activities;Patient/family education;Stair training    PT Goals (Current goals can be found in the Care Plan section)  Acute Rehab PT Goals PT Goal Formulation: With patient Time For Goal Achievement: 03/31/24 Potential to Achieve Goals: Good    Frequency 7X/week     Co-evaluation               AM-PAC PT 6 Clicks Mobility  Outcome Measure Help needed turning from your back to your side while in a flat bed without using bedrails?: A Little Help needed moving from  lying on your back to sitting on the side of a flat bed without using bedrails?: A Little Help needed moving to and from a bed to a chair (including a wheelchair)?: A Lot Help needed standing up from a chair using your arms (e.g., wheelchair or bedside chair)?: A Lot Help needed to walk in hospital room?: Total Help needed climbing 3-5 steps with a railing? : Total 6 Click Score: 12    End of Session Equipment Utilized During Treatment: Gait belt Activity Tolerance: Patient tolerated treatment well Patient left: with call bell/phone within reach;in chair;with chair alarm set;with family/visitor present Nurse Communication: Mobility status PT Visit Diagnosis: Other abnormalities of gait and mobility (R26.89);Difficulty in walking, not elsewhere classified (R26.2)    Time: 8675-8650 PT Time Calculation (min) (ACUTE ONLY): 25 min   Charges:   PT Evaluation $PT Eval Low Complexity: 1 Low PT Treatments $Therapeutic Activity: 8-22 mins PT General Charges $$ ACUTE PT VISIT: 1 Visit         Katrece Roediger, PT  Acute Rehab Dept [facility]) [phone]  03/24/2024   [facility] 03/24/2024, 1:59 PM

## 2024-03-24 NOTE — Anesthesia Procedure Notes (Addendum)
 Anesthesia Regional Block: Adductor canal block   Pre-Anesthetic Checklist: , timeout performed,  Correct Patient, Correct Site, Correct Laterality,  Correct Procedure, Correct Position, site marked,  Risks and benefits discussed,  Surgical consent,  Pre-op evaluation,  At surgeon's request and post-op pain management  Laterality: Lower and Right  Prep: chloraprep       Needles:  Injection technique: Single-shot  Needle Type: Stimiplex     Needle Length: 9cm  Needle Gauge: 21     Additional Needles:   Procedures:,,,, ultrasound used (permanent image in chart),,    Narrative:  Start time: 03/24/2024 7:31 AM End time: 03/24/2024 7:51 AM Injection made incrementally with aspirations every 5 mL.  Performed by: Personally  Anesthesiologist: Darlyn Rush, MD  Additional Notes: BP cuff, EKG monitors applied. Sedation begun. Artery and nerve location verified with ultrasound. Anesthetic injected incrementally (5ml), slowly, and after negative aspirations under direct u/s guidance. Good fascial/perineural spread. Tolerated well.

## 2024-03-24 NOTE — Anesthesia Procedure Notes (Signed)
 Procedure Name: MAC Date/Time: 03/24/2024 8:10 AM  Performed by: Franchot Delon RAMAN, CRNAPre-anesthesia Checklist: Patient identified, Emergency Drugs available, Suction available and Patient being monitored Patient Re-evaluated:Patient Re-evaluated prior to induction Oxygen Delivery Method: Simple face mask Ventilation: Oral airway inserted - appropriate to patient size Placement Confirmation: positive ETCO2 Dental Injury: Teeth and Oropharynx as per pre-operative assessment

## 2024-03-24 NOTE — Interval H&P Note (Signed)
 History and Physical Interval Note:  03/24/2024 6:49 AM  [name] [name]  has presented today for surgery, with the diagnosis of Right total knee arthroplasty.  The various methods of treatment have been discussed with the patient and family. After consideration of risks, benefits and other options for treatment, the patient has consented to  Procedure(s): ARTHROPLASTY, KNEE, TOTAL (Right) as a surgical intervention.  The patient's history has been reviewed, patient examined, no change in status, stable for surgery.  I have reviewed the patient's chart and labs.  Questions were answered to the patient's satisfaction.     Dempsey Killian Ress

## 2024-03-24 NOTE — Anesthesia Procedure Notes (Signed)
 Spinal  Patient location during procedure: OR Start time: 03/24/2024 8:18 AM End time: 03/24/2024 8:22 AM Reason for block: surgical anesthesia Staffing Performed: resident/CRNA  Resident/CRNA: Franchot Delon RAMAN, CRNA Performed by: Franchot Delon RAMAN, CRNA Authorized by: Darlyn Rush, MD   Preanesthetic Checklist Completed: patient identified, IV checked, site marked, risks and benefits discussed, surgical consent, monitors and equipment checked, pre-op evaluation and timeout performed Spinal Block Patient position: sitting Prep: DuraPrep Patient monitoring: heart rate, cardiac monitor, continuous pulse ox and blood pressure Approach: midline Location: L3-4 Injection technique: single-shot Needle Needle type: Pencan  Needle gauge: 24 G Needle length: 9 cm Assessment Sensory level: T4 Events: CSF return

## 2024-03-24 NOTE — Discharge Instructions (Signed)
 [name] Moan, MD Total Joint Specialist [facility] [address] [place], [place] [address] [phone]  TOTAL KNEE REPLACEMENT POSTOPERATIVE DIRECTIONS    Knee Rehabilitation, Guidelines Following Surgery  Results after knee surgery are often greatly improved when you follow the exercise, range of motion and muscle strengthening exercises prescribed by your doctor. Safety measures are also important to protect the knee from further injury. If any of these exercises cause you to have increased pain or swelling in your knee joint, decrease the amount until you are comfortable again and slowly increase them. If you have problems or questions, call your caregiver or physical therapist for advice.   BLOOD CLOT PREVENTION Take a 10 mg Xarelto  once a day for three weeks following surgery. Then take an 81 mg Aspirin  once a day for three weeks. Then discontinue Aspirin . You may resume your vitamins/supplements once you have discontinued the Xarelto . Do not take any NSAIDs (Advil, Aleve, Ibuprofen, Meloxicam, etc.) until you have discontinued the Xarelto .  GABAPENTIN  INSTRUCTIONS Take a 300 mg capsule three times a day for two weeks following surgery. Then resume 400 mg twice a day.  HOME CARE INSTRUCTIONS  Remove items at home which could result in a fall. This includes throw rugs or furniture in walking pathways.  ICE to the affected knee as much as tolerated. Icing helps control swelling. If the swelling is well controlled you will be more comfortable and rehab easier. Continue to use ice on the knee for pain and swelling from surgery. You may notice swelling that will progress down to the foot and ankle. This is normal after surgery. Elevate the leg when you are not up walking on it.    Continue to use the breathing machine which will help keep your temperature down. It is common for your temperature to cycle up and down following surgery, especially at night  when you are not up moving around and exerting yourself. The breathing machine keeps your lungs expanded and your temperature down. Do not place pillow under the operative knee, focus on keeping the knee straight while resting  DIET You may resume your previous home diet once you are discharged from the hospital.  DRESSING / WOUND CARE / SHOWERING Keep your bulky bandage on for 2 days. On the third post-operative day you may remove the Ace bandage and gauze. There is a waterproof adhesive bandage on your skin which will stay in place until your first follow-up appointment. Once you remove this you will not need to place another bandage You may begin showering 3 days following surgery, but do not submerge the incision under water .  ACTIVITY For the first 5 days, the key is rest and control of pain and swelling Do your home exercises twice a day starting on post-operative day 3. On the days you go to physical therapy, just do the home exercises once that day. You should rest, ice and elevate the leg for 50 minutes out of every hour. Get up and walk/stretch for 10 minutes per hour. After 5 days you can increase your activity slowly as tolerated. Walk with your walker as instructed. Use the walker until you are comfortable transitioning to a cane. Walk with the cane in the opposite hand of the operative leg. You may discontinue the cane once you are comfortable and walking steadily. Avoid periods of inactivity such as sitting longer than an hour when not asleep. This helps prevent blood clots.  You may discontinue the knee immobilizer  once you are able to perform a straight leg raise while lying down. You may resume a sexual relationship in one month or when given the OK by your doctor.  You may return to work once you are cleared by your doctor.  Do not drive a car for 6 weeks or until released by your surgeon.  Do not drive while taking narcotics.  TED HOSE STOCKINGS Wear the elastic stockings  on both legs for three weeks following surgery during the day. You may remove them at night for sleeping.  WEIGHT BEARING Weight bearing as tolerated with assist device (walker, cane, etc) as directed, use it as long as suggested by your surgeon or therapist, typically at least 4-6 weeks.  POSTOPERATIVE CONSTIPATION PROTOCOL Constipation - defined medically as fewer than three stools per week and severe constipation as less than one stool per week.  One of the most common issues patients have following surgery is constipation.  Even if you have a regular bowel pattern at home, your normal regimen is likely to be disrupted due to multiple reasons following surgery.  Combination of anesthesia, postoperative narcotics, change in appetite and fluid intake all can affect your bowels.  In order to avoid complications following surgery, here are some recommendations in order to help you during your recovery period.  Colace (docusate) - Pick up an over-the-counter form of Colace or another stool softener and take twice a day as long as you are requiring postoperative pain medications.  Take with a full glass of water  daily.  If you experience loose stools or diarrhea, hold the colace until you stool forms back up. If your symptoms do not get better within 1 week or if they get worse, check with your doctor. Dulcolax (bisacodyl ) - Pick up over-the-counter and take as directed by the product packaging as needed to assist with the movement of your bowels.  Take with a full glass of water .  Use this product as needed if not relieved by Colace only.  MiraLax  (polyethylene glycol) - Pick up over-the-counter to have on hand. MiraLax  is a solution that will increase the amount of water  in your bowels to assist with bowel movements.  Take as directed and can mix with a glass of water , juice, soda, coffee, or tea. Take if you go more than two days without a movement. Do not use MiraLax  more than once per day. Call your  doctor if you are still constipated or irregular after using this medication for 7 days in a row.  If you continue to have problems with postoperative constipation, please contact the office for further assistance and recommendations.  If you experience the worst abdominal pain ever or develop nausea or vomiting, please contact the office immediatly for further recommendations for treatment.  ITCHING If you experience itching with your medications, try taking only a single pain pill, or even half a pain pill at a time.  You can also use Benadryl  over the counter for itching or also to help with sleep.   MEDICATIONS See your medication summary on the "After Visit Summary" that the nursing staff will review with you prior to discharge.  You may have some home medications which will be placed on hold until you complete the course of blood thinner medication.  It is important for you to complete the blood thinner medication as prescribed by your surgeon.  Continue your approved medications as instructed at time of discharge.  PRECAUTIONS If you experience chest pain or shortness of  breath - call 911 immediately for transfer to the hospital emergency department.  If you develop a fever greater that 101 F, purulent drainage from wound, increased redness or drainage from wound, foul odor from the wound/dressing, or calf pain - CONTACT YOUR SURGEON.                                                   FOLLOW-UP APPOINTMENTS Make sure you keep all of your appointments after your operation with your surgeon and caregivers. You should call the office at the above phone number and make an appointment for approximately two weeks after the date of your surgery or on the date instructed by your surgeon outlined in the After Visit Summary.  RANGE OF MOTION AND STRENGTHENING EXERCISES  Rehabilitation of the knee is important following a knee injury or an operation. After just a few days of immobilization, the  muscles of the thigh which control the knee become weakened and shrink (atrophy). Knee exercises are designed to build up the tone and strength of the thigh muscles and to improve knee motion. Often times heat used for twenty to thirty minutes before working out will loosen up your tissues and help with improving the range of motion but do not use heat for the first two weeks following surgery. These exercises can be done on a training (exercise) mat, on the floor, on a table or on a bed. Use what ever works the best and is most comfortable for you Knee exercises include:  Leg Lifts - While your knee is still immobilized in a splint or cast, you can do straight leg raises. Lift the leg to 60 degrees, hold for 3 sec, and slowly lower the leg. Repeat 10-20 times 2-3 times daily. Perform this exercise against resistance later as your knee gets better.  Quad and Hamstring Sets - Tighten up the muscle on the front of the thigh (Quad) and hold for 5-10 sec. Repeat this 10-20 times hourly. Hamstring sets are done by pushing the foot backward against an object and holding for 5-10 sec. Repeat as with quad sets.  Leg Slides: Lying on your back, slowly slide your foot toward your buttocks, bending your knee up off the floor (only go as far as is comfortable). Then slowly slide your foot back down until your leg is flat on the floor again. Angel Wings: Lying on your back spread your legs to the side as far apart as you can without causing discomfort.  A rehabilitation program following serious knee injuries can speed recovery and prevent re-injury in the future due to weakened muscles. Contact your doctor or a physical therapist for more information on knee rehabilitation.   POST-OPERATIVE OPIOID TAPER INSTRUCTIONS: It is important to wean off of your opioid medication as soon as possible. If you do not need pain medication after your surgery it is ok to stop day one. Opioids include: Codeine, Hydrocodone (Norco,  Vicodin), Oxycodone (Percocet, oxycontin ) and hydromorphone  amongst others.  Long term and even short term use of opiods can cause: Increased pain response Dependence Constipation Depression Respiratory depression And more.  Withdrawal symptoms can include Flu like symptoms Nausea, vomiting And more Techniques to manage these symptoms Hydrate well Eat regular healthy meals Stay active Use relaxation techniques(deep breathing, meditating, yoga) Do Not substitute Alcohol to help with tapering If you have been  on opioids for less than two weeks and do not have pain than it is ok to stop all together.  Plan to wean off of opioids This plan should start within one week post op of your joint replacement. Maintain the same interval or time between taking each dose and first decrease the dose.  Cut the total daily intake of opioids by one tablet each day Next start to increase the time between doses. The last dose that should be eliminated is the evening dose.   IF YOU ARE TRANSFERRED TO A SKILLED REHAB FACILITY If the patient is transferred to a skilled rehab facility following release from the hospital, a list of the current medications will be sent to the facility for the patient to continue.  When discharged from the skilled rehab facility, please have the facility set up the patient's Home Health Physical Therapy prior to being released. Also, the skilled facility will be responsible for providing the patient with their medications at time of release from the facility to include their pain medication, the muscle relaxants, and their blood thinner medication. If the patient is still at the rehab facility at time of the two week follow up appointment, the skilled rehab facility will also need to assist the patient in arranging follow up appointment in our office and any transportation needs.  MAKE SURE YOU:  Understand these instructions.  Get help right away if you are not doing well or get  worse.   DENTAL ANTIBIOTICS:  In most cases prophylactic antibiotics for Dental procdeures after total joint surgery are not necessary.  Exceptions are as follows:  1. History of prior total joint infection  2. Severely immunocompromised (Organ Transplant, cancer chemotherapy, Rheumatoid biologic medications such as Humera)  3. Poorly controlled diabetes (A1C &gt; 8.0, blood glucose over 200)  If you have one of these conditions, contact your surgeon for an antibiotic prescription, prior to your dental procedure.    Pick up stool softner and laxative for home use following surgery while on pain medications. Do not submerge incision under water . Please use good hand washing techniques while changing dressing each day. May shower starting three days after surgery. Please use a clean towel to pat the incision dry following showers. Continue to use ice for pain and swelling after surgery. Do not use any lotions or creams on the incision until instructed by your surgeon.      Information on my medicine - XARELTO  (Rivaroxaban )  Why was Xarelto  prescribed for you? Xarelto  was prescribed for you to reduce the risk of blood clots forming after orthopedic surgery. The medical term for these abnormal blood clots is venous thromboembolism (VTE).  What do you need to know about xarelto  ? Take your Xarelto  ONCE DAILY at the same time every day. You may take it either with or without food.  If you have difficulty swallowing the tablet whole, you may crush it and mix in applesauce just prior to taking your dose.  Take Xarelto  exactly as prescribed by your doctor and DO NOT stop taking Xarelto  without talking to the doctor who prescribed the medication.  Stopping without other VTE prevention medication to take the place of Xarelto  may increase your risk of developing a clot.  After discharge, you should have regular check-up appointments with your healthcare provider that is  prescribing your Xarelto .    What do you do if you miss a dose? If you miss a dose, take it as soon as you remember on the  same day then continue your regularly scheduled once daily regimen the next day. Do not take two doses of Xarelto  on the same day.   Important Safety Information A possible side effect of Xarelto  is bleeding. You should call your healthcare provider right away if you experience any of the following: Bleeding from an injury or your nose that does not stop. Unusual colored urine (red or dark brown) or unusual colored stools (red or black). Unusual bruising for unknown reasons. A serious fall or if you hit your head (even if there is no bleeding).  Some medicines may interact with Xarelto  and might increase your risk of bleeding while on Xarelto . To help avoid this, consult your healthcare provider or pharmacist prior to using any new prescription or non-prescription medications, including herbals, vitamins, non-steroidal anti-inflammatory drugs (NSAIDs) and supplements.  This website has more information on Xarelto : [email or web address] .com.

## 2024-03-24 NOTE — Anesthesia Postprocedure Evaluation (Signed)
 Anesthesia Post Note  Patient: [name] [name]  Procedure(s) Performed: ARTHROPLASTY, KNEE, TOTAL (Right: Knee)     Patient location during evaluation: PACU Anesthesia Type: Spinal Level of consciousness: sedated and patient cooperative Pain management: pain level controlled Vital Signs Assessment: post-procedure vital signs reviewed and stable Respiratory status: spontaneous breathing Cardiovascular status: stable Anesthetic complications: no   No notable events documented.  Last Vitals:  Vitals:   03/24/24 1728 03/24/24 2031  BP: (!) 134/58 (!) 141/54  Pulse: 76 82  Resp: 17 18  Temp: 36.7 C 36.8 C  SpO2: 98% 97%    Last Pain:  Vitals:   03/24/24 2031  TempSrc: Oral  PainSc:                  Norleen Pope

## 2024-03-25 ENCOUNTER — Other Ambulatory Visit (HOSPITAL_COMMUNITY): Payer: Self-pay

## 2024-03-25 ENCOUNTER — Encounter (HOSPITAL_COMMUNITY): Payer: Self-pay | Admitting: Orthopedic Surgery

## 2024-03-25 DIAGNOSIS — M1711 Unilateral primary osteoarthritis, right knee: Secondary | ICD-10-CM | POA: Diagnosis not present

## 2024-03-25 LAB — BASIC METABOLIC PANEL WITH GFR
Anion gap: 9 (ref 5–15)
BUN: 20 mg/dL (ref 8–23)
CO2: 23 mmol/L (ref 22–32)
Calcium: 9 mg/dL (ref 8.9–10.3)
Chloride: 105 mmol/L (ref 98–111)
Creatinine, Ser: 1.06 mg/dL — ABNORMAL HIGH (ref 0.44–1.00)
GFR, Estimated: 59 mL/min — ABNORMAL LOW (ref >60)
Glucose, Bld: 153 mg/dL — ABNORMAL HIGH (ref 70–99)
Potassium: 3.8 mmol/L (ref 3.5–5.1)
Sodium: 137 mmol/L (ref 135–145)

## 2024-03-25 LAB — GLUCOSE, CAPILLARY
Glucose-Capillary: 158 mg/dL — ABNORMAL HIGH (ref 70–99)
Glucose-Capillary: 159 mg/dL — ABNORMAL HIGH (ref 70–99)
Glucose-Capillary: 205 mg/dL — ABNORMAL HIGH (ref 70–99)
Glucose-Capillary: 144 mg/dL — ABNORMAL HIGH (ref 70–99)

## 2024-03-25 LAB — CBC
HCT: 35.5 % — ABNORMAL LOW (ref 36.0–46.0)
Hemoglobin: 11 g/dL — ABNORMAL LOW (ref 12.0–15.0)
MCH: 29.6 pg (ref 26.0–34.0)
MCHC: 31 g/dL (ref 30.0–36.0)
MCV: 95.7 fL (ref 80.0–100.0)
Platelets: 142 K/uL — ABNORMAL LOW (ref 150–400)
RBC: 3.71 MIL/uL — ABNORMAL LOW (ref 3.87–5.11)
RDW: 12.5 % (ref 11.5–15.5)
WBC: 7.3 K/uL (ref 4.0–10.5)
nRBC: 0 % (ref 0.0–0.2)

## 2024-03-25 MED ORDER — RIVAROXABAN 10 MG PO TABS
10.0000 mg | ORAL_TABLET | Freq: Every day | ORAL | 0 refills | Status: AC
  Filled 2024-03-25: qty 20, 20d supply, fill #0

## 2024-03-25 MED ORDER — HYDROMORPHONE HCL 2 MG PO TABS
2.0000 mg | ORAL_TABLET | Freq: Four times a day (QID) | ORAL | 0 refills | Status: DC | PRN
  Filled 2024-03-25: qty 42, 7d supply, fill #0

## 2024-03-25 MED ORDER — MUPIROCIN 2 % EX OINT
1.0000 | TOPICAL_OINTMENT | Freq: Two times a day (BID) | CUTANEOUS | 0 refills | Status: AC
  Filled 2024-03-25: qty 66, 30d supply, fill #0

## 2024-03-25 MED ORDER — CHLORHEXIDINE GLUCONATE 4 % EX SOLN
1.0000 | CUTANEOUS | 1 refills | Status: DC
  Filled 2024-03-25: qty 946, 15d supply, fill #0

## 2024-03-25 MED ORDER — ONDANSETRON HCL 4 MG PO TABS
4.0000 mg | ORAL_TABLET | Freq: Four times a day (QID) | ORAL | 0 refills | Status: AC | PRN
  Filled 2024-03-25: qty 20, 5d supply, fill #0

## 2024-03-25 MED ORDER — CYCLOBENZAPRINE HCL 10 MG PO TABS
10.0000 mg | ORAL_TABLET | Freq: Three times a day (TID) | ORAL | 0 refills | Status: DC | PRN
  Filled 2024-03-25: qty 30, 10d supply, fill #0

## 2024-03-25 NOTE — Progress Notes (Addendum)
 Physical Therapy Treatment Patient Details Name: [name] [name] MRN: [id number] DOB: August 04, 1961 Today's Date: 03/25/2024   History of Present Illness 63 yo female s/p R TKA on 03/24/24. PMH: OA, AKI, dCHF, DM, panic d/o, HTN, PTSD, sleep apnea, CVA, L TKA 02/2023    PT Comments  POD # 1 pm session Spouse present AxO x 3 but slow to respond, slightly drunk/uncoordinated and unsteady.   General transfer comment: Had Spouse hands on assist Pt using safety belt with all transfers.  Educated on safe handling and current assist level.  Also had Spouse assist Pt to bathroom with Instructions to remain hands on all times until seated.  Pt required MAX VC's to take small steps and to keep walker close as well as to step back with LEFT first.  Pt still presents with impaired judgement and gait instability esp with turns.  HIGH FALL RISK. General Gait Details: Had Spouse hands on assist Pt using safety belt to amb in hallway.  Pt still requires MAX VC's on proper walker to self placement and to decrease step length as R knee wants to buckle.  Slightly improved from this morning session but still unsteady, impaired and a HIGH FALL RISK.  I don't remember it being this hard last time, stated Pt.  Had her other knee replaced last year. General stair comments: Pt has 3 steps and NO RAILS to enter her home.  First attempt up forward resulted in a near fall knee buckle Spouse and Therapist recovered.  Second attempt was up backward with KI applied to R LE.  Spouse hands on using safety belt assisted Pt up 3 steps backward with Therapist instruction on proper tech and handling.  VERY unsteady and difficult.  Pt lacks the upper body strength to support self on walker as well as impaired coordination and balance. Pt did NOT meet her mobility goals to safely D/C to home today.  Have arranged to meet Spouse tomorrow morning at 10 am for another Therapy session.   If plan is discharge home, recommend the  following: Help with stairs or ramp for entrance;A little help with walking and/or transfers;A little help with bathing/dressing/bathroom;Assist for transportation;Direct supervision/assist for medications management;Assistance with cooking/housework   Can travel by Training and development officer (2 wheels) ([employer])    Recommendations for Other Services       Precautions / Restrictions Precautions Precaution/Restrictions Comments: no pillow under knee Restrictions Weight Bearing Restrictions Per Provider Order: No RLE Weight Bearing Per Provider Order: Weight bearing as tolerated     Mobility  Bed Mobility   General bed mobility comments: OOB in recliner    Transfers Overall transfer level: Needs assistance Equipment used: Rolling walker (2 wheels) Transfers: Sit to/from Stand Sit to Stand: Min assist           General transfer comment: Had Spouse hands on assist Pt using safety belt with all transfers.  Educated on safe handling and current assist level.  Also had Spouse assist Pt to bathroom with Instructions to remain hands on all times until seated.  Pt required MAX VC's to take small steps and to keep walker close as well as to step back with LEFT first.  Pt still presents with impaired judgement and gait instability esp with turns.  HIGH FALL RISK.    Ambulation/Gait Ambulation/Gait assistance: Contact guard assist, Min assist Gait Distance (Feet): 28 Feet Assistive device: Rolling walker (2 wheels) Gait Pattern/deviations: Step-to  pattern, Decreased stance time - right Gait velocity: decreased     General Gait Details: Had Spouse hands on assist Pt using safety belt to amb in hallway.  Pt still requires MAX VC's on proper walker to self placement and to decrease step length as R knee wants to buckle.  Slightly improved from this morning session but still unsteady, impaired and a HIGH FALL RISK.  I don't remember it  being this hard last time, stated Pt.  Had her other knee replaced last year.   Stairs Stairs: Yes Stairs assistance: Max assist Stair Management: No rails, Step to pattern, Backwards Number of Stairs: 3 General stair comments: Pt has 3 steps and NO RAILS to enter her home.  First attempt up forward resulted in a near fall knee buckle Spouse and Therapist recovered.  Second attempt was up backward with KI applied to R LE.  Spouse hands on using safety belt assisted Pt up 3 steps backward with Therapist instruction on proper tech and handling.  VERY unsteady and difficult.  Pt lacks the upper body strength to support self on walker as well as impaired coordination and balance.   Wheelchair Mobility     Tilt Bed    Modified Rankin (Stroke Patients Only)       Balance                                            Communication Communication Communication: Impaired Factors Affecting Communication: Difficulty expressing self  Cognition Arousal: Alert Behavior During Therapy: Flat affect   PT - Cognitive impairments: Sequencing, Problem solving, Safety/Judgement, Attention                       PT - Cognition Comments: AxO x 3 but slow to respond, slightly drunk/uncoordinated and unsteady.  Suspect pain meds with flexirel taken this am. Following commands: Impaired Following commands impaired: Follows multi-step commands with increased time, Follows one step commands inconsistently    Cueing Cueing Techniques: Verbal cues, Tactile cues  Exercises      General Comments        Pertinent Vitals/Pain Pain Assessment Pain Assessment: 0-10 Pain Score: 2  Pain Location: right knee Pain Descriptors / Indicators: Dull Pain Intervention(s): Monitored during session, Premedicated before session, Repositioned, Ice applied    Home Living                          Prior Function            PT Goals (current goals can now be found in the  care plan section) Progress towards PT goals: Progressing toward goals    Frequency    7X/week      PT Plan      Co-evaluation              AM-PAC PT 6 Clicks Mobility   Outcome Measure  Help needed turning from your back to your side while in a flat bed without using bedrails?: A Lot Help needed moving from lying on your back to sitting on the side of a flat bed without using bedrails?: A Lot Help needed moving to and from a bed to a chair (including a wheelchair)?: A Lot Help needed standing up from a chair using your arms (e.g., wheelchair or bedside chair)?: A Lot Help needed to  walk in hospital room?: A Lot Help needed climbing 3-5 steps with a railing? : Total 6 Click Score: 11    End of Session Equipment Utilized During Treatment: Gait belt Activity Tolerance: Other (comment) (impaired/drunk/unsteady) Patient left: with call bell/phone within reach;in chair;with chair alarm set Nurse Communication: Mobility status PT Visit Diagnosis: Other abnormalities of gait and mobility (R26.89);Difficulty in walking, not elsewhere classified (R26.2)     Time: 8553-8464 PT Time Calculation (min) (ACUTE ONLY): 49 min  Charges:    $Gait Training: 23-37 mins $Therapeutic Activity: 8-22 mins PT General Charges $$ ACUTE PT VISIT: 1 Visit                    Katheryn Leap  PTA Acute  Rehabilitation Services Office M-F          [phone]

## 2024-03-25 NOTE — Plan of Care (Signed)
   Problem: Coping: Goal: Level of anxiety will decrease Outcome: Progressing   Problem: Pain Managment: Goal: General experience of comfort will improve and/or be controlled Outcome: Progressing   Problem: Safety: Goal: Ability to remain free from injury will improve Outcome: Progressing

## 2024-03-25 NOTE — Progress Notes (Signed)
 Subjective: 1 Day Post-Op Procedure(s) (LRB): ARTHROPLASTY, KNEE, TOTAL (Right) Patient reports pain as mild.   Patient seen in rounds by Dr. Melodi. Patient is well, and has had no acute complaints or problems No issues overnight. Denies chest pain, SOB, or calf pain. Papadopoulos catheter removed this AM.  We will continue therapy today  Objective: Vital signs in last 24 hours: Temp:  [97.5 F (36.4 C)-98.3 F (36.8 C)] 98.1 F (36.7 C) (07/15 0505) Pulse Rate:  [61-82] 78 (07/15 0505) Resp:  [10-18] 18 (07/15 0505) BP: (98-141)/(41-61) 119/55 (07/15 0505) SpO2:  [87 %-100 %] 87 % (07/15 0505)  Intake/Output from previous day:  Intake/Output Summary (Last 24 hours) at 03/25/2024 0843 Last data filed at 03/25/2024 0600 Gross per 24 hour  Intake 2953.71 ml  Output 3675 ml  Net -721.29 ml     Intake/Output this shift: No intake/output data recorded.  Labs: Recent Labs    03/25/24 0345  HGB 11.0*   Recent Labs    03/25/24 0345  WBC 7.3  RBC 3.71*  HCT 35.5*  PLT 142*   Recent Labs    03/25/24 0345  NA 137  K 3.8  CL 105  CO2 23  BUN 20  CREATININE 1.06*  GLUCOSE 153*  CALCIUM  9.0   No results for input(s): LABPT, INR in the last 72 hours.  Exam: General - Patient is Alert and Oriented Extremity - Neurologically intact Neurovascular intact Sensation intact distally Dorsiflexion/Plantar flexion intact Dressing - dressing C/D/I Motor Function - intact, moving foot and toes well on exam.   Past Medical History:  Diagnosis Date   Abnormal EKG    Allergy    Anxiety    PTSD   Arthritis    CHF (congestive heart failure) (HCC)    Depression    Diabetes mellitus without complication (HCC)    Dyspnea    Fatty liver    GERD (gastroesophageal reflux disease)    History of kidney stones    Hx of absence seizures    last seizure- 1978   Hyperlipidemia    Hypertension    Unspecified   IBS (irritable bowel syndrome)    Panic attacks    severe  panic attacks followed by DR jenel when happens cannot speak   Panic disorder 03/20/2019   Pneumonia    Seizures (HCC)    Reports last seizure was 30 + years ago    Sleep apnea    no CPAP worn   Stroke (HCC)    2004  slight right eye droop    Assessment/Plan: 1 Day Post-Op Procedure(s) (LRB): ARTHROPLASTY, KNEE, TOTAL (Right) Principal Problem:   OA (osteoarthritis) of knee Active Problems:   Osteoarthritis of right knee  Estimated body mass index is 31.43 kg/m as calculated from the following:   Height as of this encounter: 5' (1.524 m).   Weight as of this encounter: 73 kg. Advance diet Up with therapy D/C IV fluids   Patient's anticipated LOS is less than 2 midnights, meeting these requirements: - Younger than 91 - Lives within 1 hour of care - Has a competent adult at home to recover with post-op recover - NO history of  - Chronic pain requiring opiods  - Diabetes  - Coronary Artery Disease  - Heart failure  - Heart attack  - Stroke  - DVT/VTE  - Cardiac arrhythmia  - Respiratory Failure/COPD  - Renal failure  - Anemia  - Advanced Liver disease     DVT Prophylaxis -  Xarelto  Weight bearing as tolerated. Continue therapy.  Plan is to go Home after hospital stay. Plan for discharge later today if progresses with therapy and meeting goals. Scheduled for OPPT at [facility]. Follow-up in the office in 2 weeks.  The PDMP database was reviewed today prior to any opioid medications being prescribed to this patient.  Roxie Mess, PA-C Orthopedic Surgery [phone] 03/25/2024, 8:43 AM

## 2024-03-25 NOTE — Progress Notes (Signed)
 Physical Therapy Treatment Patient Details Name: [name] [name] MRN: [id number] DOB: 1960-11-02 Today's Date: 03/25/2024   History of Present Illness 63 yo female s/p R TKA on 03/24/24. PMH: OA, AKI, dCHF, DM, panic d/o, HTN, PTSD, sleep apnea, CVA, L TKA 02/2023    PT Comments  POD # 1 am session PT - Cognition Comments: AxO x 3 but slow to respond, slightly drunk/uncoordinated and unsteady.  Suspect pain meds with flexeril  taken this am. Assisted OOB.  General bed mobility comments: Pt required increased time and repeat VC's to complete task.  Impaired  General transfer comment: MAX VC's on proper hand placement ans safety with turns as well as proper walker to self distance.  Also assisted with a toilet transfer.  Near fall (B knee buckle) during standing peri care Therapist recovered with safety belt.  Impaired/unsteady/drunk  General Gait Details: Limiteed amb distance to and from bathroom due to gait instability and Pt's level of cognition.  Required MAX VC's on proper sequencing, proper walker to self distance as well as to increase support through B UE's.  Pt appears impaired cognition as well as coordination.  Unsteady.  Staggering. Reported to RN.  HIGH FALL RISK  Performed some TKR TE's while in recliner followed by ICE. Will see Pt again this afternoon when Spouse arrives.    If plan is discharge home, recommend the following: Help with stairs or ramp for entrance;A little help with walking and/or transfers;A little help with bathing/dressing/bathroom;Assist for transportation;Direct supervision/assist for medications management;Assistance with cooking/housework   Can travel by Training and development officer (2 wheels) ([employer])    Recommendations for Other Services       Precautions / Restrictions Precautions Precaution/Restrictions Comments: no pillow under knee Restrictions Weight Bearing Restrictions Per Provider Order: No RLE Weight  Bearing Per Provider Order: Weight bearing as tolerated     Mobility  Bed Mobility Overal bed mobility: Needs Assistance Bed Mobility: Supine to Sit     Supine to sit: Contact guard, Supervision     General bed mobility comments: Pt required increased time and repeat VC's to complete task.  Impaired    Transfers Overall transfer level: Needs assistance Equipment used: Rolling walker (2 wheels) Transfers: Sit to/from Stand Sit to Stand: Min assist           General transfer comment: MAX VC's on proper hand placement ans safety with turns as well as proper walker to self distance.  Also assisted with a toilet transfer.  Near fall (B knee buckle) during standing peri care Therapist recovered with safety belt.  Impaired/unsteady/drunk    Ambulation/Gait Ambulation/Gait assistance: Min Chemical engineer (Feet): 16 Feet Assistive device: Rolling walker (2 wheels) Gait Pattern/deviations: Step-to pattern, Decreased stance time - right Gait velocity: decreased     General Gait Details: Limiteed amb distance to and from bathroom due to gait instability and Pt's level of cognition.  Required MAX VC's on proper sequencing, proper walker to self distance as well as to increase support through B UE's.  Pt appears impaired cognition as well as coordination.  Unsteady.  Staggering. HIGH FALL RISK   Stairs             Wheelchair Mobility     Tilt Bed    Modified Rankin (Stroke Patients Only)       Balance  Communication Communication Communication: Impaired Factors Affecting Communication: Difficulty expressing self  Cognition Arousal: Alert Behavior During Therapy: Flat affect   PT - Cognitive impairments: Sequencing, Problem solving, Safety/Judgement, Attention                       PT - Cognition Comments: AxO x 3 but slow to respond, slightly drunk/uncoordinated and unsteady.   Suspect pain meds with flexirel taken this am. Following commands: Impaired Following commands impaired: Follows multi-step commands with increased time, Follows one step commands inconsistently    Cueing Cueing Techniques: Verbal cues, Tactile cues  Exercises  Total Knee Replacement TE's following HEP handout 10 reps B LE ankle pumps 05 reps towel squeezes 05 reps knee presses 05 reps heel slides  05 reps SAQ's 05 reps SLR's 05 reps ABD Educated on use of gait belt to assist with TE's Followed by ICE     General Comments        Pertinent Vitals/Pain Pain Assessment Pain Assessment: 0-10 Pain Score: 2  Pain Location: right knee Pain Descriptors / Indicators: Dull Pain Intervention(s): Monitored during session, Premedicated before session, Repositioned, Ice applied    Home Living                          Prior Function            PT Goals (current goals can now be found in the care plan section) Progress towards PT goals: Progressing toward goals    Frequency    7X/week      PT Plan      Co-evaluation              AM-PAC PT 6 Clicks Mobility   Outcome Measure  Help needed turning from your back to your side while in a flat bed without using bedrails?: A Lot Help needed moving from lying on your back to sitting on the side of a flat bed without using bedrails?: A Lot Help needed moving to and from a bed to a chair (including a wheelchair)?: A Lot Help needed standing up from a chair using your arms (e.g., wheelchair or bedside chair)?: A Lot Help needed to walk in hospital room?: A Lot Help needed climbing 3-5 steps with a railing? : Total 6 Click Score: 11    End of Session Equipment Utilized During Treatment: Gait belt Activity Tolerance: Other (comment) (impaired) Patient left: with call bell/phone within reach;in chair;with chair alarm set Nurse Communication: Mobility status PT Visit Diagnosis: Other abnormalities of gait and  mobility (R26.89);Difficulty in walking, not elsewhere classified (R26.2)     Time: 8987-8946 PT Time Calculation (min) (ACUTE ONLY): 41 min  Charges:    $Gait Training: 8-22 mins $Therapeutic Exercise: 8-22 mins $Therapeutic Activity: 8-22 mins PT General Charges $$ ACUTE PT VISIT: 1 Visit                     Katheryn Leap  PTA Acute  Rehabilitation Services Office M-F          [phone]

## 2024-03-25 NOTE — TOC Transition Note (Signed)
 Transition of Care [facility]) - Discharge Note   Patient Details  Name: PANAYIOTA LARKIN MRN: [id number] Date of Birth: 1961-03-08  [facility] CM/SW Contact:  Alfonse JONELLE Rex, RN Phone Number: 03/25/2024, 9:40 AM   Clinical Narrative:   Met with patient at bedside to review dc therapy and home equipment needs, patient confirmed she received a RW in 2024 but let a family member use it, willing to private pay for a RW, [employer] delivered RW to bedside. No TOC needs.     Final next level of care: OP Rehab     Patient Goals and CMS Choice Patient states their goals for this hospitalization and ongoing recovery are:: return home          Discharge Placement                       Discharge Plan and Services Additional resources added to the After Visit Summary for                                       Social Drivers of Health (SDOH) Interventions SDOH Screenings   Food Insecurity: No Food Insecurity (03/24/2024)  Housing: Low Risk  (03/24/2024)  Transportation Needs: No Transportation Needs (03/24/2024)  Utilities: Not At Risk (03/24/2024)  Depression (PHQ2-9): Medium Risk (11/07/2021)  Tobacco Use: Low Risk  (03/24/2024)     Readmission Risk Interventions     No data to display

## 2024-03-26 DIAGNOSIS — M1711 Unilateral primary osteoarthritis, right knee: Secondary | ICD-10-CM | POA: Diagnosis not present

## 2024-03-26 LAB — CBC
HCT: 33.2 % — ABNORMAL LOW (ref 36.0–46.0)
Hemoglobin: 10.7 g/dL — ABNORMAL LOW (ref 12.0–15.0)
MCH: 30.2 pg (ref 26.0–34.0)
MCHC: 32.2 g/dL (ref 30.0–36.0)
MCV: 93.8 fL (ref 80.0–100.0)
Platelets: 167 K/uL (ref 150–400)
RBC: 3.54 MIL/uL — ABNORMAL LOW (ref 3.87–5.11)
RDW: 12.9 % (ref 11.5–15.5)
WBC: 8.9 K/uL (ref 4.0–10.5)
nRBC: 0 % (ref 0.0–0.2)

## 2024-03-26 LAB — GLUCOSE, CAPILLARY
Glucose-Capillary: 104 mg/dL — ABNORMAL HIGH (ref 70–99)
Glucose-Capillary: 188 mg/dL — ABNORMAL HIGH (ref 70–99)

## 2024-03-26 NOTE — Progress Notes (Signed)
   Subjective: 2 Days Post-Op Procedure(s) (LRB): ARTHROPLASTY, KNEE, TOTAL (Right) Patient seen in rounds for Dr. Melodi. No issues overnight other than pain. Patient reports pain as severe even at rest. Pain radiates down the lower extremity. She states any movement is unbearable. Pain medications reportedly making her nauseous but has not vomited. Denies chest pain or SOB.  Objective: Vital signs in last 24 hours: Temp:  [97.6 F (36.4 C)-98.1 F (36.7 C)] 97.7 F (36.5 C) (07/16 0626) Pulse Rate:  [70-78] 77 (07/16 0626) Resp:  [16-18] 18 (07/16 0626) BP: (92-141)/(53-77) 141/59 (07/16 0626) SpO2:  [91 %-96 %] 93 % (07/16 0626)  Intake/Output from previous day:  Intake/Output Summary (Last 24 hours) at 03/26/2024 0832 Last data filed at 03/25/2024 1400 Gross per 24 hour  Intake 1010.82 ml  Output 900 ml  Net 110.82 ml    Intake/Output this shift: No intake/output data recorded.  Labs: Recent Labs    03/25/24 0345 03/26/24 0349  HGB 11.0* 10.7*   Recent Labs    03/25/24 0345 03/26/24 0349  WBC 7.3 8.9  RBC 3.71* 3.54*  HCT 35.5* 33.2*  PLT 142* 167   Recent Labs    03/25/24 0345  NA 137  K 3.8  CL 105  CO2 23  BUN 20  CREATININE 1.06*  GLUCOSE 153*  CALCIUM  9.0   No results for input(s): LABPT, INR in the last 72 hours.  Exam: General - Patient is Alert and Oriented Extremity - Neurologically intact Neurovascular intact Sensation intact distally Dorsiflexion/Plantar flexion intact Dressing/Incision - clean, dry, no drainage Motor Function - intact, moving foot and toes well on exam.  Past Medical History:  Diagnosis Date   Abnormal EKG    Allergy    Anxiety    PTSD   Arthritis    CHF (congestive heart failure) (HCC)    Depression    Diabetes mellitus without complication (HCC)    Dyspnea    Fatty liver    GERD (gastroesophageal reflux disease)    History of kidney stones    Hx of absence seizures    last seizure- 1978    Hyperlipidemia    Hypertension    Unspecified   IBS (irritable bowel syndrome)    Panic attacks    severe panic attacks followed by DR jenel when happens cannot speak   Panic disorder 03/20/2019   Pneumonia    Seizures (HCC)    Reports last seizure was 30 + years ago    Sleep apnea    no CPAP worn   Stroke (HCC)    2004  slight right eye droop    Assessment/Plan: 2 Days Post-Op Procedure(s) (LRB): ARTHROPLASTY, KNEE, TOTAL (Right) Principal Problem:   OA (osteoarthritis) of knee Active Problems:   Osteoarthritis of right knee  Estimated body mass index is 31.43 kg/m as calculated from the following:   Height as of this encounter: 5' (1.524 m).   Weight as of this encounter: 73 kg.  DVT Prophylaxis - Xarelto  Weight-bearing as tolerated.  Discussed with patient that pain increased yesterday and today as nerve block has worn off. Continue physical therapy. Stressed importance of movement for the knee. If meeting patient goals, can discharge home today.  R. Zelda Kobs, PA-C Orthopedic Surgery 03/26/2024, 8:32 AM

## 2024-03-26 NOTE — Plan of Care (Signed)
  Problem: Activity: Goal: Risk for activity intolerance will decrease Outcome: Progressing   Problem: Safety: Goal: Ability to remain free from injury will improve Outcome: Progressing   Problem: Pain Managment: Goal: General experience of comfort will improve and/or be controlled Outcome: Progressing

## 2024-03-26 NOTE — Progress Notes (Signed)
 Physical Therapy Treatment Patient Details Name: [name] [name] MRN: [id number] DOB: 18-Dec-1960 Today's Date: 03/26/2024   History of Present Illness 63 yo female s/p R TKA on 03/24/24. PMH: OA, AKI, dCHF, DM, panic d/o, HTN, PTSD, sleep apnea, CVA, L TKA 02/2023    PT Comments  POD # 2 am session AxO x 3 sweet Lady with VERY supportive Spouse.  Pt reports a bad night with increased pain and little sleep. Had Spouse assist Pt OOB to recliner to eat breakfast.  Advised to use safety belt and instructed on proper transfer assist with turn completion as well as leg strap to support R LE during sit to stand and stand to sit. Instructed Spouse on proper placement of black ICE wrap as well as freq. Will see Pt a little later after breakfast for increased mobility and stairs.       If plan is discharge home, recommend the following: Help with stairs or ramp for entrance;A little help with walking and/or transfers;A little help with bathing/dressing/bathroom;Assist for transportation;Direct supervision/assist for medications management;Assistance with cooking/housework   Can travel by private Scientist, research (medical) walker (2 wheels)    Recommendations for Other Services       Precautions / Restrictions Precautions Precautions: Knee;Fall Precaution/Restrictions Comments: no pillow under knee and advised to wear KI for stairs as well as amb until R knee no longer buckles. Required Braces or Orthoses: Knee Immobilizer - Right Restrictions Weight Bearing Restrictions Per Provider Order: No RLE Weight Bearing Per Provider Order: Weight bearing as tolerated     Mobility  Bed Mobility Overal bed mobility: Needs Assistance Bed Mobility: Supine to Sit     Supine to sit: Contact guard, Supervision     General bed mobility comments: Had Spouse assist Pt OOB using belt loop to support R LE as pt self scoots to EOB.    Transfers Overall transfer level: Needs  assistance Equipment used: Rolling walker (2 wheels) Transfers: Sit to/from Stand Sit to Stand: Supervision, Contact guard assist           General transfer comment: Had Spouse assist Pt OOB to recliner using safety belt.    Ambulation/Gait  Tilt Bed    Modified Rankin (Stroke Patients Only)       Balance                                            Communication Communication Communication: Impaired  Cognition Arousal: Alert Behavior During Therapy: WFL for tasks assessed/performed   PT - Cognitive impairments: No apparent impairments                       PT - Cognition Comments: AxO x 3 sweet Lady with VERY supportive Spouse.  Pt reports a bad night with increased pain and little sleep. Following commands: Impaired Following commands impaired: Follows multi-step commands with increased time, Follows one step commands inconsistently    Cueing Cueing Techniques: Verbal cues, Tactile cues  Exercises      General Comments        Pertinent Vitals/Pain Pain Assessment Pain Assessment: 0-10 Pain Score: 5  Pain Location: right knee Pain Descriptors / Indicators: Grimacing, Discomfort, Operative site guarding Pain Intervention(s): Monitored during session, Premedicated before session, Repositioned, Ice applied    Home Living  Prior Function            PT Goals (current goals can now be found in the care plan section) Progress towards PT goals: Progressing toward goals    Frequency    7X/week      PT Plan      Co-evaluation              AM-PAC PT 6 Clicks Mobility   Outcome Measure  Help needed turning from your back to your side while in a flat bed without using bedrails?: A Little Help needed moving from lying on your back to sitting on the side of a flat bed without using bedrails?: A Little Help needed moving to and from a bed to a chair (including a wheelchair)?: A  Little Help needed standing up from a chair using your arms (e.g., wheelchair or bedside chair)?: A Little Help needed to walk in hospital room?: A Little Help needed climbing 3-5 steps with a railing? : A Little 6 Click Score: 18    End of Session Equipment Utilized During Treatment: Gait belt Activity Tolerance: Patient tolerated treatment well Patient left: with call bell/phone within reach;in chair;with chair alarm set Nurse Communication: Mobility status PT Visit Diagnosis: Other abnormalities of gait and mobility (R26.89);Difficulty in walking, not elsewhere classified (R26.2)     Time: 1002-1020 PT Time Calculation (min) (ACUTE ONLY): 18 min  Charges:     $Therapeutic Activity: 8-22 mins PT General Charges $$ ACUTE PT VISIT: 1 Visit                     Katheryn Leap  PTA Acute  Rehabilitation Services Office M-F          [phone]

## 2024-03-26 NOTE — Progress Notes (Signed)
Discharge package printed and instructions given to pt. Pt Verbalizes understanding.

## 2024-03-26 NOTE — Progress Notes (Signed)
 Physical Therapy Treatment Patient Details Name: [name] [name] MRN: [id number] DOB: April 28, 1961 Today's Date: 03/26/2024   History of Present Illness 63 yo female s/p R TKA on 03/24/24. PMH: OA, AKI, dCHF, DM, panic d/o, HTN, PTSD, sleep apnea, CVA, L TKA 02/2023    PT Comments  POD # 2 second session with Spouse for Family Training. AxO x 3 sweet Lady with VERY supportive Spouse.  Pt reports a bad night with increased pain and little sleep.  Pt OOB in recliner eating some breakfast feeling better after more pain meds.  Pt is less groggy and wants to go home today.  So did A LOT FAMILY TRAINING with SPOUSE. Advised Spouse to always use safety belt and be hands on with all activity of amb/toileting/stairs.  Safety belt issued to take home. Advised Spouse to have Pt wear her KI for all amb/stairs for a couple more days until Pt feels her knee will NOT buckle.  Had Spouse practicing proper donning/doffing KI.   General Gait Details: With KI ON and safety belt ON, had Spouse hands on assist with amb Pt in hallway a functional distance of 35 feet.  Pt required less VC's on proper sequencing and walker placement.  Pt reports pain 5/10.  VC's to increase B UE support on walker. General stair comments: Pt has 3 steps and NO rail to enter her home.  Had Spouse hands on assist Pt up backward with walker as well as securing Pt's balance with safety belt.  Practiced twice.  Pt and Spouse able to properly perform without Therapist intervention during second attempt. Had Spouse apply ICE wrap and instructed on proper placement as well as freq. Issued HEP handout yesterday but reviewed.   Pt has an OP PT appointment tomorrow at 2:30.   Pt has met her mobility goals to D/C to home with full Spouse support.  Reported to RN.      If plan is discharge home, recommend the following: Help with stairs or ramp for entrance;A little help with walking and/or transfers;A little help with  bathing/dressing/bathroom;Assist for transportation;Direct supervision/assist for medications management;Assistance with cooking/housework   Can travel by private Scientist, research (medical) walker (2 wheels)    Recommendations for Other Services       Precautions / Restrictions Precautions Precautions: Knee;Fall Precaution/Restrictions Comments: no pillow under knee and advised to wear KI for stairs as well as amb until R knee no longer buckles. Required Braces or Orthoses: Knee Immobilizer - Right Restrictions Weight Bearing Restrictions Per Provider Order: No RLE Weight Bearing Per Provider Order: Weight bearing as tolerated     Mobility  Bed Mobility  Pt OOB in recliner   Transfers Overall transfer level: Needs assistance Equipment used: Rolling walker (2 wheels) Transfers: Sit to/from Stand Sit to Stand: Supervision, Contact guard assist           General transfer comment: Had Spouse hands on assist Pt using safety belt with all transfers.  Educated on safe handling and current assist level.  Advised to wear KI next couple of days until she feels her knee will NOT buckle.  .    Ambulation/Gait Ambulation/Gait assistance: Supervision, Contact guard assist Gait Distance (Feet): 35 Feet Assistive device: Rolling walker (2 wheels) Gait Pattern/deviations: Step-to pattern, Decreased stance time - right Gait velocity: decreased     General Gait Details: With KI ON and safety belt ON, had Spouse hands on assist with amb Pt in  hallway a functional distance of 35 feet.  Pt required less VC's on proper sequencing and walker placement.  Pt reports pain 5/10.  VC's to increase B UE support on walker.   Stairs Stairs: Yes Stairs assistance: Min assist, Mod assist Stair Management: No rails, Step to pattern, Backwards Number of Stairs: 3 General stair comments: Pt has 3 steps and NO rail to enter her home.  Had Spouse hands on assist Pt up  backward with walker as well as securing Pt's balance with safety belt.  Practiced twice.  Pt and Spouse able to properly perform without Therapist intervention during second attempt.   Wheelchair Mobility     Tilt Bed    Modified Rankin (Stroke Patients Only)       Balance                                            Communication Communication Communication: Impaired  Cognition Arousal: Alert Behavior During Therapy: WFL for tasks assessed/performed   PT - Cognitive impairments: No apparent impairments                       PT - Cognition Comments: AxO x 3 sweet Lady with VERY supportive Spouse.  Pt reports a bad night with increased pain and little sleep. Following commands: Impaired Following commands impaired: Follows multi-step commands with increased time, Follows one step commands inconsistently    Cueing Cueing Techniques: Verbal cues, Tactile cues  Exercises      General Comments        Pertinent Vitals/Pain Pain Assessment Pain Assessment: 0-10 Pain Score: 5  Pain Location: right knee Pain Descriptors / Indicators: Grimacing, Discomfort, Operative site guarding Pain Intervention(s): Monitored during session, Premedicated before session, Repositioned, Ice applied    Home Living                          Prior Function            PT Goals (current goals can now be found in the care plan section) Progress towards PT goals: Progressing toward goals    Frequency    7X/week      PT Plan      Co-evaluation              AM-PAC PT 6 Clicks Mobility   Outcome Measure  Help needed turning from your back to your side while in a flat bed without using bedrails?: A Little Help needed moving from lying on your back to sitting on the side of a flat bed without using bedrails?: A Little Help needed moving to and from a bed to a chair (including a wheelchair)?: A Little Help needed standing up from a  chair using your arms (e.g., wheelchair or bedside chair)?: A Little Help needed to walk in hospital room?: A Little Help needed climbing 3-5 steps with a railing? : A Little 6 Click Score: 18    End of Session Equipment Utilized During Treatment: Gait belt Activity Tolerance: Patient tolerated treatment well Patient left: with call bell/phone within reach;in chair;with chair alarm set Nurse Communication: Mobility status PT Visit Diagnosis: Other abnormalities of gait and mobility (R26.89);Difficulty in walking, not elsewhere classified (R26.2)     Time: 8875-8840 PT Time Calculation (min) (ACUTE ONLY): 35 min  Charges:    $Gait Training:  8-22 mins $Therapeutic Activity: 8-22 mins PT General Charges $$ ACUTE PT VISIT: 1 Visit                     Katheryn Leap  PTA Acute  Rehabilitation Services Office M-F          [phone]

## 2024-03-28 DIAGNOSIS — Z471 Aftercare following joint replacement surgery: Secondary | ICD-10-CM | POA: Diagnosis not present

## 2024-03-28 DIAGNOSIS — M6281 Muscle weakness (generalized): Secondary | ICD-10-CM | POA: Diagnosis not present

## 2024-03-28 DIAGNOSIS — M25561 Pain in right knee: Secondary | ICD-10-CM | POA: Diagnosis not present

## 2024-03-31 DIAGNOSIS — Z471 Aftercare following joint replacement surgery: Secondary | ICD-10-CM | POA: Diagnosis not present

## 2024-03-31 DIAGNOSIS — M6281 Muscle weakness (generalized): Secondary | ICD-10-CM | POA: Diagnosis not present

## 2024-03-31 DIAGNOSIS — M25561 Pain in right knee: Secondary | ICD-10-CM | POA: Diagnosis not present

## 2024-03-31 NOTE — Discharge Summary (Signed)
 Physician Discharge Summary   Patient ID: [name] [name] MRN: [id number] DOB/AGE: 03-07-1961 63 y.o.  Admit date: 03/24/2024 Discharge date: 03/26/2024  Primary Diagnosis: Osteoarthritis right knee   Admission Diagnoses:  Past Medical History:  Diagnosis Date   Abnormal EKG    Allergy    Anxiety    PTSD   Arthritis    CHF (congestive heart failure) (HCC)    Depression    Diabetes mellitus without complication (HCC)    Dyspnea    Fatty liver    GERD (gastroesophageal reflux disease)    History of kidney stones    Hx of absence seizures    last seizure- 1978   Hyperlipidemia    Hypertension    Unspecified   IBS (irritable bowel syndrome)    Panic attacks    severe panic attacks followed by DR jenel when happens cannot speak   Panic disorder 03/20/2019   Pneumonia    Seizures (HCC)    Reports last seizure was 30 + years ago    Sleep apnea    no CPAP worn   Stroke (HCC)    2004  slight right eye droop   Discharge Diagnoses:   Principal Problem:   OA (osteoarthritis) of knee Active Problems:   Osteoarthritis of right knee  Estimated body mass index is 31.43 kg/m as calculated from the following:   Height as of this encounter: 5' (1.524 m).   Weight as of this encounter: 73 kg.  Procedure:  Procedure(s) (LRB): ARTHROPLASTY, KNEE, TOTAL (Right)   Consults: None  HPI: [name] [name] is a 63 y.o. year old female with end stage OA of her right knee with progressively worsening pain and dysfunction. She has constant pain, with activity and at rest and significant functional deficits with difficulties even with ADLs. She has had extensive non-op management including analgesics, injections of cortisone and viscosupplements, and home exercise program, but remains in significant pain with significant dysfunction.Radiographs show bone on bone arthritis medial and patellofemoral. She presents now for right Total Knee Arthroplasty.  Laboratory Data: Admission on 03/24/2024,  Discharged on 03/26/2024  Component Date Value Ref Range Status   Glucose-Capillary 03/24/2024 159 (H)  70 - 99 mg/dL Final   Glucose reference range applies only to samples taken after fasting for at least 8 hours.   Glucose-Capillary 03/24/2024 101 (H)  70 - 99 mg/dL Final   Glucose reference range applies only to samples taken after fasting for at least 8 hours.   Glucose-Capillary 03/24/2024 223 (H)  70 - 99 mg/dL Final   Glucose reference range applies only to samples taken after fasting for at least 8 hours.   WBC 03/25/2024 7.3  4.0 - 10.5 K/uL Final   RBC 03/25/2024 3.71 (L)  3.87 - 5.11 MIL/uL Final   Hemoglobin 03/25/2024 11.0 (L)  12.0 - 15.0 g/dL Final   HCT 92/84/7974 35.5 (L)  36.0 - 46.0 % Final   MCV 03/25/2024 95.7  80.0 - 100.0 fL Final   MCH 03/25/2024 29.6  26.0 - 34.0 pg Final   MCHC 03/25/2024 31.0  30.0 - 36.0 g/dL Final   RDW 92/84/7974 12.5  11.5 - 15.5 % Final   Platelets 03/25/2024 142 (L)  150 - 400 K/uL Final   nRBC 03/25/2024 0.0  0.0 - 0.2 % Final   Performed at [facility], [address] [place], [place] [address]   Sodium 03/25/2024 137  135 - 145 mmol/L Final   Potassium 03/25/2024 3.8  3.5 - 5.1 mmol/L Final   Chloride 03/25/2024 105  98 - 111 mmol/L Final   CO2 03/25/2024 23  22 - 32 mmol/L Final   Glucose, Bld 03/25/2024 153 (H)  70 - 99 mg/dL Final   Glucose reference range applies only to samples taken after fasting for at least 8 hours.   BUN 03/25/2024 20  8 - 23 mg/dL Final   Creatinine, Ser 03/25/2024 1.06 (H)  0.44 - 1.00 mg/dL Final   Calcium  03/25/2024 9.0  8.9 - 10.3 mg/dL Final   GFR, Estimated 03/25/2024 59 (L)  >60 mL/min Final   Comment: (NOTE) Calculated using the CKD-EPI Creatinine Equation (2021)    Anion gap 03/25/2024 9  5 - 15 Final   Performed at [facility], [address] [place], [place] [address]   Glucose-Capillary 03/24/2024 228 (H)  70 - 99 mg/dL Final   Glucose reference  range applies only to samples taken after fasting for at least 8 hours.   Glucose-Capillary 03/25/2024 158 (H)  70 - 99 mg/dL Final   Glucose reference range applies only to samples taken after fasting for at least 8 hours.   Glucose-Capillary 03/25/2024 159 (H)  70 - 99 mg/dL Final   Glucose reference range applies only to samples taken after fasting for at least 8 hours.   WBC 03/26/2024 8.9  4.0 - 10.5 K/uL Final   RBC 03/26/2024 3.54 (L)  3.87 - 5.11 MIL/uL Final   Hemoglobin 03/26/2024 10.7 (L)  12.0 - 15.0 g/dL Final   HCT 92/83/7974 33.2 (L)  36.0 - 46.0 % Final   MCV 03/26/2024 93.8  80.0 - 100.0 fL Final   MCH 03/26/2024 30.2  26.0 - 34.0 pg Final   MCHC 03/26/2024 32.2  30.0 - 36.0 g/dL Final   RDW 92/83/7974 12.9  11.5 - 15.5 % Final   Platelets 03/26/2024 167  150 - 400 K/uL Final   nRBC 03/26/2024 0.0  0.0 - 0.2 % Final   Performed at [facility], [address] [place], [place] [address]   Glucose-Capillary 03/25/2024 205 (H)  70 - 99 mg/dL Final   Glucose reference range applies only to samples taken after fasting for at least 8 hours.   Glucose-Capillary 03/25/2024 144 (H)  70 - 99 mg/dL Final   Glucose reference range applies only to samples taken after fasting for at least 8 hours.   Glucose-Capillary 03/26/2024 104 (H)  70 - 99 mg/dL Final   Glucose reference range applies only to samples taken after fasting for at least 8 hours.   Glucose-Capillary 03/26/2024 188 (H)  70 - 99 mg/dL Final   Glucose reference range applies only to samples taken after fasting for at least 8 hours.  Hospital Outpatient Visit on 03/11/2024  Component Date Value Ref Range Status   MRSA, PCR 03/11/2024 NEGATIVE  NEGATIVE Final   Staphylococcus aureus 03/11/2024 POSITIVE (A)  NEGATIVE Final   Comment: (NOTE) The Xpert SA Assay (FDA approved for NASAL specimens in patients 79 years of age and older), is one component of a comprehensive surveillance program. It is not  intended to diagnose infection nor to guide or monitor treatment. Performed at [facility], [address], [place], [place] [address]    Sodium 03/11/2024 137  135 - 145 mmol/L Final   Potassium 03/11/2024 3.8  3.5 - 5.1 mmol/L Final   Chloride 03/11/2024 105  98 - 111 mmol/L Final   CO2 03/11/2024 23  22 - 32 mmol/L Final   Glucose, Bld 03/11/2024 224 (H)  70 - 99 mg/dL Final   Glucose reference range applies only to samples taken after fasting for at least 8 hours.   BUN 03/11/2024 19  8 - 23 mg/dL Final   Creatinine, Ser 03/11/2024 0.96  0.44 - 1.00 mg/dL Final   Calcium  03/11/2024 9.3  8.9 - 10.3 mg/dL Final   GFR, Estimated 03/11/2024 >60  >60 mL/min Final   Comment: (NOTE) Calculated using the CKD-EPI Creatinine Equation (2021)    Anion gap 03/11/2024 9  5 - 15 Final   Performed at [facility], [address] [place], [place] [address]   WBC 03/11/2024 4.7  4.0 - 10.5 K/uL Final   RBC 03/11/2024 4.16  3.87 - 5.11 MIL/uL Final   Hemoglobin 03/11/2024 12.6  12.0 - 15.0 g/dL Final   HCT 92/98/7974 39.7  36.0 - 46.0 % Final   MCV 03/11/2024 95.4  80.0 - 100.0 fL Final   MCH 03/11/2024 30.3  26.0 - 34.0 pg Final   MCHC 03/11/2024 31.7  30.0 - 36.0 g/dL Final   RDW 92/98/7974 12.6  11.5 - 15.5 % Final   Platelets 03/11/2024 199  150 - 400 K/uL Final   nRBC 03/11/2024 0.0  0.0 - 0.2 % Final   Performed at [facility], [address], [place], [place] [address]   Glucose-Capillary 03/11/2024 232 (H)  70 - 99 mg/dL Final   Glucose reference range applies only to samples taken after fasting for at least 8 hours.  Hospital Outpatient Visit on 02/01/2024  Component Date Value Ref Range Status   Hgb A1c MFr Bld 02/01/2024 6.6 (H)  4.8 - 5.6 % Final   Comment: (NOTE) Pre diabetes:          5.7%-6.4%  Diabetes:              >6.4%  Glycemic control for   <7.0% adults with diabetes    Mean Plasma Glucose 02/01/2024 142.72   mg/dL Final   Performed at [facility], [address] [place], [place] [address]   MRSA, PCR 02/01/2024 NEGATIVE  NEGATIVE Final   Staphylococcus aureus 02/01/2024 POSITIVE (A)  NEGATIVE Final   Comment: (NOTE) The Xpert SA Assay (FDA approved for NASAL specimens in patients 32 years of age and older), is one component of a comprehensive surveillance program. It is not intended to diagnose infection nor to guide or monitor treatment. Performed at [facility], [address] [place], [place] [address]    Sodium 02/01/2024 139  135 - 145 mmol/L Final   Potassium 02/01/2024 3.5  3.5 - 5.1 mmol/L Final   Chloride 02/01/2024 108  98 - 111 mmol/L Final   CO2 02/01/2024 22  22 - 32 mmol/L Final   Glucose, Bld 02/01/2024 188 (H)  70 - 99 mg/dL Final   Glucose reference range applies only to samples taken after fasting for at least 8 hours.   BUN 02/01/2024 15  8 - 23 mg/dL Final   Creatinine, Ser 02/01/2024 0.99  0.44 - 1.00 mg/dL Final   Calcium  02/01/2024 9.5  8.9 - 10.3 mg/dL Final   GFR, Estimated 02/01/2024 >60  >60 mL/min Final   Comment: (NOTE) Calculated using the CKD-EPI Creatinine Equation (2021)    Anion gap 02/01/2024 9  5 - 15 Final   Performed at [facility], [address] [place], [place] [address]  WBC 02/01/2024 4.7  4.0 - 10.5 K/uL Final   RBC 02/01/2024 4.06  3.87 - 5.11 MIL/uL Final   Hemoglobin 02/01/2024 12.4  12.0 - 15.0 g/dL Final   HCT 94/76/7974 39.2  36.0 - 46.0 % Final   MCV 02/01/2024 96.6  80.0 - 100.0 fL Final   MCH 02/01/2024 30.5  26.0 - 34.0 pg Final   MCHC 02/01/2024 31.6  30.0 - 36.0 g/dL Final   RDW 94/76/7974 12.6  11.5 - 15.5 % Final   Platelets 02/01/2024 152  150 - 400 K/uL Final   nRBC 02/01/2024 0.0  0.0 - 0.2 % Final   Performed at [facility], [address], [place], [place] [address]   Glucose-Capillary 02/01/2024 190 (H)  70 - 99 mg/dL Final   Glucose reference range  applies only to samples taken after fasting for at least 8 hours.  Procedure visit on 01/31/2024  Component Date Value Ref Range Status   SURGICAL PATHOLOGY 01/31/2024    Final-Edited                   Value:SURGICAL PATHOLOGY [facility] [address] [place], [place] [address] Telephone [phone] or [phone] Fax [phone]  REPORT OF SURGICAL PATHOLOGY   Accession #: [id number] Patient Name: LAKEYIA, SURBER Visit # : [id number]  MRN: [id number] Physician: Albertus Gordy EMERSON Carolyne 05-Jan-1961 (Age: 89) Gender: F Collected Date: 01/31/2024 Received Date: 02/01/2024  FINAL DIAGNOSIS       1. Surgical [P], colon, transverse, polyp (2) :       TUBULAR ADENOMA.      HYPERPLASTIC POLYP.      NEGATIVE FOR HIGH-GRADE DYSPLASIA.       DATE SIGNED OUT: 02/05/2024 ELECTRONIC SIGNATURE : Pepper Dutton Md, Pathologist, Electronic Signature  MICROSCOPIC DESCRIPTION  CASE COMMENTS STAINS USED IN DIAGNOSIS: H&E    CLINICAL HISTORY  SPECIMEN(S) OBTAINED 1. Surgical [P], Colon, Transverse, Polyp (2)  SPECIMEN COMMENTS: 1. Hx of colonic polyps; benign neoplasm of transverse colon SPECIMEN CLINICAL INFORMATION: 1. R/O adenoma                             Gross Description 1. Received in formalin are tan, soft tissue fragments that are submitted in toto. Number: 2, Size: 1.2 cm smallest to 0.6 cm largest, (1B) ( MK )        Report signed out from the following location(s) [place] [address] [place], [place] [address] CLIA #: [id number]  [facility] [address] [place], [place] [address] CLIA #: [id number]      X-Rays:No results found.  EKG: Orders placed or performed in visit on 02/08/24   EKG 12-Lead     Hospital Course: [name] [name] is a 63 y.o. who was admitted to [facility]. They were brought to the operating room on 03/24/2024 and underwent  Procedure(s): ARTHROPLASTY, KNEE, TOTAL.  Patient tolerated the procedure well and was later transferred to the recovery room and then to the orthopaedic floor for postoperative care. They were given PO and IV analgesics for pain control following their surgery. They were given 24 hours of postoperative antibiotics of  Anti-infectives (From admission, onward)    Start     Dose/Rate Route Frequency Ordered Stop   03/24/24 1430  ceFAZolin  (ANCEF ) IVPB 2g/100 mL premix        2 g  200 mL/hr over 30 Minutes Intravenous Every 6 hours 03/24/24 1135 03/24/24 2239   03/24/24 0615  ceFAZolin  (ANCEF ) IVPB 2g/100 mL premix        2 g 200 mL/hr over 30 Minutes Intravenous On call to O.R. 03/24/24 9386 03/24/24 9176      and started on DVT prophylaxis in the form of Xarelto .   PT and OT were ordered for total joint protocol. Discharge planning consulted to help with post-op disposition and equipment needs. Patient had a fair night on the evening of surgery. They started to get up OOB with physical therapy on POD #0. Continued to work with physical therapy into POD #2. Patient was seen during rounds on day two and was ready to go home pending progress with physical therapy. Patient worked with physical therapy for a total of 5 sessions and was meeting their goals. Dressing was changed and the incision was C/D/I.  They were discharged home later that day in stable condition.  Diet: Regular diet Activity: WBAT Follow-up: in 2 weeks Disposition: Home Discharged Condition: stable   Discharge Instructions     Call MD / Call 911   Complete by: As directed    If you experience chest pain or shortness of breath, CALL 911 and be transported to the hospital emergency room.  If you develope a fever above 101 F, pus (white drainage) or increased drainage or redness at the wound, or calf pain, call your surgeon's office.   Change dressing   Complete by: As directed    You may remove the bulky bandage (ACE wrap and  gauze) two days after surgery. You will have an adhesive waterproof bandage underneath. Leave this in place until your first follow-up appointment.   Constipation Prevention   Complete by: As directed    Drink plenty of fluids.  Prune juice may be helpful.  You may use a stool softener, such as Colace (over the counter) 100 mg twice a day.  Use MiraLax  (over the counter) for constipation as needed.   Diet - low sodium heart healthy   Complete by: As directed    Do not put a pillow under the knee. Place it under the heel.   Complete by: As directed    Driving restrictions   Complete by: As directed    No driving for two weeks   Post-operative opioid taper instructions:   Complete by: As directed    POST-OPERATIVE OPIOID TAPER INSTRUCTIONS: It is important to wean off of your opioid medication as soon as possible. If you do not need pain medication after your surgery it is ok to stop day one. Opioids include: Codeine, Hydrocodone (Norco, Vicodin), Oxycodone (Percocet, oxycontin ) and hydromorphone  amongst others.  Long term and even short term use of opiods can cause: Increased pain response Dependence Constipation Depression Respiratory depression And more.  Withdrawal symptoms can include Flu like symptoms Nausea, vomiting And more Techniques to manage these symptoms Hydrate well Eat regular healthy meals Stay active Use relaxation techniques(deep breathing, meditating, yoga) Do Not substitute Alcohol to help with tapering If you have been on opioids for less than two weeks and do not have pain than it is ok to stop all together.  Plan to wean off of opioids This plan should start within one week post op of your joint replacement. Maintain the same interval or time between taking each dose and first decrease the dose.  Cut the total daily intake of opioids by one tablet each day Next start to  increase the time between doses. The last dose that should be eliminated is the evening  dose.      TED hose   Complete by: As directed    Use stockings (TED hose) for three weeks on both leg(s).  You may remove them at night for sleeping.   Weight bearing as tolerated   Complete by: As directed       Allergies as of 03/26/2024       Reactions   Azithromycin    thrush   Doxycycline Rash   Erythromycin Rash   Olmesartan Dermatitis   Prednisone Other (See Comments)   Sweating        Medication List     STOP taking these medications    aspirin  EC 81 MG tablet       TAKE these medications    acetaminophen  500 MG tablet Commonly known as: TYLENOL  Take 500-1,000 mg by mouth every 6 (six) hours as needed for headache.   ALPRAZolam  1 MG tablet Commonly known as: XANAX  Take 1 mg by mouth 3 (three) times daily as needed for anxiety.   atorvastatin  80 MG tablet Commonly known as: LIPITOR TAKE ONE TABLET BY MOUTH EVERY DAY   Betasept  Surgical Scrub 4 % external liquid Generic drug: chlorhexidine  Apply 15 mLs (1 Application total) topically as directed for 30 doses. Use as directed daily for 5 days every other week for 6 weeks.   buPROPion  150 MG 24 hr tablet Commonly known as: WELLBUTRIN  XL Take 150 mg by mouth every morning.   citalopram  40 MG tablet Commonly known as: CELEXA  Take 40 mg by mouth daily.   cyclobenzaprine  10 MG tablet Commonly known as: FLEXERIL  Take 1 tablet (10 mg total) by mouth 3 (three) times daily as needed for muscle spasms.   ezetimibe  10 MG tablet Commonly known as: ZETIA  Take 1 tablet (10 mg total) by mouth daily.   Fenofibric Acid 135 MG Cpdr Take 135 mg by mouth at bedtime.   furosemide  20 MG tablet Commonly known as: LASIX  Twice daily as needed if weight >3 Lbs   gabapentin  400 MG capsule Commonly known as: NEURONTIN  Take 400 mg by mouth 2 (two) times daily.   glimepiride  4 MG tablet Commonly known as: AMARYL  Take 4 mg by mouth 2 (two) times daily.   HYDROmorphone  2 MG tablet Commonly known as:  DILAUDID  Take 1-2 tablets (2-4 mg total) by mouth every 6 (six) hours as needed for severe pain (pain score 7-10).   Jardiance  10 MG Tabs tablet Generic drug: empagliflozin  Take 10 mg by mouth daily.   Lantus  SoloStar 100 UNIT/ML Solostar Pen Generic drug: insulin  glargine Inject 73 Units into the skin at bedtime.   metoprolol  succinate 25 MG 24 hr tablet Commonly known as: TOPROL -XL TAKE 1 TABLET BY MOUTH DAILY.TAKE WITH OR IMMEDIATELY FOLLOWING A MEAL   mupirocin  ointment 2 % Commonly known as: BACTROBAN  Place 1 Application into the nose 2 (two) times daily for 60 doses. Use as directed 2 times daily for 5 days every other week for 6 weeks.   omeprazole 20 MG capsule Commonly known as: PRILOSEC Take 20 mg by mouth at bedtime.   ondansetron  4 MG tablet Commonly known as: ZOFRAN  Take 1 tablet (4 mg total) by mouth every 6 (six) hours as needed for nausea.   Ozempic (2 MG/DOSE) 8 MG/3ML Sopn Generic drug: Semaglutide (2 MG/DOSE) Inject 2 mLs into the skin every Sunday.   potassium chloride  SA 20 MEQ tablet Commonly known  as: KLOR-CON  M Take 20 mEq by mouth daily as needed (With Furosemide ).   spironolactone  50 MG tablet Commonly known as: ALDACTONE  TAKE ONE TABLET BY MOUTH EVERY DAY IN THE MORNING   verapamil  120 MG CR tablet Commonly known as: CALAN -SR Take 120 mg by mouth at bedtime.   Xarelto  10 MG Tabs tablet Generic drug: rivaroxaban  Take 1 tablet (10 mg total) by mouth daily with breakfast for 20 days. Then resume one 81 mg aspirin  once a day               Discharge Care Instructions  (From admission, onward)           Start     Ordered   03/25/24 0000  Weight bearing as tolerated        03/25/24 0846   03/25/24 0000  Change dressing       Comments: You may remove the bulky bandage (ACE wrap and gauze) two days after surgery. You will have an adhesive waterproof bandage underneath. Leave this in place until your first follow-up appointment.    03/25/24 0846            Follow-up Information     Melodi Lerner, MD. Schedule an appointment as soon as possible for a visit in 2 week(s).   Specialty: Orthopedic Surgery Contact information: [address] [place] [address] [phone]                 Signed: R. Zelda Kobs, PA-C Orthopedic Surgery 03/31/2024, 11:32 AM

## 2024-04-03 DIAGNOSIS — Z471 Aftercare following joint replacement surgery: Secondary | ICD-10-CM | POA: Diagnosis not present

## 2024-04-03 DIAGNOSIS — M6281 Muscle weakness (generalized): Secondary | ICD-10-CM | POA: Diagnosis not present

## 2024-04-03 DIAGNOSIS — M25561 Pain in right knee: Secondary | ICD-10-CM | POA: Diagnosis not present

## 2024-04-05 ENCOUNTER — Other Ambulatory Visit: Payer: Self-pay | Admitting: Cardiology

## 2024-04-05 DIAGNOSIS — E7800 Pure hypercholesterolemia, unspecified: Secondary | ICD-10-CM

## 2024-04-09 DIAGNOSIS — M25561 Pain in right knee: Secondary | ICD-10-CM | POA: Diagnosis not present

## 2024-04-09 DIAGNOSIS — Z471 Aftercare following joint replacement surgery: Secondary | ICD-10-CM | POA: Diagnosis not present

## 2024-04-09 DIAGNOSIS — M6281 Muscle weakness (generalized): Secondary | ICD-10-CM | POA: Diagnosis not present

## 2024-04-11 DIAGNOSIS — M6281 Muscle weakness (generalized): Secondary | ICD-10-CM | POA: Diagnosis not present

## 2024-04-11 DIAGNOSIS — M25561 Pain in right knee: Secondary | ICD-10-CM | POA: Diagnosis not present

## 2024-04-11 DIAGNOSIS — Z471 Aftercare following joint replacement surgery: Secondary | ICD-10-CM | POA: Diagnosis not present

## 2024-04-15 DIAGNOSIS — M6281 Muscle weakness (generalized): Secondary | ICD-10-CM | POA: Diagnosis not present

## 2024-04-15 DIAGNOSIS — M25561 Pain in right knee: Secondary | ICD-10-CM | POA: Diagnosis not present

## 2024-04-15 DIAGNOSIS — Z471 Aftercare following joint replacement surgery: Secondary | ICD-10-CM | POA: Diagnosis not present

## 2024-04-17 DIAGNOSIS — M25561 Pain in right knee: Secondary | ICD-10-CM | POA: Diagnosis not present

## 2024-04-17 DIAGNOSIS — Z471 Aftercare following joint replacement surgery: Secondary | ICD-10-CM | POA: Diagnosis not present

## 2024-04-17 DIAGNOSIS — M6281 Muscle weakness (generalized): Secondary | ICD-10-CM | POA: Diagnosis not present

## 2024-04-17 NOTE — Progress Notes (Deleted)
 HPI: FU CHF.  Patient apparently had cardiac catheterization in 2005 showing normal coronary arteries.  Carotid Dopplers May 2020 showed 1 to 39% bilateral stenosis.  Echocardiogram May 2023 showed normal LV function and grade 1 diastolic dysfunction.  Patient also carries a history of chronic diastolic congestive heart failure.  Coronary CTA April 2024 showed mild disease in the right coronary artery, minimal plaque in the LAD, moderate stenosis in the circumflex, calcium  score 15th which was 67 percentile.  FFR negative.  Since last seen,   Current Outpatient Medications  Medication Sig Dispense Refill   acetaminophen  (TYLENOL ) 500 MG tablet Take 500-1,000 mg by mouth every 6 (six) hours as needed for headache.     ALPRAZolam  (XANAX ) 1 MG tablet Take 1 mg by mouth 3 (three) times daily as needed for anxiety.     atorvastatin  (LIPITOR) 80 MG tablet TAKE ONE TABLET BY MOUTH EVERY DAY 90 tablet 3   buPROPion  (WELLBUTRIN  XL) 150 MG 24 hr tablet Take 150 mg by mouth every morning.     chlorhexidine  (HIBICLENS ) 4 % external liquid Apply 15 mLs (1 Application total) topically as directed for 30 doses. Use as directed daily for 5 days every other week for 6 weeks. 946 mL 1   Choline Fenofibrate (FENOFIBRIC ACID) 135 MG CPDR Take 135 mg by mouth at bedtime.     citalopram  (CELEXA ) 40 MG tablet Take 40 mg by mouth daily.     cyclobenzaprine  (FLEXERIL ) 10 MG tablet Take 1 tablet (10 mg total) by mouth 3 (three) times daily as needed for muscle spasms. 30 tablet 0   ezetimibe  (ZETIA ) 10 MG tablet Take 1 tablet (10 mg total) by mouth daily. 90 tablet 3   furosemide  (LASIX ) 20 MG tablet Twice daily as needed if weight >3 Lbs 180 tablet 0   gabapentin  (NEURONTIN ) 400 MG capsule Take 400 mg by mouth 2 (two) times daily.     glimepiride  (AMARYL ) 4 MG tablet Take 4 mg by mouth 2 (two) times daily.     HYDROmorphone  (DILAUDID ) 2 MG tablet Take 1-2 tablets (2-4 mg total) by mouth every 6 (six) hours as needed  for severe pain (pain score 7-10). 42 tablet 0   JARDIANCE  10 MG TABS tablet Take 10 mg by mouth daily.     LANTUS  SOLOSTAR 100 UNIT/ML Solostar Pen Inject 73 Units into the skin at bedtime.     metoprolol  succinate (TOPROL -XL) 25 MG 24 hr tablet TAKE 1 TABLET BY MOUTH DAILY.TAKE WITH OR IMMEDIATELY FOLLOWING A MEAL 90 tablet 3   mupirocin  ointment (BACTROBAN ) 2 % Place 1 Application into the nose 2 (two) times daily for 60 doses. Use as directed 2 times daily for 5 days every other week for 6 weeks. 66 g 0   omeprazole (PRILOSEC) 20 MG capsule Take 20 mg by mouth at bedtime.   9   ondansetron  (ZOFRAN ) 4 MG tablet Take 1 tablet (4 mg total) by mouth every 6 (six) hours as needed for nausea. 20 tablet 0   OZEMPIC, 2 MG/DOSE, 8 MG/3ML SOPN Inject 2 mLs into the skin every Sunday.     potassium chloride  SA (KLOR-CON ) 20 MEQ tablet Take 20 mEq by mouth daily as needed (With Furosemide ). 180 tablet 1   spironolactone  (ALDACTONE ) 50 MG tablet TAKE ONE TABLET BY MOUTH EVERY DAY IN THE MORNING 90 tablet 3   verapamil  (CALAN -SR) 120 MG CR tablet Take 120 mg by mouth at bedtime.   11  No current facility-administered medications for this visit.     Past Medical History:  Diagnosis Date   Abnormal EKG    Allergy    Anxiety    PTSD   Arthritis    CHF (congestive heart failure) (HCC)    Depression    Diabetes mellitus without complication (HCC)    Dyspnea    Fatty liver    GERD (gastroesophageal reflux disease)    History of kidney stones    Hx of absence seizures    last seizure- 1978   Hyperlipidemia    Hypertension    Unspecified   IBS (irritable bowel syndrome)    Panic attacks    severe panic attacks followed by DR jenel when happens cannot speak   Panic disorder 03/20/2019   Pneumonia    Seizures (HCC)    Reports last seizure was 30 + years ago    Sleep apnea    no CPAP worn   Stroke (HCC)    2004  slight right eye droop    Past Surgical History:  Procedure Laterality Date    HYSTEROSCOPY  2001   submucous myomectomy   TOTAL ABDOMINAL HYSTERECTOMY  2002   TAH BSO  pathology showed leiomyoma, adenomyosis, simple and complex hyperplasia without atypia   TOTAL KNEE ARTHROPLASTY Left 02/19/2023   Procedure: TOTAL KNEE ARTHROPLASTY;  Surgeon: Melodi Lerner, MD;  Location: [facility];  Service: Orthopedics;  Laterality: Left;   TOTAL KNEE ARTHROPLASTY Right 03/24/2024   Procedure: ARTHROPLASTY, KNEE, TOTAL;  Surgeon: Melodi Lerner, MD;  Location: [facility];  Service: Orthopedics;  Laterality: Right;   TUMOR REMOVAL     from left ear    Social History   Socioeconomic History   Marital status: Married    Spouse name: Not on file   Number of children: 0   Years of education: Not on file   Highest education level: Some college, no degree  Occupational History   Occupation: Full Time    Comment: Sales  Tobacco Use   Smoking status: Never   Smokeless tobacco: Never  Vaping Use   Vaping status: Never Used  Substance and Sexual Activity   Alcohol use: No    Alcohol/week: 0.0 standard drinks of alcohol   Drug use: No   Sexual activity: Not Currently    Comment: 1st intercourse 63 yo-Fewer than 5 partners  Other Topics Concern   Not on file  Social History Narrative   Right handed   Caffeine 1-2 cups daily    Lives at home with husband with 2 dogs    Regular exercise: No   Social Drivers of Corporate investment banker Strain: Not on file  Food Insecurity: No Food Insecurity (03/24/2024)   Hunger Vital Sign    Worried About Running Out of Food in the Last Year: Never true    Ran Out of Food in the Last Year: Never true  Transportation Needs: No Transportation Needs (03/24/2024)   PRAPARE - Administrator, Civil Service (Medical): No    Lack of Transportation (Non-Medical): No  Physical Activity: Not on file  Stress: Not on file  Social Connections: Not on file  Intimate Partner Violence: Not At Risk (03/24/2024)   Humiliation, Afraid, Rape, and  Kick questionnaire    Fear of Current or Ex-Partner: No    Emotionally Abused: No    Physically Abused: No    Sexually Abused: No    Family History  Problem Relation Age of Onset  Uterine cancer Mother    Irritable bowel syndrome Mother    Stroke Mother    Osteoporosis Mother    Cancer Father        Prostate   High blood pressure Father    Heart attack Brother 32   Cancer Brother        Prostate   Heart disease Maternal Grandmother    Coronary artery disease Paternal Grandfather    Colitis Neg Hx    Esophageal cancer Neg Hx    Stomach cancer Neg Hx    Rectal cancer Neg Hx    Breast cancer Neg Hx    Colon cancer Neg Hx    Colon polyps Neg Hx     ROS: no fevers or chills, productive cough, hemoptysis, dysphasia, odynophagia, melena, hematochezia, dysuria, hematuria, rash, seizure activity, orthopnea, PND, pedal edema, claudication. Remaining systems are negative.  Physical Exam: Well-developed well-nourished in no acute distress.  Skin is warm and dry.  HEENT is normal.  Neck is supple.  Chest is clear to auscultation with normal expansion.  Cardiovascular exam is regular rate and rhythm.  Abdominal exam nontender or distended. No masses palpated. Extremities show no edema. neuro grossly intact  ECG- personally reviewed  A/P  1 coronary artery disease-continue statin.  2 chronic diastolic congestive heart failure-continue diuretics at present dose.  Continue Jardiance .  3 hyperlipidemia-continue Zetia  and Lipitor.  4 obstructive sleep apnea-I stressed compliance with CPAP.  Redell Shallow, MD

## 2024-04-22 DIAGNOSIS — M25561 Pain in right knee: Secondary | ICD-10-CM | POA: Diagnosis not present

## 2024-04-22 DIAGNOSIS — Z471 Aftercare following joint replacement surgery: Secondary | ICD-10-CM | POA: Diagnosis not present

## 2024-04-22 DIAGNOSIS — M6281 Muscle weakness (generalized): Secondary | ICD-10-CM | POA: Diagnosis not present

## 2024-04-24 DIAGNOSIS — M6281 Muscle weakness (generalized): Secondary | ICD-10-CM | POA: Diagnosis not present

## 2024-04-24 DIAGNOSIS — M25561 Pain in right knee: Secondary | ICD-10-CM | POA: Diagnosis not present

## 2024-04-24 DIAGNOSIS — Z471 Aftercare following joint replacement surgery: Secondary | ICD-10-CM | POA: Diagnosis not present

## 2024-04-25 ENCOUNTER — Ambulatory Visit: Admitting: Cardiology

## 2024-06-30 ENCOUNTER — Other Ambulatory Visit: Payer: Self-pay | Admitting: Cardiology

## 2024-06-30 DIAGNOSIS — E7800 Pure hypercholesterolemia, unspecified: Secondary | ICD-10-CM

## 2024-07-29 DIAGNOSIS — Z5181 Encounter for therapeutic drug level monitoring: Secondary | ICD-10-CM | POA: Diagnosis not present

## 2024-09-24 NOTE — Progress Notes (Unsigned)
 "    HPI: FU CHF. Patient apparently had cardiac catheterization in 2005 showing normal coronary arteries.  Carotid Dopplers May 2020 showed 1 to 39% bilateral stenosis.  Nuclear study May 2023 showed ejection fraction 69% and normal perfusion.  Echocardiogram May 2023 showed normal LV function and grade 1 diastolic dysfunction.  Patient also carries a history of chronic diastolic congestive heart failure.  Coronary CTA April 2024 showed moderate stenosis in the circumflex, mild stenosis in the right coronary artery and minimal plaque in the LAD; calcium  score 15 which was 67 percentile; FFR negative.  Since last seen,   Current Outpatient Medications  Medication Sig Dispense Refill   acetaminophen  (TYLENOL ) 500 MG tablet Take 500-1,000 mg by mouth every 6 (six) hours as needed for headache.     ALPRAZolam  (XANAX ) 1 MG tablet Take 1 mg by mouth 3 (three) times daily as needed for anxiety.     atorvastatin  (LIPITOR) 80 MG tablet TAKE ONE TABLET BY MOUTH EVERY DAY 90 tablet 3   buPROPion  (WELLBUTRIN  XL) 150 MG 24 hr tablet Take 150 mg by mouth every morning.     chlorhexidine  (HIBICLENS ) 4 % external liquid Apply 15 mLs (1 Application total) topically as directed for 30 doses. Use as directed daily for 5 days every other week for 6 weeks. 946 mL 1   Choline Fenofibrate (FENOFIBRIC ACID) 135 MG CPDR Take 135 mg by mouth at bedtime.     citalopram  (CELEXA ) 40 MG tablet Take 40 mg by mouth daily.     cyclobenzaprine  (FLEXERIL ) 10 MG tablet Take 1 tablet (10 mg total) by mouth 3 (three) times daily as needed for muscle spasms. 30 tablet 0   ezetimibe  (ZETIA ) 10 MG tablet TAKE ONE TABLET BY MOUTH EVERY DAY 90 tablet 2   furosemide  (LASIX ) 20 MG tablet Twice daily as needed if weight >3 Lbs 180 tablet 0   gabapentin  (NEURONTIN ) 400 MG capsule Take 400 mg by mouth 2 (two) times daily.     glimepiride  (AMARYL ) 4 MG tablet Take 4 mg by mouth 2 (two) times daily.     HYDROmorphone  (DILAUDID ) 2 MG tablet Take  1-2 tablets (2-4 mg total) by mouth every 6 (six) hours as needed for severe pain (pain score 7-10). 42 tablet 0   JARDIANCE  10 MG TABS tablet Take 10 mg by mouth daily.     LANTUS  SOLOSTAR 100 UNIT/ML Solostar Pen Inject 73 Units into the skin at bedtime.     metoprolol  succinate (TOPROL -XL) 25 MG 24 hr tablet TAKE 1 TABLET BY MOUTH DAILY.TAKE WITH OR IMMEDIATELY FOLLOWING A MEAL 90 tablet 3   omeprazole (PRILOSEC) 20 MG capsule Take 20 mg by mouth at bedtime.   9   ondansetron  (ZOFRAN ) 4 MG tablet Take 1 tablet (4 mg total) by mouth every 6 (six) hours as needed for nausea. 20 tablet 0   OZEMPIC, 2 MG/DOSE, 8 MG/3ML SOPN Inject 2 mLs into the skin every Sunday.     potassium chloride  SA (KLOR-CON ) 20 MEQ tablet Take 20 mEq by mouth daily as needed (With Furosemide ). 180 tablet 1   spironolactone  (ALDACTONE ) 50 MG tablet TAKE ONE TABLET BY MOUTH EVERY DAY IN THE MORNING 90 tablet 3   verapamil  (CALAN -SR) 120 MG CR tablet Take 120 mg by mouth at bedtime.   11   No current facility-administered medications for this visit.     Past Medical History:  Diagnosis Date   Abnormal EKG    Allergy  Anxiety    PTSD   Arthritis    CHF (congestive heart failure) (HCC)    Depression    Diabetes mellitus without complication (HCC)    Dyspnea    Fatty liver    GERD (gastroesophageal reflux disease)    History of kidney stones    Hx of absence seizures    last seizure- 1978   Hyperlipidemia    Hypertension    Unspecified   IBS (irritable bowel syndrome)    Panic attacks    severe panic attacks followed by DR jenel when happens cannot speak   Panic disorder 03/20/2019   Pneumonia    Seizures (HCC)    Reports last seizure was 30 + years ago    Sleep apnea    no CPAP worn   Stroke [facility])    2004  slight right eye droop    Past Surgical History:  Procedure Laterality Date   HYSTEROSCOPY  2001   submucous myomectomy   TOTAL ABDOMINAL HYSTERECTOMY  2002   TAH BSO  pathology showed  leiomyoma, adenomyosis, simple and complex hyperplasia without atypia   TOTAL KNEE ARTHROPLASTY Left 02/19/2023   Procedure: TOTAL KNEE ARTHROPLASTY;  Surgeon: Melodi Lerner, MD;  Location: [facility];  Service: Orthopedics;  Laterality: Left;   TOTAL KNEE ARTHROPLASTY Right 03/24/2024   Procedure: ARTHROPLASTY, KNEE, TOTAL;  Surgeon: Melodi Lerner, MD;  Location: [facility];  Service: Orthopedics;  Laterality: Right;   TUMOR REMOVAL     from left ear    Social History   Socioeconomic History   Marital status: Married    Spouse name: Not on file   Number of children: 0   Years of education: Not on file   Highest education level: Some college, no degree  Occupational History   Occupation: Full Time    Comment: Sales  Tobacco Use   Smoking status: Never   Smokeless tobacco: Never  Vaping Use   Vaping status: Never Used  Substance and Sexual Activity   Alcohol use: No    Alcohol/week: 0.0 standard drinks of alcohol   Drug use: No   Sexual activity: Not Currently    Comment: 1st intercourse 64 yo-Fewer than 5 partners  Other Topics Concern   Not on file  Social History Narrative   Right handed   Caffeine 1-2 cups daily    Lives at home with husband with 2 dogs    Regular exercise: No   Social Drivers of Health   Tobacco Use: Low Risk (03/24/2024)   Patient History    Smoking Tobacco Use: Never    Smokeless Tobacco Use: Never    Passive Exposure: Not on file  Financial Resource Strain: Not on file  Food Insecurity: No Food Insecurity (03/24/2024)   Epic    Worried About Programme Researcher, Broadcasting/film/video in the Last Year: Never true    Ran Out of Food in the Last Year: Never true  Transportation Needs: No Transportation Needs (03/24/2024)   Epic    Lack of Transportation (Medical): No    Lack of Transportation (Non-Medical): No  Physical Activity: Not on file  Stress: Not on file  Social Connections: Not on file  Intimate Partner Violence: Not At Risk (03/24/2024)   Epic    Fear of  Current or Ex-Partner: No    Emotionally Abused: No    Physically Abused: No    Sexually Abused: No  Depression (PHQ2-9): Medium Risk (11/07/2021)   Depression (PHQ2-9)    PHQ-2 Score: 9  Alcohol Screen: Not on file  Housing: Low Risk (03/24/2024)   Epic    Unable to Pay for Housing in the Last Year: No    Number of Times Moved in the Last Year: 0    Homeless in the Last Year: No  Utilities: Not At Risk (03/24/2024)   Epic    Threatened with loss of utilities: No  Health Literacy: Not on file    Family History  Problem Relation Age of Onset   Uterine cancer Mother    Irritable bowel syndrome Mother    Stroke Mother    Osteoporosis Mother    Cancer Father        Prostate   High blood pressure Father    Heart attack Brother 34   Cancer Brother        Prostate   Heart disease Maternal Grandmother    Coronary artery disease Paternal Grandfather    Colitis Neg Hx    Esophageal cancer Neg Hx    Stomach cancer Neg Hx    Rectal cancer Neg Hx    Breast cancer Neg Hx    Colon cancer Neg Hx    Colon polyps Neg Hx     ROS: no fevers or chills, productive cough, hemoptysis, dysphasia, odynophagia, melena, hematochezia, dysuria, hematuria, rash, seizure activity, orthopnea, PND, pedal edema, claudication. Remaining systems are negative.  Physical Exam: Well-developed well-nourished in no acute distress.  Skin is warm and dry.  HEENT is normal.  Neck is supple.  Chest is clear to auscultation with normal expansion.  Cardiovascular exam is regular rate and rhythm.  Abdominal exam nontender or distended. No masses palpated. Extremities show no edema. neuro grossly intact  ECG- personally reviewed  A/P  1 coronary artery disease-moderate disease in the circumflex and mild to the right coronary artery on previous CTA.  Plan medical therapy.  Continue aspirin  and statin.  2 chronic diastolic congestive heart failure-she is euvolemic today.  Continue spironolactone  at present  dose and Lasix  as needed.  3 hypertension-blood pressure is controlled.  Continue present medical regimen.  4 hyperlipidemia-continue statin.  5 sleep apnea-per Dr. Shlomo.  Redell Shallow, MD    "

## 2024-10-07 ENCOUNTER — Ambulatory Visit: Admitting: Cardiology

## 2024-10-09 ENCOUNTER — Telehealth: Payer: Self-pay | Admitting: Cardiology

## 2024-10-09 DIAGNOSIS — M7989 Other specified soft tissue disorders: Secondary | ICD-10-CM

## 2024-10-09 NOTE — Telephone Encounter (Signed)
 Call to patient to discuss symptoms, no answer. Left detailed VM per DPR asking patient to call us  back to discuss her concerns.

## 2024-10-09 NOTE — Telephone Encounter (Signed)
 Pt c/o swelling/edema: STAT if pt has developed SOB within 24 hours  If swelling, where is the swelling located? Legs   How much weight have you gained and in what time span?  6lbs in a couple weeks (just an estimate)  Have you gained 2 pounds in a day or 5 pounds in a week? No   Do you have a log of your daily weights (if so, list)? Does not weigh everyday   Are you currently taking a fluid pill? Yes   Are you currently SOB? No   Have you traveled recently in a car or plane for an extended period of time? No

## 2024-10-10 NOTE — Telephone Encounter (Signed)
Pt called back to follow-up please advise.

## 2024-10-10 NOTE — Telephone Encounter (Signed)
 Spoke with pt reports had a right knee replacement and had f/u with surgeon.  Was told that right calf was swollen and tight.  Was told to f/u with Cardiology.   Has to sleep propped up.  Pt has gained 6 lbs in what she thinks is about a week or 2; doesn't weigh daily.  Took a PRN furosemide  last night and has dropped 3 lbs.  Continues to note swelling and tightness to right calf.  Notes left side is also swollen but not as bad.   Advised her to take furosemide  for 2 more days to see if helps with swelling.  Denies pain to calf.  Asked if surgeon mentioned blood clot; pt reports he didn't think it was a clot.  Surgeon would like her seen prior to May.   Scheduled OV with MD for 10/17/24 at 9:20 am.  Will send to MD.

## 2024-10-10 NOTE — Telephone Encounter (Signed)
 Pt requesting a c/b from the nurse in regards to previous phone note

## 2024-10-13 ENCOUNTER — Other Ambulatory Visit: Payer: Self-pay

## 2024-10-13 DIAGNOSIS — E7800 Pure hypercholesterolemia, unspecified: Secondary | ICD-10-CM

## 2024-10-13 NOTE — Telephone Encounter (Signed)
 Spoke with pt, Aware of dr vertie recommendations.

## 2024-10-13 NOTE — Addendum Note (Signed)
 Addended by: Inaara Tye W on: 10/13/2024 04:04 PM   Modules accepted: Orders

## 2024-10-14 ENCOUNTER — Ambulatory Visit (HOSPITAL_COMMUNITY): Admission: RE | Admit: 2024-10-14 | Discharge: 2024-10-14 | Attending: Cardiology | Admitting: Cardiology

## 2024-10-14 ENCOUNTER — Ambulatory Visit: Payer: Self-pay | Admitting: Cardiology

## 2024-10-14 DIAGNOSIS — M7989 Other specified soft tissue disorders: Secondary | ICD-10-CM

## 2024-10-14 MED ORDER — ATORVASTATIN CALCIUM 80 MG PO TABS: 80.0000 mg | ORAL_TABLET | Freq: Every day | ORAL | 3 refills | Status: AC

## 2024-10-15 NOTE — Progress Notes (Unsigned)
 "    HPI: FU CHF.  Patient apparently had cardiac catheterization in 2005 showing normal coronary arteries.  Carotid Dopplers May 2020 showed 1 to 39% bilateral stenosis.  Nuclear study May 2023 showed ejection fraction 69% and normal perfusion.  Echocardiogram May 2023 showed normal LV function and grade 1 diastolic dysfunction.  Patient also carries a history of chronic diastolic congestive heart failure.  Coronary CTA April 2024 showed mild disease in the right coronary artery, moderate stenosis in the circumflex and minimal plaque in the LAD; calcium  score 15 which was 67th percentile.  FFR negative.  Venous Dopplers contacted the office recently after knee surgery with lower extremity edema and added to my schedule.  February 2026 showed no DVT on the right.  Since last seen,   Current Outpatient Medications  Medication Sig Dispense Refill   acetaminophen  (TYLENOL ) 500 MG tablet Take 500-1,000 mg by mouth every 6 (six) hours as needed for headache.     ALPRAZolam  (XANAX ) 1 MG tablet Take 1 mg by mouth 3 (three) times daily as needed for anxiety.     atorvastatin  (LIPITOR) 80 MG tablet Take 1 tablet (80 mg total) by mouth daily. 90 tablet 3   buPROPion  (WELLBUTRIN  XL) 150 MG 24 hr tablet Take 150 mg by mouth every morning.     chlorhexidine  (HIBICLENS ) 4 % external liquid Apply 15 mLs (1 Application total) topically as directed for 30 doses. Use as directed daily for 5 days every other week for 6 weeks. 946 mL 1   Choline Fenofibrate (FENOFIBRIC ACID) 135 MG CPDR Take 135 mg by mouth at bedtime.     citalopram  (CELEXA ) 40 MG tablet Take 40 mg by mouth daily.     cyclobenzaprine  (FLEXERIL ) 10 MG tablet Take 1 tablet (10 mg total) by mouth 3 (three) times daily as needed for muscle spasms. 30 tablet 0   ezetimibe  (ZETIA ) 10 MG tablet TAKE ONE TABLET BY MOUTH EVERY DAY 90 tablet 2   furosemide  (LASIX ) 20 MG tablet Twice daily as needed if weight >3 Lbs 180 tablet 0   gabapentin  (NEURONTIN ) 400 MG  capsule Take 400 mg by mouth 2 (two) times daily.     glimepiride  (AMARYL ) 4 MG tablet Take 4 mg by mouth 2 (two) times daily.     HYDROmorphone  (DILAUDID ) 2 MG tablet Take 1-2 tablets (2-4 mg total) by mouth every 6 (six) hours as needed for severe pain (pain score 7-10). 42 tablet 0   JARDIANCE  10 MG TABS tablet Take 10 mg by mouth daily.     LANTUS  SOLOSTAR 100 UNIT/ML Solostar Pen Inject 73 Units into the skin at bedtime.     metoprolol  succinate (TOPROL -XL) 25 MG 24 hr tablet TAKE 1 TABLET BY MOUTH DAILY.TAKE WITH OR IMMEDIATELY FOLLOWING A MEAL 90 tablet 3   omeprazole (PRILOSEC) 20 MG capsule Take 20 mg by mouth at bedtime.   9   ondansetron  (ZOFRAN ) 4 MG tablet Take 1 tablet (4 mg total) by mouth every 6 (six) hours as needed for nausea. 20 tablet 0   OZEMPIC, 2 MG/DOSE, 8 MG/3ML SOPN Inject 2 mLs into the skin every Sunday.     potassium chloride  SA (KLOR-CON ) 20 MEQ tablet Take 20 mEq by mouth daily as needed (With Furosemide ). 180 tablet 1   spironolactone  (ALDACTONE ) 50 MG tablet TAKE ONE TABLET BY MOUTH EVERY DAY IN THE MORNING 90 tablet 3   verapamil  (CALAN -SR) 120 MG CR tablet Take 120 mg by mouth at bedtime.  11   No current facility-administered medications for this visit.     Past Medical History:  Diagnosis Date   Abnormal EKG    Allergy    Anxiety    PTSD   Arthritis    CHF (congestive heart failure) (HCC)    Depression    Diabetes mellitus without complication (HCC)    Dyspnea    Fatty liver    GERD (gastroesophageal reflux disease)    History of kidney stones    Hx of absence seizures    last seizure- 1978   Hyperlipidemia    Hypertension    Unspecified   IBS (irritable bowel syndrome)    Panic attacks    severe panic attacks followed by DR jenel when happens cannot speak   Panic disorder 03/20/2019   Pneumonia    Seizures (HCC)    Reports last seizure was 30 + years ago    Sleep apnea    no CPAP worn   Stroke (HCC)    2004  slight right eye droop     Past Surgical History:  Procedure Laterality Date   HYSTEROSCOPY  2001   submucous myomectomy   TOTAL ABDOMINAL HYSTERECTOMY  2002   TAH BSO  pathology showed leiomyoma, adenomyosis, simple and complex hyperplasia without atypia   TOTAL KNEE ARTHROPLASTY Left 02/19/2023   Procedure: TOTAL KNEE ARTHROPLASTY;  Surgeon: Melodi Lerner, MD;  Location: [facility];  Service: Orthopedics;  Laterality: Left;   TOTAL KNEE ARTHROPLASTY Right 03/24/2024   Procedure: ARTHROPLASTY, KNEE, TOTAL;  Surgeon: Melodi Lerner, MD;  Location: [facility];  Service: Orthopedics;  Laterality: Right;   TUMOR REMOVAL     from left ear    Social History   Socioeconomic History   Marital status: Married    Spouse name: Not on file   Number of children: 0   Years of education: Not on file   Highest education level: Some college, no degree  Occupational History   Occupation: Full Time    Comment: Sales  Tobacco Use   Smoking status: Never   Smokeless tobacco: Never  Vaping Use   Vaping status: Never Used  Substance and Sexual Activity   Alcohol use: No    Alcohol/week: 0.0 standard drinks of alcohol   Drug use: No   Sexual activity: Not Currently    Comment: 1st intercourse 64 yo-Fewer than 5 partners  Other Topics Concern   Not on file  Social History Narrative   Right handed   Caffeine 1-2 cups daily    Lives at home with husband with 2 dogs    Regular exercise: No   Social Drivers of Health   Tobacco Use: Low Risk (03/24/2024)   Patient History    Smoking Tobacco Use: Never    Smokeless Tobacco Use: Never    Passive Exposure: Not on file  Financial Resource Strain: Not on file  Food Insecurity: No Food Insecurity (03/24/2024)   Epic    Worried About Programme Researcher, Broadcasting/film/video in the Last Year: Never true    Ran Out of Food in the Last Year: Never true  Transportation Needs: No Transportation Needs (03/24/2024)   Epic    Lack of Transportation (Medical): No    Lack of Transportation (Non-Medical):  No  Physical Activity: Not on file  Stress: Not on file  Social Connections: Not on file  Intimate Partner Violence: Not At Risk (03/24/2024)   Epic    Fear of Current or Ex-Partner: No  Emotionally Abused: No    Physically Abused: No    Sexually Abused: No  Depression (PHQ2-9): Medium Risk (11/07/2021)   Depression (PHQ2-9)    PHQ-2 Score: 9  Alcohol Screen: Not on file  Housing: Low Risk (03/24/2024)   Epic    Unable to Pay for Housing in the Last Year: No    Number of Times Moved in the Last Year: 0    Homeless in the Last Year: No  Utilities: Not At Risk (03/24/2024)   Epic    Threatened with loss of utilities: No  Health Literacy: Not on file    Family History  Problem Relation Age of Onset   Uterine cancer Mother    Irritable bowel syndrome Mother    Stroke Mother    Osteoporosis Mother    Cancer Father        Prostate   High blood pressure Father    Heart attack Brother 58   Cancer Brother        Prostate   Heart disease Maternal Grandmother    Coronary artery disease Paternal Grandfather    Colitis Neg Hx    Esophageal cancer Neg Hx    Stomach cancer Neg Hx    Rectal cancer Neg Hx    Breast cancer Neg Hx    Colon cancer Neg Hx    Colon polyps Neg Hx     ROS: no fevers or chills, productive cough, hemoptysis, dysphasia, odynophagia, melena, hematochezia, dysuria, hematuria, rash, seizure activity, orthopnea, PND, pedal edema, claudication. Remaining systems are negative.  Physical Exam: Well-developed well-nourished in no acute distress.  Skin is warm and dry.  HEENT is normal.  Neck is supple.  Chest is clear to auscultation with normal expansion.  Cardiovascular exam is regular rate and rhythm.  Abdominal exam nontender or distended. No masses palpated. Extremities show no edema. neuro grossly intact  ECG- personally reviewed  A/P  1 lower extremity edema-venous Dopplers showed no DVT on the right.  2 chronic diastolic congestive heart  failure-  3 hypertension-patient's blood pressure is controlled.  Continue present medical regimen.  4 hyperlipidemia-continue statin.  5 coronary artery disease-  6 history of sleep apnea-per Dr. Shlomo.  Redell Shallow, MD    "

## 2024-10-16 ENCOUNTER — Other Ambulatory Visit (HOSPITAL_COMMUNITY): Payer: Self-pay

## 2024-10-17 ENCOUNTER — Ambulatory Visit: Admitting: Cardiology

## 2024-10-17 ENCOUNTER — Encounter: Payer: Self-pay | Admitting: Cardiology

## 2024-10-17 VITALS — BP 140/82 | HR 61 | Ht 60.0 in | Wt 165.8 lb

## 2024-10-17 DIAGNOSIS — E7800 Pure hypercholesterolemia, unspecified: Secondary | ICD-10-CM

## 2024-10-17 DIAGNOSIS — I5032 Chronic diastolic (congestive) heart failure: Secondary | ICD-10-CM

## 2024-10-17 DIAGNOSIS — M7989 Other specified soft tissue disorders: Secondary | ICD-10-CM

## 2024-10-17 DIAGNOSIS — I10 Essential (primary) hypertension: Secondary | ICD-10-CM

## 2024-10-17 DIAGNOSIS — E785 Hyperlipidemia, unspecified: Secondary | ICD-10-CM

## 2024-10-17 DIAGNOSIS — R0602 Shortness of breath: Secondary | ICD-10-CM

## 2024-10-17 DIAGNOSIS — I2510 Atherosclerotic heart disease of native coronary artery without angina pectoris: Secondary | ICD-10-CM

## 2024-10-17 DIAGNOSIS — G4733 Obstructive sleep apnea (adult) (pediatric): Secondary | ICD-10-CM

## 2024-10-17 DIAGNOSIS — R079 Chest pain, unspecified: Secondary | ICD-10-CM

## 2024-10-17 MED ORDER — FUROSEMIDE 20 MG PO TABS: 20.0000 mg | ORAL_TABLET | ORAL | 2 refills | Status: AC

## 2024-10-17 NOTE — Patient Instructions (Addendum)
 Medication Instructions:   Lasix  20 mg  every other day   *If you need a refill on your cardiac medications before your next appointment, please call your pharmacy*   Lab Work: In one week - fasting  Lipid LIVER PANEL  BMP BNP  If you have labs (blood work) drawn today and your tests are completely normal, you will receive your results only by: MyChart Message (if you have MyChart) OR A paper copy in the mail If you have any lab test that is abnormal or we need to change your treatment, we will call you to review the results.   Testing/Procedures: Your physician has requested that you have an echocardiogram. Echocardiography is a painless test that uses sound waves to create images of your heart. It provides your doctor with information about the size and shape of your heart and how well your hearts chambers and valves are working. This procedure takes approximately one hour. There are no restrictions for this procedure. Please do NOT wear cologne, perfume, aftershave, or lotions (deodorant is allowed). Please arrive 15 minutes prior to your appointment time.  Please note: We ask at that you not bring children with you during ultrasound (echo/ vascular) testing. Due to room size and safety concerns, children are not allowed in the ultrasound rooms during exams. Our front office staff cannot provide observation of children in our lobby area while testing is being conducted. An adult accompanying a patient to their appointment will only be allowed in the ultrasound room at the discretion of the ultrasound technician under special circumstances. We apologize for any inconvenience.    Follow-Up: At [facility], you and your health needs are our priority.  As part of our continuing mission to provide you with exceptional heart care, we have created designated Provider Care Teams.  These Care Teams include your primary Cardiologist (physician) and Advanced Practice Providers (APPs -   Physician Assistants and Nurse Practitioners) who all work together to provide you with the care you need, when you need it.     Your next appointment:   6 month(s)  The format for your next appointment:   In Person  Provider:   Redell Shallow, MD   Other Instructions

## 2024-11-25 ENCOUNTER — Other Ambulatory Visit (HOSPITAL_COMMUNITY)

## 2025-01-28 ENCOUNTER — Ambulatory Visit: Admitting: Cardiology
